# Patient Record
Sex: Female | Born: 1945
Health system: Southern US, Community
[De-identification: ages and names within clinical notes are randomized; demographics above are authoritative.]

## PROBLEM LIST (undated history)

## (undated) ENCOUNTER — Emergency Department (HOSPITAL_COMMUNITY): Payer: Medicare Other | Source: Home / Self Care

## (undated) DIAGNOSIS — M1990 Unspecified osteoarthritis, unspecified site: Secondary | ICD-10-CM

## (undated) DIAGNOSIS — Z9221 Personal history of antineoplastic chemotherapy: Secondary | ICD-10-CM

## (undated) DIAGNOSIS — Z974 Presence of external hearing-aid: Secondary | ICD-10-CM

## (undated) DIAGNOSIS — K5900 Constipation, unspecified: Secondary | ICD-10-CM

## (undated) DIAGNOSIS — Z8489 Family history of other specified conditions: Secondary | ICD-10-CM

## (undated) DIAGNOSIS — K219 Gastro-esophageal reflux disease without esophagitis: Secondary | ICD-10-CM

## (undated) DIAGNOSIS — J45909 Unspecified asthma, uncomplicated: Secondary | ICD-10-CM

## (undated) DIAGNOSIS — M5430 Sciatica, unspecified side: Secondary | ICD-10-CM

## (undated) DIAGNOSIS — Z973 Presence of spectacles and contact lenses: Secondary | ICD-10-CM

## (undated) DIAGNOSIS — I639 Cerebral infarction, unspecified: Secondary | ICD-10-CM

## (undated) DIAGNOSIS — F32A Depression, unspecified: Secondary | ICD-10-CM

## (undated) DIAGNOSIS — F329 Major depressive disorder, single episode, unspecified: Secondary | ICD-10-CM

## (undated) DIAGNOSIS — F419 Anxiety disorder, unspecified: Secondary | ICD-10-CM

## (undated) DIAGNOSIS — J449 Chronic obstructive pulmonary disease, unspecified: Secondary | ICD-10-CM

## (undated) DIAGNOSIS — R51 Headache: Secondary | ICD-10-CM

## (undated) DIAGNOSIS — N189 Chronic kidney disease, unspecified: Secondary | ICD-10-CM

## (undated) DIAGNOSIS — H9190 Unspecified hearing loss, unspecified ear: Secondary | ICD-10-CM

## (undated) DIAGNOSIS — R519 Headache, unspecified: Secondary | ICD-10-CM

## (undated) DIAGNOSIS — I10 Essential (primary) hypertension: Secondary | ICD-10-CM

## (undated) DIAGNOSIS — G589 Mononeuropathy, unspecified: Secondary | ICD-10-CM

## (undated) DIAGNOSIS — R0600 Dyspnea, unspecified: Secondary | ICD-10-CM

## (undated) DIAGNOSIS — C50919 Malignant neoplasm of unspecified site of unspecified female breast: Secondary | ICD-10-CM

## (undated) DIAGNOSIS — C50911 Malignant neoplasm of unspecified site of right female breast: Secondary | ICD-10-CM

## (undated) DIAGNOSIS — A159 Respiratory tuberculosis unspecified: Secondary | ICD-10-CM

## (undated) DIAGNOSIS — I739 Peripheral vascular disease, unspecified: Secondary | ICD-10-CM

## (undated) DIAGNOSIS — S149XXA Injury of unspecified nerves of neck, initial encounter: Secondary | ICD-10-CM

## (undated) DIAGNOSIS — J439 Emphysema, unspecified: Secondary | ICD-10-CM

## (undated) HISTORY — PX: BREAST SURGERY: SHX581

## (undated) HISTORY — DX: Malignant neoplasm of unspecified site of unspecified female breast: C50.919

## (undated) HISTORY — PX: ABDOMINAL HYSTERECTOMY: SHX81

## (undated) HISTORY — PX: OTHER SURGICAL HISTORY: SHX169

## (undated) HISTORY — PX: INNER EAR SURGERY: SHX679

## (undated) HISTORY — DX: Malignant neoplasm of unspecified site of right female breast: C50.911

## (undated) HISTORY — PX: APPENDECTOMY: SHX54

---

## 1999-03-02 ENCOUNTER — Ambulatory Visit (HOSPITAL_COMMUNITY): Admission: RE | Admit: 1999-03-02 | Discharge: 1999-03-02 | Payer: Self-pay | Admitting: Infectious Diseases

## 2003-05-18 ENCOUNTER — Emergency Department (HOSPITAL_COMMUNITY): Admission: EM | Admit: 2003-05-18 | Discharge: 2003-05-18 | Payer: Self-pay | Admitting: Emergency Medicine

## 2003-05-28 ENCOUNTER — Inpatient Hospital Stay (HOSPITAL_COMMUNITY): Admission: EM | Admit: 2003-05-28 | Discharge: 2003-06-07 | Payer: Self-pay | Admitting: *Deleted

## 2011-07-23 ENCOUNTER — Emergency Department (HOSPITAL_COMMUNITY): Payer: Medicare Other

## 2011-07-23 ENCOUNTER — Emergency Department (HOSPITAL_COMMUNITY)
Admission: EM | Admit: 2011-07-23 | Discharge: 2011-07-23 | Disposition: A | Discharge status: Home / Self Care | Payer: Medicare Other | Attending: Emergency Medicine | Admitting: Emergency Medicine

## 2011-07-23 ENCOUNTER — Encounter (HOSPITAL_COMMUNITY): Payer: Self-pay

## 2011-07-23 DIAGNOSIS — M5430 Sciatica, unspecified side: Secondary | ICD-10-CM

## 2011-07-23 DIAGNOSIS — Z8673 Personal history of transient ischemic attack (TIA), and cerebral infarction without residual deficits: Secondary | ICD-10-CM | POA: Insufficient documentation

## 2011-07-23 DIAGNOSIS — I10 Essential (primary) hypertension: Secondary | ICD-10-CM | POA: Insufficient documentation

## 2011-07-23 DIAGNOSIS — F17200 Nicotine dependence, unspecified, uncomplicated: Secondary | ICD-10-CM | POA: Insufficient documentation

## 2011-07-23 DIAGNOSIS — J449 Chronic obstructive pulmonary disease, unspecified: Secondary | ICD-10-CM | POA: Insufficient documentation

## 2011-07-23 DIAGNOSIS — M25559 Pain in unspecified hip: Secondary | ICD-10-CM | POA: Insufficient documentation

## 2011-07-23 DIAGNOSIS — J4489 Other specified chronic obstructive pulmonary disease: Secondary | ICD-10-CM | POA: Insufficient documentation

## 2011-07-23 HISTORY — DX: Essential (primary) hypertension: I10

## 2011-07-23 HISTORY — DX: Chronic obstructive pulmonary disease, unspecified: J44.9

## 2011-07-23 HISTORY — DX: Cerebral infarction, unspecified: I63.9

## 2011-07-23 MED ORDER — OXYCODONE-ACETAMINOPHEN 5-325 MG PO TABS: 1.0000 | ORAL_TABLET | Freq: Four times a day (QID) | ORAL | Status: AC | PRN

## 2011-07-23 NOTE — ED Provider Notes (Signed)
History     CSN: [id number]  Arrival date & time 07/23/11  1115   First MD Initiated Contact with Patient 07/23/11 1212      Chief Complaint  Patient presents with  . Hip Pain    (Consider location/radiation/quality/duration/timing/severity/associated sxs/prior treatment) Patient is a 66 y.o. female presenting with hip pain. The history is provided by the patient.  Hip Pain This is a new problem. Pertinent negatives include no chest pain and no abdominal pain.   patient has had pain in her right hip or back the last 2 weeks. There is no trauma. There is no relief with her Norco at home. It radiates down the back of her right leg. No numbness or weakness. No urinary or fecal incontinence or retention. She states she has a history of a bulging disc. No rash.  Past Medical History  Diagnosis Date  . Stroke   . Hypertension   . COPD (chronic obstructive pulmonary disease)     Past Surgical History  Procedure Date  . Abdominal hysterectomy   . Appendectomy     No family history on file.  History  Substance Use Topics  . Smoking status: Current Everyday Smoker  . Smokeless tobacco: Not on file  . Alcohol Use: No    OB History    Grav Para Term Preterm Abortions TAB SAB Ect Mult Living                  Review of Systems  Constitutional: Negative for fever and chills.  Cardiovascular: Negative for chest pain.  Gastrointestinal: Negative for nausea, abdominal pain, diarrhea, constipation and blood in stool.  Genitourinary: Negative for pelvic pain.  Musculoskeletal: Positive for back pain. Negative for joint swelling, arthralgias and gait problem.  Skin: Negative for pallor and rash.    Allergies  Review of patient's allergies indicates no known allergies.  Home Medications   Current Outpatient Rx  Name Route Sig Dispense Refill  . GOODY HEADACHE PO Oral Take 1 Package by mouth daily as needed. For headache    . CITALOPRAM HYDROBROMIDE 20 MG PO TABS Oral Take  20 mg by mouth daily.    Marland Kitchen HYDROCODONE-ACETAMINOPHEN 5-325 MG PO TABS Oral Take 1 tablet by mouth every 6 (six) hours as needed. For pain    . OXYCODONE-ACETAMINOPHEN 5-325 MG PO TABS Oral Take 1-2 tablets by mouth every 6 (six) hours as needed for pain. 15 tablet 0    BP 169/81  Pulse 85  Temp(Src) 97.6 F (36.4 C) (Oral)  Resp 16  Ht 5\' 2"  (1.575 m)  Wt 170 lb (77.111 kg)  BMI 31.09 kg/m2  SpO2 95%  Physical Exam  Nursing note and vitals reviewed. Constitutional: She is oriented to person, place, and time. She appears well-developed and well-nourished.  HENT:  Head: Normocephalic and atraumatic.  Eyes: EOM are normal. Pupils are equal, round, and reactive to light.  Neck: Normal range of motion. Neck supple.  Cardiovascular: Normal rate, regular rhythm and normal heart sounds.   No murmur heard. Pulmonary/Chest: Effort normal and breath sounds normal. No respiratory distress. She has no wheezes. She has no rales.  Abdominal: Soft. Bowel sounds are normal. She exhibits no distension. There is no tenderness. There is no rebound and no guarding.  Musculoskeletal: Normal range of motion.       Mild tenderness in the right SI joint area. No rash. Crepitance or deformity. Range of motion intact of right hip.  Neurological: She is alert and oriented  to person, place, and time. No cranial nerve deficit.  Skin: Skin is warm and dry.  Psychiatric: She has a normal mood and affect. Her speech is normal.    ED Course  Procedures (including critical care time)  Labs Reviewed - No data to display Dg Pelvis 1-2 Views  07/23/2011  *RADIOLOGY REPORT*  Clinical Data: Right hip pain.  PELVIS - 1-2 VIEW  Comparison: CT 05/28/2003  Findings: No acute bony abnormality.  Specifically, no fracture, subluxation, or dislocation.  Soft tissues are intact.  SI joints and hip joints are symmetric and unremarkable.  IMPRESSION: No acute bony abnormality.  Original Report Authenticated By: Cyndie Chime,  M.D.     1. Sciatica       MDM  Right hip/lower back pain. Tender over sciatic area. Negative pelvic x-ray. She be discharged home with pain meds and followup as needed.        Juliet Rude. Rubin Payor, MD 07/23/11 1331

## 2011-07-23 NOTE — ED Notes (Signed)
Rt. Hip pain that radiates into rt. Leg. Began about 2 weeks ago, denies any injury, denies any numbness or tingling

## 2011-07-23 NOTE — ED Notes (Signed)
Rt hip pain x 2 weeks no injury she states hurts to walk

## 2011-07-23 NOTE — Discharge Instructions (Signed)

## 2014-04-05 DIAGNOSIS — F331 Major depressive disorder, recurrent, moderate: Secondary | ICD-10-CM | POA: Diagnosis not present

## 2014-04-05 DIAGNOSIS — Z720 Tobacco use: Secondary | ICD-10-CM | POA: Diagnosis not present

## 2014-04-05 DIAGNOSIS — I10 Essential (primary) hypertension: Secondary | ICD-10-CM | POA: Diagnosis not present

## 2014-04-05 DIAGNOSIS — Z716 Tobacco abuse counseling: Secondary | ICD-10-CM | POA: Diagnosis not present

## 2015-03-26 DIAGNOSIS — M5430 Sciatica, unspecified side: Secondary | ICD-10-CM | POA: Diagnosis not present

## 2015-03-26 DIAGNOSIS — I10 Essential (primary) hypertension: Secondary | ICD-10-CM | POA: Diagnosis not present

## 2015-03-26 DIAGNOSIS — F331 Major depressive disorder, recurrent, moderate: Secondary | ICD-10-CM | POA: Diagnosis not present

## 2015-03-26 DIAGNOSIS — L0291 Cutaneous abscess, unspecified: Secondary | ICD-10-CM | POA: Diagnosis not present

## 2015-06-15 DIAGNOSIS — R7301 Impaired fasting glucose: Secondary | ICD-10-CM | POA: Diagnosis not present

## 2015-06-15 DIAGNOSIS — E782 Mixed hyperlipidemia: Secondary | ICD-10-CM | POA: Diagnosis not present

## 2015-06-15 DIAGNOSIS — I10 Essential (primary) hypertension: Secondary | ICD-10-CM | POA: Diagnosis not present

## 2015-06-17 DIAGNOSIS — F331 Major depressive disorder, recurrent, moderate: Secondary | ICD-10-CM | POA: Diagnosis not present

## 2015-06-17 DIAGNOSIS — R7301 Impaired fasting glucose: Secondary | ICD-10-CM | POA: Diagnosis not present

## 2015-06-17 DIAGNOSIS — Z720 Tobacco use: Secondary | ICD-10-CM | POA: Diagnosis not present

## 2015-06-17 DIAGNOSIS — I10 Essential (primary) hypertension: Secondary | ICD-10-CM | POA: Diagnosis not present

## 2015-06-17 DIAGNOSIS — H9193 Unspecified hearing loss, bilateral: Secondary | ICD-10-CM | POA: Diagnosis not present

## 2015-06-17 DIAGNOSIS — E782 Mixed hyperlipidemia: Secondary | ICD-10-CM | POA: Diagnosis not present

## 2015-09-22 DIAGNOSIS — R7301 Impaired fasting glucose: Secondary | ICD-10-CM | POA: Diagnosis not present

## 2015-09-22 DIAGNOSIS — E782 Mixed hyperlipidemia: Secondary | ICD-10-CM | POA: Diagnosis not present

## 2015-11-08 DIAGNOSIS — L02212 Cutaneous abscess of back [any part, except buttock]: Secondary | ICD-10-CM | POA: Diagnosis not present

## 2016-01-31 DIAGNOSIS — N63 Unspecified lump in breast: Secondary | ICD-10-CM | POA: Diagnosis not present

## 2016-01-31 DIAGNOSIS — M158 Other polyosteoarthritis: Secondary | ICD-10-CM | POA: Diagnosis not present

## 2016-01-31 DIAGNOSIS — Z23 Encounter for immunization: Secondary | ICD-10-CM | POA: Diagnosis not present

## 2016-02-15 ENCOUNTER — Other Ambulatory Visit (HOSPITAL_COMMUNITY): Payer: Self-pay | Admitting: Internal Medicine

## 2016-02-15 DIAGNOSIS — N6310 Unspecified lump in the right breast, unspecified quadrant: Secondary | ICD-10-CM

## 2016-02-15 DIAGNOSIS — N63 Unspecified lump in unspecified breast: Secondary | ICD-10-CM

## 2016-02-21 ENCOUNTER — Ambulatory Visit (HOSPITAL_COMMUNITY)
Admission: RE | Admit: 2016-02-21 | Discharge: 2016-02-21 | Disposition: A | Discharge status: Home / Self Care | Payer: Medicare Other | Source: Ambulatory Visit | Attending: Internal Medicine | Admitting: Internal Medicine

## 2016-02-21 ENCOUNTER — Other Ambulatory Visit (HOSPITAL_COMMUNITY): Payer: Self-pay | Admitting: Internal Medicine

## 2016-02-21 DIAGNOSIS — N6310 Unspecified lump in the right breast, unspecified quadrant: Secondary | ICD-10-CM

## 2016-02-21 DIAGNOSIS — R590 Localized enlarged lymph nodes: Secondary | ICD-10-CM | POA: Diagnosis not present

## 2016-02-21 DIAGNOSIS — N6311 Unspecified lump in the right breast, upper outer quadrant: Secondary | ICD-10-CM | POA: Insufficient documentation

## 2016-02-27 ENCOUNTER — Other Ambulatory Visit (HOSPITAL_COMMUNITY): Payer: Self-pay | Admitting: Internal Medicine

## 2016-02-27 DIAGNOSIS — N6310 Unspecified lump in the right breast, unspecified quadrant: Secondary | ICD-10-CM

## 2016-02-27 DIAGNOSIS — R599 Enlarged lymph nodes, unspecified: Secondary | ICD-10-CM

## 2016-02-28 ENCOUNTER — Ambulatory Visit (HOSPITAL_COMMUNITY)
Admission: RE | Admit: 2016-02-28 | Discharge: 2016-02-28 | Disposition: A | Discharge status: Home / Self Care | Payer: Medicare Other | Source: Ambulatory Visit | Attending: Internal Medicine | Admitting: Internal Medicine

## 2016-02-28 ENCOUNTER — Other Ambulatory Visit (HOSPITAL_COMMUNITY): Payer: Self-pay | Admitting: Internal Medicine

## 2016-02-28 ENCOUNTER — Encounter (HOSPITAL_COMMUNITY): Payer: Self-pay

## 2016-02-28 DIAGNOSIS — N6310 Unspecified lump in the right breast, unspecified quadrant: Secondary | ICD-10-CM

## 2016-02-28 DIAGNOSIS — R590 Localized enlarged lymph nodes: Secondary | ICD-10-CM | POA: Insufficient documentation

## 2016-02-28 DIAGNOSIS — C50411 Malignant neoplasm of upper-outer quadrant of right female breast: Secondary | ICD-10-CM | POA: Insufficient documentation

## 2016-02-28 DIAGNOSIS — R599 Enlarged lymph nodes, unspecified: Secondary | ICD-10-CM

## 2016-02-28 DIAGNOSIS — C773 Secondary and unspecified malignant neoplasm of axilla and upper limb lymph nodes: Secondary | ICD-10-CM | POA: Insufficient documentation

## 2016-02-28 DIAGNOSIS — N6311 Unspecified lump in the right breast, upper outer quadrant: Secondary | ICD-10-CM | POA: Diagnosis not present

## 2016-02-28 DIAGNOSIS — R897 Abnormal histological findings in specimens from other organs, systems and tissues: Secondary | ICD-10-CM

## 2016-02-28 MED ORDER — LIDOCAINE HCL (PF) 1 % IJ SOLN
INTRAMUSCULAR | Status: AC
  Filled 2016-02-28: qty 20

## 2016-02-28 MED ORDER — SODIUM BICARBONATE 4 % IV SOLN
INTRAVENOUS | Status: AC
  Filled 2016-02-28: qty 5

## 2016-03-07 ENCOUNTER — Encounter (HOSPITAL_COMMUNITY): Payer: Medicare Other

## 2016-03-07 ENCOUNTER — Encounter (HOSPITAL_COMMUNITY): Payer: Medicare Other | Attending: Oncology | Admitting: Oncology

## 2016-03-07 ENCOUNTER — Encounter (HOSPITAL_COMMUNITY): Payer: Self-pay | Admitting: Lab

## 2016-03-07 ENCOUNTER — Encounter (HOSPITAL_COMMUNITY): Payer: Self-pay | Admitting: Oncology

## 2016-03-07 VITALS — BP 144/64 | HR 85 | Temp 98.6°F | Resp 16 | Ht 62.0 in | Wt 188.0 lb

## 2016-03-07 DIAGNOSIS — Z8673 Personal history of transient ischemic attack (TIA), and cerebral infarction without residual deficits: Secondary | ICD-10-CM | POA: Insufficient documentation

## 2016-03-07 DIAGNOSIS — J449 Chronic obstructive pulmonary disease, unspecified: Secondary | ICD-10-CM | POA: Diagnosis not present

## 2016-03-07 DIAGNOSIS — C50411 Malignant neoplasm of upper-outer quadrant of right female breast: Secondary | ICD-10-CM | POA: Diagnosis not present

## 2016-03-07 DIAGNOSIS — Z720 Tobacco use: Secondary | ICD-10-CM

## 2016-03-07 DIAGNOSIS — I10 Essential (primary) hypertension: Secondary | ICD-10-CM | POA: Diagnosis not present

## 2016-03-07 DIAGNOSIS — E785 Hyperlipidemia, unspecified: Secondary | ICD-10-CM | POA: Diagnosis not present

## 2016-03-07 DIAGNOSIS — Z800 Family history of malignant neoplasm of digestive organs: Secondary | ICD-10-CM | POA: Diagnosis not present

## 2016-03-07 DIAGNOSIS — Z9221 Personal history of antineoplastic chemotherapy: Secondary | ICD-10-CM | POA: Diagnosis not present

## 2016-03-07 DIAGNOSIS — Z170 Estrogen receptor positive status [ER+]: Secondary | ICD-10-CM | POA: Diagnosis not present

## 2016-03-07 DIAGNOSIS — Z8052 Family history of malignant neoplasm of bladder: Secondary | ICD-10-CM | POA: Diagnosis not present

## 2016-03-07 DIAGNOSIS — C773 Secondary and unspecified malignant neoplasm of axilla and upper limb lymph nodes: Secondary | ICD-10-CM

## 2016-03-07 DIAGNOSIS — C50911 Malignant neoplasm of unspecified site of right female breast: Secondary | ICD-10-CM

## 2016-03-07 DIAGNOSIS — Z9889 Other specified postprocedural states: Secondary | ICD-10-CM | POA: Insufficient documentation

## 2016-03-07 DIAGNOSIS — F17210 Nicotine dependence, cigarettes, uncomplicated: Secondary | ICD-10-CM | POA: Insufficient documentation

## 2016-03-07 HISTORY — DX: Malignant neoplasm of unspecified site of right female breast: C50.911

## 2016-03-07 LAB — COMPREHENSIVE METABOLIC PANEL
ALT: 35 U/L (ref 14–54)
ANION GAP: 8 (ref 5–15)
AST: 31 U/L (ref 15–41)
Albumin: 4.3 g/dL (ref 3.5–5.0)
Alkaline Phosphatase: 87 U/L (ref 38–126)
BUN: 11 mg/dL (ref 6–20)
CO2: 28 mmol/L (ref 22–32)
CREATININE: 0.94 mg/dL (ref 0.44–1.00)
Calcium: 9.3 mg/dL (ref 8.9–10.3)
Chloride: 98 mmol/L — ABNORMAL LOW (ref 101–111)
GFR calc Af Amer: >60 mL/min (ref >60)
GFR calc non Af Amer: >60 mL/min (ref >60)
Glucose, Bld: 138 mg/dL — ABNORMAL HIGH (ref 65–99)
Potassium: 3.7 mmol/L (ref 3.5–5.1)
Sodium: 134 mmol/L — ABNORMAL LOW (ref 135–145)
TOTAL PROTEIN: 7.3 g/dL (ref 6.5–8.1)
Total Bilirubin: 0.4 mg/dL (ref 0.3–1.2)

## 2016-03-07 LAB — CBC WITH DIFFERENTIAL/PLATELET
Basophils Absolute: 0 10*3/uL (ref 0.0–0.1)
Basophils Relative: 1 %
EOS PCT: 3 %
Eosinophils Absolute: 0.2 10*3/uL (ref 0.0–0.7)
HCT: 42.8 % (ref 36.0–46.0)
Hemoglobin: 15.3 g/dL — ABNORMAL HIGH (ref 12.0–15.0)
LYMPHS ABS: 2.6 10*3/uL (ref 0.7–4.0)
Lymphocytes Relative: 30 %
MCH: 32 pg (ref 26.0–34.0)
MCHC: 35.7 g/dL (ref 30.0–36.0)
MCV: 89.5 fL (ref 78.0–100.0)
Monocytes Absolute: 0.7 10*3/uL (ref 0.1–1.0)
Monocytes Relative: 8 %
Neutro Abs: 5.3 10*3/uL (ref 1.7–7.7)
Neutrophils Relative %: 60 %
Platelets: 220 10*3/uL (ref 150–400)
RBC: 4.78 MIL/uL (ref 3.87–5.11)
RDW: 13.4 % (ref 11.5–15.5)
WBC: 8.8 10*3/uL (ref 4.0–10.5)

## 2016-03-07 NOTE — Assessment & Plan Note (Addendum)
Invasive ductal carcinoma of right breast ER/PR +, HER2 NEGATIVE, with right axillary lymph node positive for disease.  Oncology history developed.  I personally reviewed and went over radiographic studies with the patient.  The results are noted within this dictation.    I personally reviewed and went over pathology results with the patient.  She is provided patient information/education regarding breast cancer.  She is educated on her type of breast cancer, ER/PR+, HER2 NEGATIVE.  She is educated invasive ductal carcinoma.  Order placed for breast MRI to complete staging and help plan for future surgical/medical oncology recommendations.  To complete staging, orders are placed for CT CAP w contrast.  Labs today: CBC diff, CMET (to confirm renal function is adequate for IV contrast for CT scans).  Will refer to Dr. Donne Hazel for surgical consultation.  Of note, the patient does NOT want mastectomy and therefore, she is interested in neoadjuvant treatment in an attempt to save breast and pursue lumpectomy (potentially).  She is very clear that quality of life is of utmost importance.  Patient will return in 2 weeks for follow-up.

## 2016-03-07 NOTE — Progress Notes (Signed)
[facility] Hematology/Oncology Consultation   Name: Ashley Doyle      MRN: [id number]    Date: 03/07/2016 Time:1:22 PM   REFERRING PHYSICIAN:  Wende Neighbors, MD (Primary Care Provider)  REASON FOR CONSULT:  Grade 2 invasive ductal carcinoma   DIAGNOSIS:  Invasive ductal carcinoma of right breast ER/PR +, HER2 NEGATIVE, with right axillary lymph node positive for disease.    Invasive ductal carcinoma of breast, female, right ([place])   02/21/2016 Mammogram    Irregular vascular mass in the right breast at 10 o'clock, 10 cm the nipple, measuring 2.9 x 2.5 x 2.6 cm. In the right axilla there is 3 discrete enlarged abnormal appearing lymph nodes. Largest, which lies lateral to the palpable mass, measures 1.9 x 1.3 x 1.9 cm.      02/28/2016 Procedure    Right needle core biopsy in 10:00 position and right axillary biopsy      02/28/2016 Procedure    Postprocedure mammogram for clip placement. Ribbon shaped clip well-positioned within the biopsied mass in the right breast at the 10 o'clock axis. Wing shaped clip is well-positioned within the biopsied lymph node as seen on postprocedure ultrasound evaluation.  Final Assessment: Post Procedure Mammograms for Marker Placement      03/01/2016 Pathology Results    Invasive ductal carcinoma with extracellular mucin of right breast mass and ductal carcinoma of right axillary lymph node.  Grade 2.  ER 100%, PR 100%, Ki-67 12%, HER2 NEGATIVE.      HISTORY OF PRESENT ILLNESS:   Ashley Doyle is a 70 y.o. female with a medical history significant for tobacco abuse, hyperlipidemia, major depressive disorder, scistica, dysthymic disorder, esstenial HTN,  who is referred to the [facility] for invasive ductal carcinoma of right breast ER/PR +, HER2 NEGATIVE, with right axillary lymph node positive for disease.  She reports a strong history of "cysts" and "abscesses" in the past.  She reports a right breast  mass she noted 6-7 months ago.  She reports that it grew over time.  As a result, he saw her primary care provider who ordered a mammogram.  Mammogram was suspicious for breast cancer and therefore further work-up included [place] and biopsy. She denies prior mammograpy.  She notes right breast tenderness due to recent biopsy.  She reports a good appetite without any unintentional weight loss.  She refuses mastectomy as part of her treatment plan.  She is provided education regarding lumpectomy.  This seems to be helpful to the patient.  She reports "I will do chemotherapy or radiation, but I will NOT have my breast removed." Initially she was not even open to any surgery.  Her husband is very fond of medical treatment in [place] as he underwent chemotherapy and XRT for a laryngeal carcinoma at [facility].    Review of Systems  Constitutional: Negative.  Negative for chills, fever and weight loss.  HENT: Positive for hearing loss.   Eyes: Negative.  Negative for blurred vision and double vision.  Respiratory: Negative.  Negative for cough.   Cardiovascular: Negative.  Negative for chest pain.  Gastrointestinal: Negative.  Negative for constipation, diarrhea, nausea and vomiting.  Genitourinary: Negative.   Musculoskeletal: Negative.   Skin: Negative.   Neurological: Negative.   Endo/Heme/Allergies: Negative.   Psychiatric/Behavioral: Negative.   14 point review of systems was performed and is negative except as detailed under history of present illness and above   PAST MEDICAL  HISTORY:   Past Medical History:  Diagnosis Date  . Breast cancer ([place])   . COPD (chronic obstructive pulmonary disease) ([place])   . Hypertension   . Invasive ductal carcinoma of breast, female, right ([place]) 03/07/2016  . Stroke [facility])     ALLERGIES: No Known Allergies    MEDICATIONS: I have reviewed the patient's current medications.    No current outpatient prescriptions on file prior to visit.   No current  facility-administered medications on file prior to visit.      PAST SURGICAL HISTORY Past Surgical History:  Procedure Laterality Date  . ABDOMINAL HYSTERECTOMY    . APPENDECTOMY    . INNER EAR SURGERY Right     FAMILY HISTORY: She reports a family history of cancer:  Bother deceased at the age of 53 secondary to colon cancer  Brother is survivor of bladder cancer  Sister is survivor with history of colon cancer requiring colectomy.  1 son is deceased at the age of 67 secondary to complications with cerebral palsy 1 son alive, living in [place].  He is a Curator. 1 Grandson 1 Great-granddaughter.  SOCIAL HISTORY:  reports that she has been smoking Cigarettes.  She has a 108.00 pack-year smoking history. She has never used smokeless tobacco. She reports that she does not drink alcohol or use drugs.   She is Psychologist, forensic in religion.  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  She is married x 24 years to current husband who is a cancer survivor himself of head and neck cancer treated by Dr. Isidore Moos (XRT) and Dr. Alvy Bimler (Medical Oncology); currently 1.5 year survivor.  Social History   Social History  . Marital status: Married    Spouse name: N/A  . Number of children: N/A  . Years of education: N/A   Social History Main Topics  . Smoking status: Current Every Day Smoker    Packs/day: 2.00    Years: 54.00    Types: Cigarettes  . Smokeless tobacco: Never Used  . Alcohol use No  . Drug use: No  . Sexual activity: Not on file     Comment: hysterectomy   Other Topics Concern  . Not on file   Social History Narrative  . No narrative on file    PERFORMANCE STATUS: The patient's performance status is 1 - Symptomatic but completely ambulatory  PHYSICAL EXAM: Most Recent Vital Signs: Blood pressure (!) 144/64, pulse 85, temperature 98.6 F (37 C), temperature source Oral, resp. rate 16, height '5\' 2"'  (1.575 m), weight 188 lb (85.3 kg), SpO2 95 %. General  appearance: alert, cooperative, appears older than stated age, no distress, moderately obese and accompanied by her husband Head: Normocephalic, without obvious abnormality, atraumatic Eyes: negative findings: lids and lashes normal, conjunctivae and sclerae normal and corneas clear Throat: abnormal findings: dentition: upper dentures and edentulous Neck: no adenopathy, supple, symmetrical, trachea midline and thyroid not enlarged, symmetric, no tenderness/mass/nodules Lungs: clear to auscultation bilaterally and normal percussion bilaterally Breasts: negative findings: left breast is negative for any masses/lesions or skin changes., positive findings: right breast with palpable lesion in 10:00 position and axillary lymph node (difficult to palpate due to post-biopsy tenderness) large ecchymosis Heart: regular rate and rhythm, S1, S2 normal, no murmur, click, rub or gallop Abdomen: soft, non-tender; bowel sounds normal; no masses,  no organomegaly Extremities: extremities normal, atraumatic, no cyanosis or edema Skin: Skin color, texture, turgor normal. No rashes or lesions Lymph nodes: Axillary adenopathy: as noted above  on right Neurologic: Grossly normal  LABORATORY DATA:  No results found for this or any previous visit (from the past 48 hour(s)).    RADIOGRAPHY: ADDENDUM REPORT: 03/01/2016 14:04  ADDENDUM: Pathology of the right breat biopsy, 10:00 position revealed INVASIVE DUCTAL CARCINOMA WITH EXTRACELLULAR MUCIN. Microscopic Comment: The findings are consistent with grade 2 invasive ductal carcinoma. Breast prognostic profile will be performed.  Pathology of the right axillary lymph node revealed DUCTAL CARCINOMA. Microscopic Comment: There is invasive ductal carcinoma without lymph node tissue and this likely represents a lymph node replaced by tumor. Dr. Olena Leatherwood.  Both biopsies found to be concordant by Dr. Enriqueta Shutter.  Recommendation:  Surgical and oncology  referral.  Results and recommendations were relayed to Dr. Juel Burrow nurse by Jetta Lout, [facility]) on 03/01/16. She asked that Dr. Enriqueta Shutter discuss results with the patient and their office will make the referral. They will contact the patient with referral information.  At the patient's request, pathology and recommendations were relayed to the patient by phone by Dr. Enriqueta Shutter on 03/01/16. She stated she has done well following the biopsy with only mild tenderness. Post biopsy instructions were reviewed with the patient. All of her questions were answered. She was informed that referrals would be made by Dr. Nevada Crane and his office would contact her. She was encouraged to call the imaging department of [facility] with any further questions or concerns.  Addendum by Jetta Lout, RRA on 03/01/16.   Electronically Signed   By: Franki Cabot M.D.   On: 03/01/2016 14:04   PATHOLOGY:   Diagnosis 1. Breast, right, needle core biopsy, 10:00 - INVASIVE DUCTAL CARCINOMA WITH EXTRACELLULAR MUCIN. Estrogen Receptor: 100%, POSITIVE, STRONG STAINING INTENSITY Progesterone Receptor: 100%, POSITIVE, STRONG STAINING INTENSITY Proliferation Marker Ki67: 12% HER2 - NEGATIVE  2. Lymph node, needle/core biopsy, right axilla - DUCTAL CARCINOMA. - SEE MICROSCOPIC DESCRIPTION. Estrogen Receptor: 100%, POSITIVE, STRONG STAINING INTENSITY Progesterone Receptor: 100%, POSITIVE, STRONG STAINING INTENSITY Proliferation Marker Ki67: 15% HER2 - NEGATIVE  ASSESSMENT/PLAN:  Invasive ductal carcinoma of breast, female, right upper outer quadrant ER/PR +, HER2 NEGATIVE  Right axillary lymph node positive for disease Tobacco Abuse  We spent a significant amount of time first discussing types of breast cancer. I then proceeded to educate the patient on appropriate treatment of breast cancer which includes surgery.  I advised her that I feel she would most likely be able to avoid  mastectomy.Will refer to Dr. Donne Hazel for surgical consultation.  Of note, the patient does NOT want mastectomy and therefore, she is interested in neoadjuvant treatment in an attempt to save breast and pursue lumpectomy.  She is very clear that quality of life is of utmost importance.  Will discuss final treatment planning with Dr. Donne Hazel once he has seen the patient in consultation. Note that she may also be a candidate for surgical therapy up front.     Oncology history developed.  I personally reviewed and went over radiographic studies with the patient.  The results are noted within this dictation.    I personally reviewed and went over pathology results with the patient.  She is provided patient information/education regarding breast cancer.  She is educated on her type of breast cancer, ER/PR+, HER2 NEGATIVE.  She is educated invasive ductal carcinoma.  Order placed for breast MRI to complete staging and help plan for future surgical/medical oncology recommendations.  To complete staging, orders are placed for CT CAP w contrast.  Labs today: CBC diff, CMET (  to confirm renal function is adequate for IV contrast for CT scans).  I addressed the importance of smoking cessation with the patient in detail.  We discussed the health benefits of cessation.  We discussed the health detriments of ongoing tobacco use including but not limited to COPD, heart disease and malignancy. We reviewed the multiple options for cessation and I offered to refer her to smoking cessation classes. We discussed other alternatives to quit such as chantix, wellbutrin. We will continue to address this moving forward.  Patient will return in 2 weeks for follow-up.  She has met with our patient navigator, Anderson Malta today.   ORDERS PLACED FOR THIS ENCOUNTER: Orders Placed This Encounter  Procedures  . CT Abdomen Pelvis W Contrast  . CT Chest W Contrast  . MR BREAST BILATERAL W WO CONTRAST  . CBC with  Differential  . Comprehensive metabolic panel    MEDICATIONS PRESCRIBED THIS ENCOUNTER:   All questions were answered. The patient knows to call the clinic with any problems, questions or concerns. We can certainly see the patient much sooner if necessary.  This note is electronically signed IH:[id number],GLOVFIE Cyril Mourning, MD  03/07/2016 1:22 PM

## 2016-03-07 NOTE — Progress Notes (Signed)
Met with pt face to face.  Introduced myself and explain a little bit about my role as the patient navigator.  Pt given a card with all my information on it.  Told pt to call if they had any questions or concerns.  Pt verbalized understanding.

## 2016-03-07 NOTE — Patient Instructions (Signed)
[place] at [facility] Discharge Instructions  RECOMMENDATIONS MADE BY THE CONSULTANT AND ANY TEST RESULTS WILL BE SENT TO YOUR REFERRING PHYSICIAN.  You were seen today by Kirby Crigler PA-C and Dr. Whitney Muse. You will be scheduled for an MRI and a CT scan. He is referring you to Dr. Donne Hazel. Follow up in 2 weeks with Dr. Whitney Muse.   Thank you for choosing [place] at [facility] to provide your oncology and hematology care.  To afford each patient quality time with our provider, please arrive at least 15 minutes before your scheduled appointment time.   Beginning January 23rd 2017 lab work for the [facility] will be done in the  Main lab at [facility] on 1st floor. If you have a lab appointment with the [facility] please come in thru the  Main Entrance and check in at the main information desk  You need to re-schedule your appointment should you arrive 10 or more minutes late.  We strive to give you quality time with our providers, and arriving late affects you and other patients whose appointments are after yours.  Also, if you no show three or more times for appointments you may be dismissed from the clinic at the providers discretion.     Again, thank you for choosing [facility].  Our hope is that these requests will decrease the amount of time that you wait before being seen by our physicians.       _____________________________________________________________  Should you have questions after your visit to [facility], please contact our office at [phone] between the hours of 8:30 a.m. and 4:30 p.m.  Voicemails left after 4:30 p.m. will not be returned until the following business day.  For prescription refill requests, have your pharmacy contact our office.         Resources For Cancer Patients and their Caregivers ? American Cancer Society: Can assist with transportation, wigs, general  needs, runs Look Good Feel Better.        931-444-3886 ? Cancer Care: Provides financial assistance, online support groups, medication/co-pay assistance.  1-800-813-HOPE 5074219375) ? [facility] Assists [place] cancer patients and their families through emotional , educational and financial support.  907-518-7530 ? [place] DSS Where to apply for food stamps, Medicaid and utility assistance. 9360344774 ? [facility]: Transportation to medical appointments. 205-461-9745 ? Social Security Administration: May apply for disability if have a Stage IV cancer. 867-082-4823 (984) 612-2967 ? [employer], Disability and Transit Services: Assists with nutrition, care and transit needs. [place] Support Programs: @10RELATIVEDAYS @ > Cancer Support Group  2nd Tuesday of the month 1pm-2pm, Journey Room  > Creative Journey  3rd Tuesday of the month 1130am-1pm, Journey Room  > Look Good Feel Better  1st Wednesday of the month 10am-12 noon, Journey Room (Call [place] to register [phone])

## 2016-03-07 NOTE — Progress Notes (Unsigned)
Referral to Dr Donne Hazel.  Records faxed on 10/25

## 2016-03-09 ENCOUNTER — Ambulatory Visit (HOSPITAL_COMMUNITY)
Admission: RE | Admit: 2016-03-09 | Discharge: 2016-03-09 | Disposition: A | Discharge status: Home / Self Care | Payer: Medicare Other | Source: Ambulatory Visit | Attending: Oncology | Admitting: Oncology

## 2016-03-09 DIAGNOSIS — C50911 Malignant neoplasm of unspecified site of right female breast: Secondary | ICD-10-CM | POA: Diagnosis not present

## 2016-03-09 DIAGNOSIS — K760 Fatty (change of) liver, not elsewhere classified: Secondary | ICD-10-CM | POA: Diagnosis not present

## 2016-03-09 DIAGNOSIS — I700 Atherosclerosis of aorta: Secondary | ICD-10-CM | POA: Insufficient documentation

## 2016-03-09 MED ORDER — IOPAMIDOL (ISOVUE-300) INJECTION 61%
100.0000 mL | Freq: Once | INTRAVENOUS | Status: AC | PRN
  Administered 2016-03-09: 100 mL via INTRAVENOUS

## 2016-03-11 ENCOUNTER — Encounter (HOSPITAL_COMMUNITY): Payer: Self-pay | Admitting: Oncology

## 2016-03-11 DIAGNOSIS — Z720 Tobacco use: Secondary | ICD-10-CM | POA: Insufficient documentation

## 2016-03-12 ENCOUNTER — Encounter (HOSPITAL_COMMUNITY): Payer: Self-pay

## 2016-03-12 ENCOUNTER — Ambulatory Visit (HOSPITAL_COMMUNITY)
Admission: RE | Admit: 2016-03-12 | Discharge: 2016-03-12 | Disposition: A | Discharge status: Home / Self Care | Payer: Medicare Other | Source: Ambulatory Visit | Attending: Oncology | Admitting: Oncology

## 2016-03-12 ENCOUNTER — Other Ambulatory Visit (HOSPITAL_COMMUNITY): Payer: Self-pay | Admitting: Oncology

## 2016-03-12 ENCOUNTER — Telehealth (HOSPITAL_COMMUNITY): Payer: Self-pay | Admitting: *Deleted

## 2016-03-12 DIAGNOSIS — C50911 Malignant neoplasm of unspecified site of right female breast: Secondary | ICD-10-CM

## 2016-03-12 DIAGNOSIS — Z01818 Encounter for other preprocedural examination: Secondary | ICD-10-CM | POA: Diagnosis not present

## 2016-03-12 NOTE — Telephone Encounter (Signed)
-----   Message from [facility], PA-C sent at 03/12/2016  4:53 PM EDT ----- Negative for metastatic disease.

## 2016-03-14 ENCOUNTER — Telehealth (HOSPITAL_COMMUNITY): Payer: Self-pay | Admitting: Emergency Medicine

## 2016-03-14 NOTE — Telephone Encounter (Signed)
Results of CT scans went over with pt.  Pt states that she is having left sided pain that is radiating down into her left thigh and her pain medication is not helping.  I told her that she needed to go to the ER because they could better assess her there.  She verbalized understanding.

## 2016-03-21 ENCOUNTER — Ambulatory Visit (HOSPITAL_COMMUNITY): Payer: Medicare Other | Admitting: Hematology & Oncology

## 2016-03-25 ENCOUNTER — Ambulatory Visit
Admission: RE | Admit: 2016-03-25 | Discharge: 2016-03-25 | Disposition: A | Discharge status: Home / Self Care | Payer: Medicare Other | Source: Ambulatory Visit | Attending: Oncology | Admitting: Oncology

## 2016-03-25 DIAGNOSIS — C50911 Malignant neoplasm of unspecified site of right female breast: Secondary | ICD-10-CM

## 2016-03-25 DIAGNOSIS — D0511 Intraductal carcinoma in situ of right breast: Secondary | ICD-10-CM | POA: Diagnosis not present

## 2016-03-25 MED ORDER — GADOBENATE DIMEGLUMINE 529 MG/ML IV SOLN
17.0000 mL | Freq: Once | INTRAVENOUS | Status: AC | PRN
  Administered 2016-03-25: 17 mL via INTRAVENOUS

## 2016-03-26 ENCOUNTER — Telehealth (HOSPITAL_COMMUNITY): Payer: Self-pay | Admitting: Emergency Medicine

## 2016-03-26 NOTE — Telephone Encounter (Signed)
Left a message with husband lee for pt to call me back about getting her appt re-scheduled to see Dr Whitney Muse.  He verbalized understanding.  He said that she was laying down resting.

## 2016-03-27 ENCOUNTER — Encounter (HOSPITAL_COMMUNITY): Payer: Medicare Other | Attending: Oncology | Admitting: Hematology & Oncology

## 2016-03-27 ENCOUNTER — Encounter (HOSPITAL_COMMUNITY): Payer: Self-pay | Admitting: Hematology & Oncology

## 2016-03-27 VITALS — BP 150/73 | HR 85 | Temp 98.0°F | Resp 18 | Wt 185.6 lb

## 2016-03-27 DIAGNOSIS — Z170 Estrogen receptor positive status [ER+]: Secondary | ICD-10-CM | POA: Diagnosis not present

## 2016-03-27 DIAGNOSIS — C773 Secondary and unspecified malignant neoplasm of axilla and upper limb lymph nodes: Secondary | ICD-10-CM

## 2016-03-27 DIAGNOSIS — Z8673 Personal history of transient ischemic attack (TIA), and cerebral infarction without residual deficits: Secondary | ICD-10-CM | POA: Insufficient documentation

## 2016-03-27 DIAGNOSIS — I10 Essential (primary) hypertension: Secondary | ICD-10-CM | POA: Insufficient documentation

## 2016-03-27 DIAGNOSIS — R4589 Other symptoms and signs involving emotional state: Secondary | ICD-10-CM

## 2016-03-27 DIAGNOSIS — E785 Hyperlipidemia, unspecified: Secondary | ICD-10-CM | POA: Insufficient documentation

## 2016-03-27 DIAGNOSIS — Z9221 Personal history of antineoplastic chemotherapy: Secondary | ICD-10-CM | POA: Insufficient documentation

## 2016-03-27 DIAGNOSIS — K760 Fatty (change of) liver, not elsewhere classified: Secondary | ICD-10-CM

## 2016-03-27 DIAGNOSIS — Z800 Family history of malignant neoplasm of digestive organs: Secondary | ICD-10-CM | POA: Insufficient documentation

## 2016-03-27 DIAGNOSIS — J449 Chronic obstructive pulmonary disease, unspecified: Secondary | ICD-10-CM | POA: Insufficient documentation

## 2016-03-27 DIAGNOSIS — Z8052 Family history of malignant neoplasm of bladder: Secondary | ICD-10-CM | POA: Insufficient documentation

## 2016-03-27 DIAGNOSIS — C50411 Malignant neoplasm of upper-outer quadrant of right female breast: Secondary | ICD-10-CM | POA: Diagnosis not present

## 2016-03-27 DIAGNOSIS — F418 Other specified anxiety disorders: Secondary | ICD-10-CM

## 2016-03-27 DIAGNOSIS — Z720 Tobacco use: Secondary | ICD-10-CM

## 2016-03-27 DIAGNOSIS — C50911 Malignant neoplasm of unspecified site of right female breast: Secondary | ICD-10-CM

## 2016-03-27 DIAGNOSIS — Z9889 Other specified postprocedural states: Secondary | ICD-10-CM | POA: Insufficient documentation

## 2016-03-27 DIAGNOSIS — F17210 Nicotine dependence, cigarettes, uncomplicated: Secondary | ICD-10-CM | POA: Insufficient documentation

## 2016-03-27 NOTE — Progress Notes (Signed)
[facility] Hematology/Oncology Consultation   Name: Ashley Doyle      MRN: [id number]    Date: 03/27/2016 Time:3:30 PM   REFERRING PHYSICIAN:  Wende Neighbors, MD (Primary Care Provider)  REASON FOR CONSULT:  Grade 2 invasive ductal carcinoma   DIAGNOSIS:  Invasive ductal carcinoma of right breast ER/PR +, HER2 NEGATIVE, with right axillary lymph node positive for disease.    Invasive ductal carcinoma of breast, female, right ([place])   02/21/2016 Mammogram    Irregular vascular mass in the right breast at 10 o'clock, 10 cm the nipple, measuring 2.9 x 2.5 x 2.6 cm. In the right axilla there is 3 discrete enlarged abnormal appearing lymph nodes. Largest, which lies lateral to the palpable mass, measures 1.9 x 1.3 x 1.9 cm.      02/28/2016 Procedure    Right needle core biopsy in 10:00 position and right axillary biopsy      02/28/2016 Procedure    Postprocedure mammogram for clip placement. Ribbon shaped clip well-positioned within the biopsied mass in the right breast at the 10 o'clock axis. Wing shaped clip is well-positioned within the biopsied lymph node as seen on postprocedure ultrasound evaluation.  Final Assessment: Post Procedure Mammograms for Marker Placement      03/01/2016 Pathology Results    Invasive ductal carcinoma with extracellular mucin of right breast mass and ductal carcinoma of right axillary lymph node.  Grade 2.  ER 100%, PR 100%, Ki-67 12%, HER2 NEGATIVE.      03/12/2016 Imaging    CT CAP- Right breast cancer with associated right axillary metastatic lymph nodes. 2. No evidence of distant metastatic disease. 3. Hepatic steatosis.      03/26/2016 Breast MRI    2.7 cm spiculated mass in the upper-outer quadrant of right breast corresponding with the invasive ductal carcinoma. Masses in the right axilla consistent with axillary adenopathy.      HISTORY OF PRESENT ILLNESS:   Ashley Doyle is a 70 y.o. female with a medical  history significant for tobacco abuse, hyperlipidemia, major depressive disorder, scistica, dysthymic disorder, esstenial HTN,  who is here for a follow-up invasive ductal carcinoma of right breast ER/PR +, HER2 NEGATIVE, with right axillary lymph node positive for disease.  She did not see Dr. Donne Hazel, because she thought she needed to do an MRI first. She cancelled the appointment.  Patient says she does not know she can do radiation, because her arthritis is so debilitating that she cannot walk for long periods. Therefore, she does not know if she can make her appointments. She continues to think of reasons why she cannot do therapy.   Her other concern is having a stroke because her blood pressure rises whenever she is in pain. She is afraid that she will have pain during chemotherapy or radiation. Currently she denies any problems. Appetite is unchanged.  She presents to review recent imaging and labs and for ongoing discussion of treatment.    Review of Systems  Constitutional: Negative.  Negative for chills, fever and weight loss.  HENT: Positive for hearing loss.   Eyes: Negative.  Negative for blurred vision and double vision.  Respiratory: Negative.  Negative for cough.   Cardiovascular: Negative.  Negative for chest pain.  Gastrointestinal: Negative.  Negative for constipation, diarrhea, nausea and vomiting.  Genitourinary: Negative.   Musculoskeletal: Negative.   Skin: Negative.   Neurological: Negative.   Endo/Heme/Allergies: Negative.   Psychiatric/Behavioral: Negative.   [place]  point review of systems was performed and is negative except as detailed under history of present illness and above   PAST MEDICAL HISTORY:   Past Medical History:  Diagnosis Date  . Breast cancer ([place])   . COPD (chronic obstructive pulmonary disease) ([place])   . Hypertension   . Invasive ductal carcinoma of breast, female, right ([place]) 03/07/2016  . Stroke [facility])     ALLERGIES: No Known  Allergies    MEDICATIONS: I have reviewed the patient's current medications.    Current Outpatient Prescriptions on File Prior to Visit  Medication Sig Dispense Refill  . citalopram (CELEXA) 40 MG tablet Take 40 mg by mouth daily.  4  . GABAPENTIN PO Take by mouth daily.    . traMADol (ULTRAM) 50 MG tablet Take 50 mg by mouth every 6 (six) hours.  0  . triamterene-hydrochlorothiazide (MAXZIDE-25) 37.5-25 MG tablet Take 1 tablet by mouth daily.  1   No current facility-administered medications on file prior to visit.      PAST SURGICAL HISTORY Past Surgical History:  Procedure Laterality Date  . ABDOMINAL HYSTERECTOMY    . APPENDECTOMY    . INNER EAR SURGERY Right     FAMILY HISTORY: She reports a family history of cancer:  Bother deceased at the age of 47 secondary to colon cancer  Brother is survivor of bladder cancer  Sister is survivor with history of colon cancer requiring colectomy.  1 son is deceased at the age of 27 secondary to complications with cerebral palsy 1 son alive, living in [place].  He is a Curator. 1 Grandson 1 Great-granddaughter.  SOCIAL HISTORY:  reports that she has been smoking Cigarettes.  She has a 108.00 pack-year smoking history. She has never used smokeless tobacco. She reports that she does not drink alcohol or use drugs.   She is Psychologist, forensic in religion.  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  She is married x 24 years to current husband who is a cancer survivor himself of head and neck cancer treated by Dr. Isidore Moos (XRT) and Dr. Alvy Bimler (Medical Oncology); currently 1.5 year survivor.  Social History   Social History  . Marital status: Married    Spouse name: N/A  . Number of children: N/A  . Years of education: N/A   Social History Main Topics  . Smoking status: Current Every Day Smoker    Packs/day: 2.00    Years: 54.00    Types: Cigarettes  . Smokeless tobacco: Never Used  . Alcohol use No  . Drug use: No  .  Sexual activity: Not Asked     Comment: hysterectomy   Other Topics Concern  . None   Social History Narrative  . None    PERFORMANCE STATUS: The patient's performance status is 1 - Symptomatic but completely ambulatory  PHYSICAL EXAM: Most Recent Vital Signs: Blood pressure (!) 150/73, pulse 85, temperature 98 F (36.7 C), temperature source Oral, resp. rate 18, weight 185 lb 9.6 oz (84.2 kg), SpO2 95 %. General appearance: alert, cooperative, appears older than stated age, no distress, moderately obese and accompanied by her husband Head: Normocephalic, without obvious abnormality, atraumatic Eyes: negative findings: lids and lashes normal, conjunctivae and sclerae normal and corneas clear Throat: abnormal findings: dentition: upper dentures and edentulous Neck: no adenopathy, supple, symmetrical, trachea midline and thyroid not enlarged, symmetric, no tenderness/mass/nodules Lungs: clear to auscultation bilaterally and normal percussion bilaterally Breasts: negative findings: left breast is negative for any masses/lesions  or skin changes., positive findings: right breast with palpable lesion in 10:00 position and axillary lymph node (difficult to palpate due to post-biopsy tenderness) large ecchymosis Heart: regular rate and rhythm, S1, S2 normal, no murmur, click, rub or gallop Abdomen: soft, non-tender; bowel sounds normal; no masses,  no organomegaly Extremities: extremities normal, atraumatic, no cyanosis or edema Skin: Skin color, texture, turgor normal. No rashes or lesions Lymph nodes: Axillary adenopathy: as noted above on right Neurologic: Grossly normal  LABORATORY DATA:  No results found for this or any previous visit (from the past 48 hour(s)).   Results for Ashley, Doyle (MRN [id number]) as of 03/28/2016 18:37  Ref. Range 03/07/2016 13:16  Sodium Latest Ref Range: 135 - 145 mmol/L 134 (L)  Potassium Latest Ref Range: 3.5 - 5.1 mmol/L 3.7  Chloride Latest Ref  Range: 101 - 111 mmol/L 98 (L)  CO2 Latest Ref Range: 22 - 32 mmol/L 28  BUN Latest Ref Range: 6 - 20 mg/dL 11  Creatinine Latest Ref Range: 0.44 - 1.00 mg/dL 0.94  Calcium Latest Ref Range: 8.9 - 10.3 mg/dL 9.3  EGFR (Non-African Amer.) Latest Ref Range: >60 mL/min >60  EGFR (African American) Latest Ref Range: >60 mL/min >60  Glucose Latest Ref Range: 65 - 99 mg/dL 138 (H)  Anion gap Latest Ref Range: 5 - 15  8  Alkaline Phosphatase Latest Ref Range: 38 - 126 U/L 87  Albumin Latest Ref Range: 3.5 - 5.0 g/dL 4.3  AST Latest Ref Range: 15 - 41 U/L 31  ALT Latest Ref Range: 14 - 54 U/L 35  Total Protein Latest Ref Range: 6.5 - 8.1 g/dL 7.3  Total Bilirubin Latest Ref Range: 0.3 - 1.2 mg/dL 0.4  WBC Latest Ref Range: 4.0 - 10.5 K/uL 8.8  RBC Latest Ref Range: 3.87 - 5.11 MIL/uL 4.78  Hemoglobin Latest Ref Range: 12.0 - 15.0 g/dL 15.3 (H)  HCT Latest Ref Range: 36.0 - 46.0 % 42.8  MCV Latest Ref Range: 78.0 - 100.0 fL 89.5  MCH Latest Ref Range: 26.0 - 34.0 pg 32.0  MCHC Latest Ref Range: 30.0 - 36.0 g/dL 35.7  RDW Latest Ref Range: 11.5 - 15.5 % 13.4  Platelets Latest Ref Range: 150 - 400 K/uL 220  Neutrophils Latest Units: % 60  Lymphocytes Latest Units: % 30  Monocytes Relative Latest Units: % 8  Eosinophil Latest Units: % 3  Basophil Latest Units: % 1  NEUT# Latest Ref Range: 1.7 - 7.7 K/uL 5.3  Lymphocyte # Latest Ref Range: 0.7 - 4.0 K/uL 2.6  Monocyte # Latest Ref Range: 0.1 - 1.0 K/uL 0.7  Eosinophils Absolute Latest Ref Range: 0.0 - 0.7 K/uL 0.2  Basophils Absolute Latest Ref Range: 0.0 - 0.1 K/uL 0.0   RADIOGRAPHY: I have reviewed the data as listed below and agree with the findings.  Study Result   CLINICAL DATA:  Biopsy proven invasive ductal carcinoma with extracellular mucin in the 10 o'clock region of the right breast. Pathology of a right axillary mass revealed ductal carcinoma. No lymphoid tissue was seen in this mass and it is thought to be a lymph node  replaced by tumor.  LABS:  None obtained at the time of imaging.  EXAM: BILATERAL BREAST MRI WITH AND WITHOUT CONTRAST  TECHNIQUE: Multiplanar, multisequence MR images of both breasts were obtained prior to and following the intravenous administration of 17 ml of MultiHance.  THREE-DIMENSIONAL MR IMAGE RENDERING ON INDEPENDENT WORKSTATION:  Three-dimensional MR images were rendered by post-processing  of the original MR data on an independent workstation. The three-dimensional MR images were interpreted, and findings are reported in the following complete MRI report for this study. Three dimensional images were evaluated at the independent DynaCad workstation  COMPARISON:  Previous exam(s).  FINDINGS: Breast composition: b. Scattered fibroglandular tissue.  Background parenchymal enhancement: Moderate enhancing fibronodular pattern, bilaterally.  Right breast: In the posterior third of the upper-outer quadrant of the right breast is an enhancing irregular mass measuring 2.9 x 3.1 x 3.1 cm. It is associated with a signal void artifact from the biopsy clip.  Left breast: No mass or abnormal enhancement.  Lymph nodes: Directly posterior to the spiculated mass in the right breast is a 2.7 x 1.3 x 1.7 cm mass associated with a signal void artifact. This corresponds with the second mass biopsied. No lymphoid tissue was seen in this mass pathologically. It is thought to be a lymph node completely replaced by tumor. Adjacent to this are 2 smaller masses measure 1.7 and 1.0 cm consistent with metastatic lymph nodes. No abnormal lymph nodes are seen in the left axilla.  Ancillary findings:  None.  IMPRESSION: 2.7 cm spiculated mass in the upper-outer quadrant of right breast corresponding with the invasive ductal carcinoma. Masses in the right axilla consistent with axillary adenopathy.  RECOMMENDATION: Treatment planning of the biopsy proven right breast  cancer and axillary metastasis is recommended.  BI-RADS CATEGORY  6: Known biopsy-proven malignancy.   Electronically Signed   By: Lillia Mountain M.D.   On: 03/26/2016 10:01     Study Result   CLINICAL DATA:  MRI clearance.  History of bilateral appear surgery.  EXAM: CT HEAD WITHOUT CONTRAST  TECHNIQUE: Contiguous axial images were obtained from the base of the skull through the vertex without intravenous contrast.  COMPARISON:  None.  FINDINGS: Brain: No acute intracranial findings. No mass lesions. The ventricles are in the midline without mass effect or shift. No extra-axial fluid collections. The gray-white differentiation is normal. The brainstem and cerebellum are grossly normal.  Vascular: Moderate vascular calcifications but no definite aneurysm or hyperdense vessels.  Skull: No skull lesions or fracture. Mild hyperostosis frontalis interna.  Sinuses/Orbits: The paranasal sinuses and mastoid air cells are clear. No metallic foreign bodies are identified in the middle or inner ear cavities. The globes are intact. No radiopaque foreign body.  Other: No scalp lesions or radiopaque foreign body.  IMPRESSION: Unremarkable head CT. No findings to preclude the patient from an MRI scan.   Electronically Signed   By: Marijo Sanes M.D.   On: 03/12/2016 14:59    Study Result   CLINICAL DATA:  Right breast cancer diagnosed 2 weeks ago. History of diabetes, hypertension, hysterectomy and appendectomy.  EXAM: CT CHEST, ABDOMEN, AND PELVIS WITH CONTRAST  TECHNIQUE: Multidetector CT imaging of the chest, abdomen and pelvis was performed following the standard protocol during bolus administration of intravenous contrast.  CONTRAST:  126m ISOVUE-300 IOPAMIDOL (ISOVUE-300) INJECTION 61%  COMPARISON:  Abdominal CT 05/28/2003  FINDINGS: CT CHEST FINDINGS  Cardiovascular: Mild atherosclerosis of the aorta, great vessels and coronary  arteries. No acute vascular findings are demonstrated. The heart size is normal. There is no pericardial effusion.  Mediastinum/Nodes: There are no enlarged mediastinal, hilar or internal mammary lymph nodes. There is a 1.6 x 1.2 cm right axillary node on image 19. There are adjacent right breast masses, further described below. The thyroid gland, trachea and esophagus demonstrate no significant findings.  Lungs/Pleura: There is no pleural  effusion. Mild emphysema. There is a subpleural nodule in the right lower lobe measuring 4 x 6 mm on image 120. There is a 3 mm lingular nodule on image 89. Both of these are unchanged from the previous study, consistent with benign findings. No suspicious pulmonary nodules.  Musculoskeletal/Chest wall: Heterogeneous right breast mass containing a biopsy clip is noted, measuring up to 2.9 cm on coronal image 69 and 2.7 x 2.7 cm on axial image 29. There is an adjacent axillary breast mass or lymph node with a biopsy clip, measuring 2.1 x 1.3 cm. Epidermal inclusion (sebaceous) cyst in the upper back measures 2.7 cm on image 8.  CT ABDOMEN AND PELVIS FINDINGS  Hepatobiliary: The hepatic density is diffusely decreased, consistent with steatosis. No focal lesions are identified. No evidence of gallstones, gallbladder wall thickening or biliary dilatation.  Pancreas: Unremarkable. No pancreatic ductal dilatation or surrounding inflammatory changes.  Spleen: Normal in size without focal abnormality.  Adrenals/Urinary Tract: Both adrenal glands appear normal. The kidneys appear normal without evidence of urinary tract calculus, suspicious lesion or hydronephrosis. No bladder abnormalities are seen.  Stomach/Bowel: No evidence of bowel wall thickening, distention or surrounding inflammatory change.  Vascular/Lymphatic: There are no enlarged abdominal or pelvic lymph nodes. Diffuse aortic and branch vessel  atherosclerosis.  Reproductive: Hysterectomy.  No evidence of adnexal mass.  Other: No evidence of abdominal wall mass or hernia. No ascites.  Musculoskeletal: No acute or significant osseous findings. No evidence of osseous metastatic disease. There is facet disease in the lower lumbar spine.  IMPRESSION: 1. Right breast cancer with associated right axillary metastatic lymph nodes. 2. No evidence of distant metastatic disease. 3. Hepatic steatosis. 4. Atherosclerosis, greatest within the abdominal aorta   Electronically Signed   By: Richardean Sale M.D.   On: 03/09/2016 17:04     PATHOLOGY:   Diagnosis 1. Breast, right, needle core biopsy, 10:00 - INVASIVE DUCTAL CARCINOMA WITH EXTRACELLULAR MUCIN. Estrogen Receptor: 100%, POSITIVE, STRONG STAINING INTENSITY Progesterone Receptor: 100%, POSITIVE, STRONG STAINING INTENSITY Proliferation Marker Ki67: 12% HER2 - NEGATIVE  2. Lymph node, needle/core biopsy, right axilla - DUCTAL CARCINOMA. - SEE MICROSCOPIC DESCRIPTION. Estrogen Receptor: 100%, POSITIVE, STRONG STAINING INTENSITY Progesterone Receptor: 100%, POSITIVE, STRONG STAINING INTENSITY Proliferation Marker Ki67: 15% HER2 - NEGATIVE  ASSESSMENT/PLAN:  Invasive ductal carcinoma of breast, female, right upper outer quadrant ER/PR +, HER2 NEGATIVE  Right axillary lymph node positive for disease (R axillary mets) Tobacco Abuse Hepatic Steatosis  I spent a lot of time today discussing her fears. We discussed her staging in detail and I again advised her that she is a candidate for neoadjuvant chemotherapy, and that I suspect Dr. Donne Hazel can offer her breast conservation however she will need to meet with him formally.  I personally reviewed and went over radiographic studies with the patient.  The results are noted within this dictation.    I addressed the importance of smoking cessation with the patient in detail.  We discussed the health benefits of  cessation.  We discussed the health detriments of ongoing tobacco use including but not limited to COPD, heart disease and malignancy. We reviewed the multiple options for cessation and I offered to refer her to smoking cessation classes. We discussed other alternatives to quit such as chantix, wellbutrin. We will continue to address this moving forward.t.  I have ordered an echocardiogram.   I will refer Everlene Farrier to Dr. Donne Hazel for port placement. As soon as we get a port in, we will have  her come in for teaching.   ORDERS PLACED FOR THIS ENCOUNTER: Orders Placed This Encounter  Procedures  . ECHOCARDIOGRAM COMPLETE   All questions were answered. The patient knows to call the clinic with any problems, questions or concerns. We can certainly see the patient much sooner if necessary.  This document serves as a record of services personally performed by Ancil Linsey, MD. It was created on her behalf by Elmyra Ricks, a trained medical scribe. The creation of this record is based on the scribe's personal observations and the provider's statements to them. This document has been checked and approved by the attending provider.  I have reviewed the above documentation for accuracy and completeness and I agree with the above.  This note is electronically signed by: Molli Hazard   03/27/2016 3:30 PM

## 2016-03-27 NOTE — Patient Instructions (Addendum)
[place] at [facility] Discharge Instructions  RECOMMENDATIONS MADE BY THE CONSULTANT AND ANY TEST RESULTS WILL BE SENT TO YOUR REFERRING PHYSICIAN.  You saw Dr.Penland today. Echo will be scheduled. Appt. With Dr. Donne Hazel will be scheduled. Anderson Malta will do chemo teaching.  Journey room appt. Follow up after 1st cycle. See Amy at checkout for appointments.  Thank you for choosing [place] at [facility] to provide your oncology and hematology care.  To afford each patient quality time with our provider, please arrive at least 15 minutes before your scheduled appointment time.   Beginning January 23rd 2017 lab work for the [facility] will be done in the  Main lab at [facility] on 1st floor. If you have a lab appointment with the [facility] please come in thru the  Main Entrance and check in at the main information desk  You need to re-schedule your appointment should you arrive 10 or more minutes late.  We strive to give you quality time with our providers, and arriving late affects you and other patients whose appointments are after yours.  Also, if you no show three or more times for appointments you may be dismissed from the clinic at the providers discretion.     Again, thank you for choosing [facility].  Our hope is that these requests will decrease the amount of time that you wait before being seen by our physicians.       _____________________________________________________________  Should you have questions after your visit to [facility], please contact our office at [phone] between the hours of 8:30 a.m. and 4:30 p.m.  Voicemails left after 4:30 p.m. will not be returned until the following business day.  For prescription refill requests, have your pharmacy contact our office.         Resources For Cancer Patients and their Caregivers ? American Cancer Society: Can assist with  transportation, wigs, general needs, runs Look Good Feel Better.        ([phone] 666-1150 ? Cancer Care: Provides financial assistance, online support groups, medication/co-pay assistance.  1-800-813-HOPE (636)051-6021) ? [place] Assists [place] cancer patients and their families through emotional , educational and financial support.  870-626-3070 ? [place] DSS Where to apply for food stamps, Medicaid and utility assistance. 618-264-6127 ? [facility]: Transportation to medical appointments. 207-529-4093 ? Social Security Administration: May apply for disability if have a Stage IV cancer. 458-795-6731 (469)082-6378 ? [employer], Disability and Transit Services: Assists with nutrition, care and transit needs. Center Support Programs: @10RELATIVEDAYS @ > Cancer Support Group  2nd Tuesday of the month 1pm-2pm, Journey Room  > Creative Journey  3rd Tuesday of the month 1130am-1pm, Journey Room  > Look Good Feel Better  1st Wednesday of the month 10am-12 noon, Journey Room (Call [place] to register [phone])

## 2016-03-28 ENCOUNTER — Telehealth (HOSPITAL_COMMUNITY): Payer: Self-pay | Admitting: Emergency Medicine

## 2016-03-28 NOTE — Telephone Encounter (Signed)
Ashley Doyle to let him know that we made an appt with [facility] on Tuesday 04/03/2016 at 10:00 am.  This is with Dr Donne Hazel.  Address given [address].  This is for port and consultation.  Verbalized understanding.

## 2016-03-29 DIAGNOSIS — D509 Iron deficiency anemia, unspecified: Secondary | ICD-10-CM | POA: Diagnosis not present

## 2016-03-29 DIAGNOSIS — E785 Hyperlipidemia, unspecified: Secondary | ICD-10-CM | POA: Diagnosis not present

## 2016-03-29 DIAGNOSIS — E039 Hypothyroidism, unspecified: Secondary | ICD-10-CM | POA: Diagnosis not present

## 2016-03-29 DIAGNOSIS — I482 Chronic atrial fibrillation: Secondary | ICD-10-CM | POA: Diagnosis not present

## 2016-03-29 DIAGNOSIS — I10 Essential (primary) hypertension: Secondary | ICD-10-CM | POA: Diagnosis not present

## 2016-03-29 DIAGNOSIS — R7301 Impaired fasting glucose: Secondary | ICD-10-CM | POA: Diagnosis not present

## 2016-03-29 DIAGNOSIS — E782 Mixed hyperlipidemia: Secondary | ICD-10-CM | POA: Diagnosis not present

## 2016-03-29 DIAGNOSIS — E119 Type 2 diabetes mellitus without complications: Secondary | ICD-10-CM | POA: Diagnosis not present

## 2016-03-30 ENCOUNTER — Other Ambulatory Visit (HOSPITAL_COMMUNITY): Payer: Self-pay | Admitting: Hematology & Oncology

## 2016-03-30 MED ORDER — DEXAMETHASONE 4 MG PO TABS: ORAL_TABLET | ORAL | 1 refills | Status: DC

## 2016-03-30 MED ORDER — LIDOCAINE-PRILOCAINE 2.5-2.5 % EX CREA: TOPICAL_CREAM | CUTANEOUS | 3 refills | Status: DC

## 2016-03-30 MED ORDER — PROCHLORPERAZINE MALEATE 10 MG PO TABS: 10.0000 mg | ORAL_TABLET | Freq: Four times a day (QID) | ORAL | 1 refills | Status: DC | PRN

## 2016-03-30 MED ORDER — ONDANSETRON HCL 8 MG PO TABS: 8.0000 mg | ORAL_TABLET | Freq: Two times a day (BID) | ORAL | 1 refills | Status: DC | PRN

## 2016-03-30 NOTE — Progress Notes (Signed)
START ON PATHWAY REGIMEN - Breast  [id number]: AC-T - [Doxorubicin + Cyclophosphamide q21 Days x 4 Cycles, Followed by Paclitaxel Weekly x 12 Weeks]  Doxorubicin + Cyclophosphamide (AC):   A cycle is every 21 days:     Doxorubicin (Adriamycin(R)) 60 mg/m2 IV Push followed by Dose Mod: None     Cyclophosphamide (Cytoxan(R)) 600 mg/m2 in 250 mL NS IV over 30 minutes Dose Mod: None  **Always confirm dose/schedule in your pharmacy ordering system**    Paclitaxel 80 mg/m2 Weekly:   Administer weekly:     Paclitaxel (Taxol(R)) 80 mg/m2 in 250 mL NS IV over 1 hour Dose Mod: None  **Always confirm dose/schedule in your pharmacy ordering system**    Patient Characteristics: Neoadjuvant Chemotherapy, HER2/neu Negative/Unknown/Equivocal AJCC Stage Grouping: IIB Current Disease Status: No Distant Mets or Local Recurrence AJCC M Stage: 0 ER Status: Positive (+) AJCC N Stage: 1 AJCC T Stage: 2 HER2/neu: Negative (-) PR Status: Negative (-)  Intent of Therapy: Curative Intent, Discussed with Patient

## 2016-03-30 NOTE — Patient Instructions (Addendum)
[place]   CHEMOTHERAPY INSTRUCTIONS  You have been diagnosed with Stage 2 invasive ductal carcinoma of the right breast.  You were ER/PR positive, HER2 negative, with postivie lymph nodes in the right axilla.  The chemotherapy we are using to treat you is 4 cycles of adriamycin and cytoxan followed by 12 weekly cycles of Taxol.   1 cycle of AC is every 3 weeks.  This treatment is with curative intent.   You will see the doctor regularly throughout treatment.  We monitor your lab work prior to every treatment.  The doctor monitors your response to treatment by the way you are feeling, your blood work, and scans periodically.  You will receive premedications prior to each chemotherapy.  These include:   Premeds: Aloxi - high powered nausea/vomiting prevention medication used for chemotherapy patients. Emend - high powered nausea/vomiting prevention medication used for chemotherapy patients. Dexamethasone - steroid - given to reduce the risk of you having an allergic type reaction to the chemotherapy. Dex can cause you to feel energized, nervous/anxious/jittery, make you have trouble sleeping, and/or make you feel hot/flushed in the face/neck and/or look pink/red in the face/neck. These side effects will pass as the Dex wears off. (takes 20 minutes to infuse)  You will also receive neulasta on pro after each chemotherapy prior to leaving the clinic.   Neulasta - this medication is not chemo but being given because you have had chemo. It is usually given 24-27 hours after the completion of chemotherapy. This medication works by boosting your bone marrow's supply of white blood cells. White blood cells are what protect our bodies against infection. The medication is given in the form of a subcutaneous injection. It is given in the fatty tissue of your abdomen. It is a short needle. The major side effect of this medication is bone or muscle pain. The drug of choice to relieve or lessen the  pain is Aleve or Ibuprofen. If a physician has ever told you not to take Aleve or Ibuprofen - then don't take it. You should then take Tylenol/acetaminophen. Take either medication as the bottle directs you to.  The level of pain you experience as a result of this injection can range from none, to mild or moderate, or severe. Please let us know if you develop moderate or severe bone pain.   You can take Claritin 10 mg over the counter for a few days after receiving neulasta to help with the bone aches and pains.    **DO NOT expose the Neulasta On-body injector to diagnostic imaging (CT scans, MRI, Ultrasound, X-ray), radiation treatment, or oxygen rich environments, such as hyperbaric chambers. **    POTENTIAL SIDE EFFECTS OF TREATMENT:  Cyclophosphamide (Generic Name) Other Names: Cytoxan, Neosar  About This Drug Cyclophosphamide is a drug used to treat cancer. It is given in the vein (IV) or by mouth.  Possible Side Effects (More Common) . Nausea and throwing up (vomiting). These symptoms may happen within a few hours after your treatment and may last up to 72 hours. Medicines are available to stop or lessen these side effects. . Bone marrow depression. This is a decrease in the number of white blood cells, red blood cells, and platelets. This may raise your risk of infection, make you tired and weak (fatigue), and raise your risk of bleeding. . Hair loss: You may notice hair getting thin. Some patients lose their hair. Hair loss is often complete scalp hair loss and can involve  loss of eyebrows, eyelashes, and pubic hair. You may notice this a few days or weeks after treatment has started. Most often hair loss is temporary; your hair should grow back when treatment is done. . Decreased appetite (decreased hunger) . Blurred vision . Soreness of the mouth and throat. You may have red areas, white patches, or sores that hurt. . Effects on the bladder. This drug may cause irritation and bleeding  in the bladder. You may have blood in your urine. To help stop this, you will get extra fluids to help you pass more urine. You may get a drug called mesna, which helps to decrease irritation and bleeding. You may also get a medicine to help you pass more urine. You may have a catheter (tube) placed in your bladder so that your bladder will be washed with this drug.  Possible Side Effects (Less Common) . Darkening of the skin or nails . Metallic taste in the mouth . Changes in lung tissue may happen with large amounts of this drug. These changes may not last forever, and your lung tissue may go back to normal. Sometimes these changes may not be seen for many years. You may get a cough or have trouble catching your breath.  Allergic Reactions Serious allergic reactions including anaphylaxis are rare. While you are getting this drug in your vein (IV), tell your nurse right away if you have any of these symptoms of an allergic reaction: . Trouble catching your breath . Feeling like your tongue or throat are swelling . Feeling your heart beat quickly or in a not normal way (palpitations) . Feeling dizzy or lightheaded . Flushing, itching, rash, and/or hives  Treating Side Effects . Drink 6-8 cups of fluids each day unless your doctor has told you to limit your fluid intake due to some other health problem. A cup is 8 ounces of fluid. If you throw up or have loose bowel movements you should drink more fluids so that you do not become dehydrated (lack water in the body due to losing too much fluid). . Ask your doctor or nurse about medicine that is available to help stop or lessen nausea or throwing up. . Mouth care is very important. Your mouth care should consist of routine, gentle cleaning of your teeth or dentures and rinsing your mouth with a mixture of 1/2 teaspoon of salt in 8 ounces of water or  teaspoon of baking soda in 8 ounces of water. This should be done at least after each meal and at  bedtime. . If you have mouth sores, avoid mouthwash that has alcohol. Also avoid alcohol and smoking because they can bother your mouth and throat. . Talk with your nurse about getting a wig before you lose your hair. Also, call the [facility] at 800-ACS-2345 to find out information about the " Look Good.Marland KitchenMarland KitchenFeel Better" program close to where you live. It is a free program where women undergoing chemotherapy learn about wigs, turbans and scarves as well as makeup techniques and skin and nail care.  Important Information . Whenever you tell a doctor or nurse your health history, always tell them that you have received cyclophosphamide in the past. . If you take this drug by mouth swallow the medicine whole. Do not chew, break or crush it. . You can take the medicine with or without food. If you have nausea, take it with food. Do not take the pills at bedtime.  Food and Drug Interactions There are no  known interactions of cyclophosphamide with food. This drug may interact with other medicines. Tell your doctor and pharmacist about all the medicines and dietary supplements (vitamins, minerals, herbs and others) that you are taking at this time. The safety and use of dietary supplements and alternative diets are often not known. Using these might affect your cancer or interfere with your treatment. Until more is known, you should not use dietary supplements or alternative diets without your cancer doctor's help.  When to Call the Doctor Call your doctor or nurse right away if you have any of these symptoms: . Fever of 100.5 F (38 C) or higher . Chills . Bleeding or bruising that is not normal . Blurred vision or other changes in eyesight . Pain when passing urine; blood in urine . Pain in your lower back or side . Wheezing or trouble breathing . Swelling of legs, ankles, or feet . Feeling dizzy or lightheaded . Feeling confused or agitated . Signs of liver problems: dark urine, pale  bowel movements, bad stomach pain, feeling very tired and weak, unusual itching, or yellowing of the eyes or skin . Unusual thirst or passing urine often . Nausea that stops you from eating or drinking . Throwing up more than 3 times a day Call your doctor or nurse as soon as possible if any of these symptoms happen: . Pain in your mouth or throat that makes it hard to eat or drink . Nausea not relieved by prescribed medicines  Sexual Problems and Reproductive Concerns . Infertility warning: Sexual problems and reproduction concerns may happen. In both men and women, this drug may affect your ability to have children. This cannot be determined before your treatment. Talk with your doctor or nurse if you plan to have children. Ask for information on sperm or egg banking. . In men, this drug may interfere with your ability to make sperm, but it should not change your ability to have sexual relations. . In women, menstrual bleeding may become irregular or stop while you are getting this drug. Do not assume that you cannot become pregnant if you do not have a menstrual period. . Women may go through signs of menopause (change of life) like vaginal dryness or itching. Vaginal lubricants can be used to lessen vaginal dryness, itching, and pain during sexual relations. . Genetic counseling is available for you to talk about the effects of this drug therapy on future pregnancies. Also, a genetic counselor can look at the possible risk of problems in the unborn baby due to this medicine if an exposure happens during pregnancy. . Pregnancy warning: This drug may have harmful effects on the unborn child, so effective methods of birth control should be used during your cancer treatment. . Breast feeding warning: Women should not breast feed during treatment because this drug could enter the breast milk and badly harm a breast feeding baby   Doxorubicin (Generic Name) Other Names: Adriamycin, hydroxyl  daunorubicin  About This Drug Doxorubicin is a drug used to treat cancer. This drug is given in the vein (IV).  Possible Side Effects (More Common) . Bone marrow depression. This is a decrease in the number of white blood cells, red blood cells, and platelets. This may raise your risk of infection, make you tired and weak (fatigue), and raise your risk of bleeding. . Hair loss: Hair loss is often complete scalp hair loss and can involve loss of eyebrows, eyelashes, and pubic hair. You may notice this a few days  or weeks after treatment has started. Most often hair loss is temporary; your hair should grow back when treatment is done. . Nausea and throwing up (vomiting). These symptoms may happen within a few hours after your treatment and may last up to 24 hours. Medicines are available to stop or lessen these side effects. . Soreness of the mouth and throat. You may have red areas, white patches, or sores that hurt. . Change in the color of your urine to pink or red. This color change will go away in one to two days. . Effects on the heart: This drug can weaken the heart and lower heart function. Your heart function will be checked as needed. You may have trouble catching your breath, mainly during activities. You may also have trouble breathing while lying down, and have swelling in your ankles  . Sensitivity to light (photosensitivity). Photosensitivity means that you may become more sensitive to the effects of the sun, sun lamps, and tanning beds. Your eyes may water more, mostly in bright light. . Metallic taste in the mouth: This may change the taste of food and drinks . Decreased appetite (decreased hunger) . Darkening of the skin or nails . Weakness that interferes with your daily activities  Possible Side Effects (Less Common) . Skin and tissue irritation may involve redness, pain, warmth, or swelling at the IV site. This happens if the drug leaks out of the vein and into nearby  tissue. . Changes in your liver function. Your doctor will check your liver function as needed. . This drug may cause an increased risk of developing a second cancer  Allergic Reaction Serious allergic reactions, including anaphylaxis are rare. While you are getting this drug in your vein (IV), tell your nurse right away if you have any of these symptoms of an allergic reaction: . Trouble catching your breath . Feeling like your tongue or throat are swelling . Feeling your heart beat quickly or in a not normal way (palpitations) . Feeling dizzy or lightheaded . Flushing, itching, rash, and/or hives  Treating Side Effects . Drink 6-8 cups of fluids every day unless your doctor has told you to limit your fluid intake due to some other health problem. A cup is 8 ounces of fluid. If you vomit or have diarrhea, you should drink more fluids so that you do not become dehydrated (lack water in the body due to losing too much fluid). . Ask your doctor or nurse about medicine that is available to help stop or lessen nausea, throwing up, and/or loose bowel movements . Wear dark sunglasses and use sunscreen with SPF 30 or higher when you are outdoors even for a short time. Cover up when you are out in the sun. Wear wide-brimmed hats, long-sleeved shirts, and pants. Keep your neck, chest, and back covered. . Mouth care is very important. Your mouth care should consist of routine, gentle cleaning of your teeth or dentures and rinsing your mouth with a mixture of 1/2 teaspoon of salt in 8 ounces of water or  teaspoon of baking soda in 8 ounces of water. This should be done at least after each meal and at bedtime. . If you have mouth sores, avoid mouthwash that has alcohol. Avoid alcohol and smoking because they can bother your mouth and throat. . Talk with your nurse about getting a wig before you lose your hair. Also, call the [facility] at 800-ACS-2345 to find out information about the "Look  Good, Feel Better"  program close to where you live. It is a free program where women getting chemotherapy can learn about wigs, turbans and scarves as well as makeup techniques and skin and nail care. . While you are getting this drug, please tell your nurse right away if you have any pain, redness, or swelling at the site of the IV infusion.  Food and Drug Interactions There are no known interactions of doxorubicin with food. This drug may interact with other medicines. Tell your doctor and pharmacist about all the medicines and dietary supplements (vitamins, minerals, herbs and others) that you are taking at this time. The safety and use of dietary supplements and alternative diets are often not known. Using these might affect your cancer or interfere with your treatment. Until more is known, you should not use dietary supplements or alternative diets without your cancer doctor's help.  When to Call the Doctor Call your doctor or nurse right away if you have any of these symptoms: . Fever of 100.5 F (38 C) or above . Chills . Easy bruising or bleeding . Wheezing or trouble breathing . Rash or itching . Feeling dizzy or lightheaded . Feeling that your heart is beating in a fast or not normal way (palpitations) . Loose bowel movements (diarrhea) more than 4 times a day or diarrhea with weakness or feeling lightheaded . Nausea that stops you from eating or drinking . Throwing up more than 3 times a day . Signs of liver problems: dark urine, pale bowel movements, bad stomach pain, feeling very tired and weak, unusual itching, or yellowing of the eyes or skin, . During the IV infusion, if you have pain, redness, or swelling at the site of the IV infusion, please tell your nurse right away  Call your doctor or nurse as soon as possible if any of these symptoms happen: . Decreased urine . Pain in your mouth or throat that makes it hard to eat or drink . Nausea and throwing up that is not  relieved by prescribed medicines . Rash that is not relieved by prescribed medicines . Swelling of legs, ankles, or feet . Weight gain of 5 pounds in one week (fluid retention) . Lasting loss of appetite or rapid weight loss of five pounds in a week . Fatigue that interferes with your daily activities . Extreme weakness that interferes with normal activities  Sexual Problems and Reproduction Concerns . Infertility warning: Sexual problems and reproduction concerns may happen. In both men and women, this drug may affect your ability to have children. This cannot be determined before your treatment. Talk with your doctor or nurse if you plan to have children. Ask for information on sperm or egg banking. . In men, this drug may interfere with your ability to make sperm, but it should not change your ability to have sexual relations. . In women, menstrual bleeding may become irregular or stop while you are getting this drug. Do not assume that you cannot become pregnant if you do not have a menstrual period. . Women may go through signs of menopause (change of life) like vaginal dryness or itching. Vaginal lubricants can be used to lessen vaginal dryness, itching, and pain during sexual relations. . Genetic counseling is available for you to talk about the effects of this drug therapy on future pregnancies. Also, a genetic counselor can look at the possible risk of problems in the unborn baby due to this medicine if an exposure happens during pregnancy. . Pregnancy warning: This drug  may have harmful effects on the unborn baby, so effective methods of birth control should be used by you and your partner during your cancer treatment and for at least 6 months after treatment . Breast feeding warning: Women should not breast feed during treatment because this drug could enter the breast milk and badly harm a breast feeding baby.   Taxol - the first time you receive this drug we will titrate it very slowly  to ensure that you do not have or are not having an infusional reaction to the chemo. Side Effects: hair loss, lowers your white blood cells (fight infection), muscle aches, nausea/vomiting, irritation to the mouth (mouth sores, pain in your mouth) *neuropathy - numbness/tingling/burning in hands/fingers/feet/toes. We need to know as soon as this begins to happen so that we can monitor it and treat if necessary. The numbness generally begins in the fingertips of tips of toes and then begins to travel up the finger/toe/hand/foot. We never want you getting to where you can't pick up a pen, coin, zip a zipper, button a button, or have trouble walking. You must tell us immediately if you are experiencing peripheral neuropathy! (the first time you receive this, it will take approximately 3 hours, after that it will only take 1 hour)   SELF IMAGE NEEDS AND REFERRALS MADE: Information on look good, feel better given.   EDUCATIONAL MATERIALS GIVEN AND REVIEWED: Chemotherapy and you book given, nutrition book given, and information on adriamycin, cytoxan, and neulasta.    SELF CARE ACTIVITIES WHILE ON CHEMOTHERAPY:  Hydration Increase your fluid intake 48 hours prior to treatment and drink at least 8 to 12 cups (64 ounces) of water/decaff beverages per day after treatment. You can still have your cup of coffee or soda but these beverages do not count as part of your 8 to 12 cups that you need to drink daily. No alcohol intake.  Medications Continue taking your normal prescription medication as prescribed.  If you start any new herbal or new supplements please let us know first to make sure it is safe.  Mouth Care Have teeth cleaned professionally before starting treatment. Keep dentures and partial plates clean. Use soft toothbrush and do not use mouthwashes that contain alcohol. Biotene is a good mouthwash that is available at most pharmacies or may be ordered by calling [phone]. Use warm salt  water gargles (1 teaspoon salt per 1 quart warm water) before and after meals and at bedtime. Or you may rinse with 2 tablespoons of three-percent hydrogen peroxide mixed in eight ounces of water. If you are still having problems with your mouth or sores in your mouth please call the clinic. If you need dental work, please let Dr. Whitney Muse know before you go for your appointment so that we can coordinate the best possible time for you in regards to your chemo regimen. You need to also let your dentist know that you are actively taking chemo. We may need to do labs prior to your dental appointment.   Skin Care Always use sunscreen that has not expired and with SPF (Sun Protection Factor) of 50 or higher. Wear hats to protect your head from the sun. Remember to use sunscreen on your hands, ears, face, & feet.  Use good moisturizing lotions such as udder cream, eucerin, or even Vaseline. Some chemotherapies can cause dry skin, color changes in your skin and nails.    . Avoid long, hot showers or baths. . Use gentle, fragrance-free soaps and  laundry detergent. . Use moisturizers, preferably creams or ointments rather than lotions because the thicker consistency is better at preventing skin dehydration. Apply the cream or ointment within 15 minutes of showering. Reapply moisturizer at night, and moisturize your hands every time after you wash them.  Hair Loss (if your doctor says your hair will fall out)  . If your doctor says that your hair is likely to fall out, decide before you begin chemo whether you want to wear a wig. You may want to shop before treatment to match your hair color. . Hats, turbans, and scarves can also camouflage hair loss, although some people prefer to leave their heads uncovered. If you go bare-headed outdoors, be sure to use sunscreen on your scalp. . Cut your hair short. It eases the inconvenience of shedding lots of hair, but it also can reduce the emotional impact of watching  your hair fall out. . Don't perm or color your hair during chemotherapy. Those chemical treatments are already damaging to hair and can enhance hair loss. Once your chemo treatments are done and your hair has grown back, it's OK to resume dyeing or perming hair. With chemotherapy, hair loss is almost always temporary. But when it grows back, it may be a different color or texture. In older adults who still had hair color before chemotherapy, the new growth may be completely gray.  Often, new hair is very fine and soft.  Infection Prevention Please wash your hands for at least 30 seconds using warm soapy water. Handwashing is the #1 way to prevent the spread of germs. Stay away from sick people or people who are getting over a cold. If you develop respiratory systems such as green/yellow mucus production or productive cough or persistent cough let us know and we will see if you need an antibiotic. It is a good idea to keep a pair of gloves on when going into grocery stores/[employer] to decrease your risk of coming into contact with germs on the carts, etc. Carry alcohol hand gel with you at all times and use it frequently if out in public. If your temperature reaches 100.5 or higher please call the clinic and let us know.  If it is after hours or on the weekend please go to the ER if your temperature is over 100.5.  Please have your own personal thermometer at home to use.    Sex and bodily fluids If you are going to have sex, a condom must be used to protect the person that isn't taking chemotherapy. Chemo can decrease your libido (sex drive). For a few days after chemotherapy, chemotherapy can be excreted through your bodily fluids.  When using the toilet please close the lid and flush the toilet twice.  Do this for a few day after you have had chemotherapy.     Effects of chemotherapy on your sex life Some changes are simple and won't last long. They won't affect your sex life permanently. Sometimes  you may feel: . too tired . not strong enough to be very active . sick or sore  . not in the mood . anxious or low Your anxiety might not seem related to sex. For example, you may be worried about the cancer and how your treatment is going. Or you may be worried about money, or about how you family are coping with your illness. These things can cause stress, which can affect your interest in sex. It's important to talk to your partner about how  you feel. Remember - the changes to your sex life don't usually last long. There's usually no medical reason to stop having sex during chemo. The drugs won't have any long term physical effects on your performance or enjoyment of sex. Cancer can't be passed on to your partner during sex  Contraception It's important to use reliable contraception during treatment. Avoid getting pregnant while you or your partner are having chemotherapy. This is because the drugs may harm the baby. Sometimes chemotherapy drugs can leave a man or woman infertile.  This means you would not be able to have children in the future. You might want to talk to someone about permanent infertility. It can be very difficult to learn that you may no longer be able to have children. Some people find counselling helpful. There might be ways to preserve your fertility, although this is easier for men than for women. You may want to speak to a fertility expert. You can talk about sperm banking or harvesting your eggs. You can also ask about other fertility options, such as donor eggs. If you have or have had breast cancer, your doctor might advise you not to take the contraceptive pill. This is because the hormones in it might affect the cancer.  It is not known for sure whether or not chemotherapy drugs can be passed on through semen or secretions from the vagina. Because of this some doctors advise people to use a barrier method if you have sex during treatment. This applies to vaginal, anal or  oral sex. Generally, doctors advise a barrier method only for the time you are actually having the treatment and for about a week after your treatment. Advice like this can be worrying, but this does not mean that you have to avoid being intimate with your partner. You can still have close contact with your partner and continue to enjoy sex.  Animals If you have cats or birds we just ask that you not change the litter or change the cage.  Please have someone else do this for you while you are on chemotherapy.   Food Safety During and After Cancer Treatment Food safety is important for people both during and after cancer treatment. Cancer and cancer treatments, such as chemotherapy, radiation therapy, and stem cell/bone marrow transplantation, often weaken the immune system. This makes it harder for your body to protect itself from foodborne illness, also called food poisoning. Foodborne illness is caused by eating food that contains harmful bacteria, parasites, or viruses.  Foods to avoid Some foods have a higher risk of becoming tainted with bacteria. These include: Marland Kitchen Unwashed fresh fruit and vegetables, especially leafy vegetables that can hide dirt and other contaminants . Raw sprouts, such as alfalfa sprouts . Raw or undercooked beef, especially ground beef, or other raw or undercooked meat and poultry . Fatty, fried, or spicy foods immediately before or after treatment.  These can sit heavy on your stomach and make you feel nauseous. . Raw or undercooked shellfish, such as oysters. . Sushi and sashimi, which often contain raw fish.  . Unpasteurized beverages, such as unpasteurized fruit juices, raw milk, raw yogurt, or cider . Undercooked eggs, such as soft boiled, over easy, and poached; raw, unpasteurized eggs; or foods made with raw egg, such as homemade raw cookie dough and homemade mayonnaise Simple steps for food safety Shop smart. . Do not buy food stored or displayed in an unclean  area. . Do not buy bruised or damaged fruits or  vegetables. . Do not buy cans that have cracks, dents, or bulges. . Pick up foods that can spoil at the end of your shopping trip and store them in a cooler on the way home. Prepare and clean up foods carefully. . Rinse all fresh fruits and vegetables under running water, and dry them with a clean towel or paper towel. . Clean the top of cans before opening them. . After preparing food, wash your hands for 20 seconds with hot water and soap. Pay special attention to areas between fingers and under nails. . Clean your utensils and dishes with hot water and soap. Marland Kitchen Disinfect your kitchen and cutting boards using 1 teaspoon of liquid, unscented bleach mixed into 1 quart of water.   Dispose of old food. . Eat canned and packaged food before its expiration date (the "use by" or "best before" date). . Consume refrigerated leftovers within 3 to 4 days. After that time, throw out the food. Even if the food does not smell or look spoiled, it still may be unsafe. Some bacteria, such as Listeria, can grow even on foods stored in the refrigerator if they are kept for too long. Take precautions when eating out. . At restaurants, avoid buffets and salad bars where food sits out for a long time and comes in contact with many people. Food can become contaminated when someone with a virus, often a norovirus, or another "bug" handles it. . Put any leftover food in a "to-go" container yourself, rather than having the server do it. And, refrigerate leftovers as soon as you get home. . Choose restaurants that are clean and that are willing to prepare your food as you order it cooked.    MEDICATIONS:  Dexamethasone 45m tablet. Take 2 tablets by mouth once a day on the day after chemotherapy and then take 2 tablets two times a day for 2 days. Take with food.                                                                                                                Zofran/Ondansetron 823mtablet. Take 1 tablet every 8 hours as needed for nausea/vomiting. (#1 nausea med to take, this can constipate)  Compazine/Prochlorperazine 1022mablet. Take 1 tablet every 6 hours as needed for nausea/vomiting. (#2 nausea med to take, this can make you sleepy)   EMLA cream. Apply a quarter size amount to port site 1 hour prior to chemo. Do not rub in. Cover with plastic wrap.   Over-the-Counter Meds:  Miralax 1 capful in 8 oz of fluid daily. May increase to two times a day if needed. This is a stool softener. If this doesn't work proceed you can add:  Senokot S-start with 1 tablet two times a day and increase to 4 tablets two times a day if needed. (total of 8 tablets in a 24 hour period). This is a stimulant laxative.   Call us [place] this does not help your bowels move.   Imodium 2mg56mpsule. Take 2 capsules  after the 1st loose stool and then 1 capsule every 2 hours until you go a total of 12 hours without having a loose stool. Call the [facility] if loose stools continue. If diarrhea occurs @ bedtime, take 2 capsules @ bedtime. Then take 2 capsules every 4 hours until morning. Call [facility].       Diarrhea Sheet  If you are having loose stools/diarrhea, please purchase Imodium and begin taking as outlined:  At the first sign of poorly formed or loose stools you should begin taking Imodium(loperamide) 2 mg capsules.  Take two caplets (449m) followed by one caplet (2449m every 2 hours until you have had no diarrhea for 12 hours.  During the night take two caplets (49m35mat bedtime and continue every 4 hours during the night until the morning.  Stop taking Imodium only after there is no sign of diarrhea for 12 hours.    Always call the [place] you are having loose stools/diarrhea that you can't get under control.  Loose stools/disrrhea leads to dehydration (loss of water) in your body.  We have other options of trying to get the loose stools/diarrhea to  stopped but you must let us [place]!      Constipation Sheet *Miralax in 8 oz of fluid daily.  May increase to two times a day if needed.  This is a stool softener.  If this not enough to keep your bowel regular:  You can add:  *Senokot S, start with one tablet twice a day and can increase to 4 tablets twice a day if needed.  This is a stimulant laxative.   Sometimes when you take pain medication you need BOTH a medicine to keep your stool soft and a medicine to help your bowel push it out!  Please call if the above does not work for you.   Do not go more than 2 days without a bowel movement.  It is very important that you do not become constipated.  It will make you feel sick to your stomach (nausea) and can cause abdominal pain and vomiting.       Nausea Sheet  Zofran/Ondansetron 8mg77mblet. Take 1 tablet every 8 hours as needed for nausea/vomiting. (#1 nausea med to take, this can constipate)  Compazine/Prochlorperazine 10mg43mlet. Take 1 tablet every 6 hours as needed for nausea/vomiting. (#2 nausea med to take, this can make you sleepy)  You can take these medications together or separately.  We would first like for you to try the Ondansetron by itself and then take the Prochloperizine if needed. But you are allowed to take both medications at the same time if your nausea is that severe.  If you are having persistent nausea (nausea that does not stop) please take these medications on a staggered schedule so that the nausea medication stays in your body.  Please call the [place] the amount of nausea that you are experiencing.  If you begin to vomit, you need to call the [place] it is the weekend and you have vomited more than one time and cant get it to stop-go to the Emergency Room.  Persistent nausea/vomiting can lead to dehydration (loss of fluid in your body) and will make you feel terrible.   Ice chips, sips of clear liquids, foods that are @  room temperature, crackers, and toast tend to be better tolerated.       SYMPTOMS TO REPORT AS SOON AS POSSIBLE  AFTER TREATMENT:  FEVER GREATER THAN 100.5 F  CHILLS WITH OR WITHOUT FEVER  NAUSEA AND VOMITING THAT IS NOT CONTROLLED WITH YOUR NAUSEA MEDICATION  UNUSUAL SHORTNESS OF BREATH  UNUSUAL BRUISING OR BLEEDING  TENDERNESS IN MOUTH AND THROAT WITH OR WITHOUT PRESENCE OF ULCERS  URINARY PROBLEMS  BOWEL PROBLEMS  UNUSUAL RASH    Wear comfortable clothing and clothing appropriate for easy access to any Portacath or PICC line. Let us know if there is anything that we can do to make your therapy better!      What to do if you need assistance after hours or on the weekends: CALL [phone].  HOLD on the line, do not hang up.  You will hear multiple messages but at the end you will be connected with a nurse triage line.  They will contact Dr Whitney Muse if necessary.  Most of the time they will be able to assist you.   Do not call the hospital operator.  Dr Whitney Muse will not answer phone calls received by them.       I have been informed and understand all of the instructions given to me and have received a copy. I have been instructed to call the clinic [phone]  or my family physician as soon as possible for continued medical care, if indicated. I do not have any more questions at this time but understand that I may call the [facility] at [phone] during office hours should I have questions or need assistance in obtaining follow-up care.

## 2016-04-02 ENCOUNTER — Encounter (HOSPITAL_COMMUNITY): Payer: Self-pay | Admitting: Emergency Medicine

## 2016-04-02 ENCOUNTER — Other Ambulatory Visit (HOSPITAL_COMMUNITY): Payer: Self-pay | Admitting: Oncology

## 2016-04-02 ENCOUNTER — Telehealth (HOSPITAL_COMMUNITY): Payer: Self-pay | Admitting: Emergency Medicine

## 2016-04-02 NOTE — Telephone Encounter (Signed)
Told pts husband that we had to change chemotherapy teaching to 10:30 on Wednesday 04/04/2016, he verbalized understanding.

## 2016-04-02 NOTE — Progress Notes (Signed)
Chemotherapy teaching pulled together.  Labs entered.  medications escribed.  Chemotherapy/doctors appts made.

## 2016-04-03 DIAGNOSIS — Z720 Tobacco use: Secondary | ICD-10-CM | POA: Diagnosis not present

## 2016-04-03 DIAGNOSIS — F331 Major depressive disorder, recurrent, moderate: Secondary | ICD-10-CM | POA: Diagnosis not present

## 2016-04-03 DIAGNOSIS — C50411 Malignant neoplasm of upper-outer quadrant of right female breast: Secondary | ICD-10-CM | POA: Diagnosis not present

## 2016-04-03 DIAGNOSIS — Z0000 Encounter for general adult medical examination without abnormal findings: Secondary | ICD-10-CM | POA: Diagnosis not present

## 2016-04-03 DIAGNOSIS — E1122 Type 2 diabetes mellitus with diabetic chronic kidney disease: Secondary | ICD-10-CM | POA: Diagnosis not present

## 2016-04-03 DIAGNOSIS — N182 Chronic kidney disease, stage 2 (mild): Secondary | ICD-10-CM | POA: Diagnosis not present

## 2016-04-03 DIAGNOSIS — I10 Essential (primary) hypertension: Secondary | ICD-10-CM | POA: Diagnosis not present

## 2016-04-03 DIAGNOSIS — G894 Chronic pain syndrome: Secondary | ICD-10-CM | POA: Diagnosis not present

## 2016-04-03 DIAGNOSIS — E782 Mixed hyperlipidemia: Secondary | ICD-10-CM | POA: Diagnosis not present

## 2016-04-03 DIAGNOSIS — H9190 Unspecified hearing loss, unspecified ear: Secondary | ICD-10-CM | POA: Diagnosis not present

## 2016-04-04 ENCOUNTER — Encounter (HOSPITAL_COMMUNITY): Payer: Medicare Other

## 2016-04-04 DIAGNOSIS — C50911 Malignant neoplasm of unspecified site of right female breast: Secondary | ICD-10-CM

## 2016-04-04 NOTE — Progress Notes (Signed)
Consent signed for doxorubicin and cyclophosmide  followed by taxol

## 2016-04-09 ENCOUNTER — Ambulatory Visit (HOSPITAL_COMMUNITY)
Admission: RE | Admit: 2016-04-09 | Discharge: 2016-04-09 | Disposition: A | Discharge status: Home / Self Care | Payer: Medicare Other | Source: Ambulatory Visit | Attending: Hematology & Oncology | Admitting: Hematology & Oncology

## 2016-04-09 DIAGNOSIS — C50911 Malignant neoplasm of unspecified site of right female breast: Secondary | ICD-10-CM

## 2016-04-09 DIAGNOSIS — I071 Rheumatic tricuspid insufficiency: Secondary | ICD-10-CM | POA: Insufficient documentation

## 2016-04-09 NOTE — Progress Notes (Signed)
*  PRELIMINARY RESULTS* Echocardiogram 2D Echocardiogram has been performed.  Ashley Doyle 04/09/2016, 10:11 AM

## 2016-04-10 ENCOUNTER — Other Ambulatory Visit (HOSPITAL_COMMUNITY): Payer: Self-pay | Admitting: Emergency Medicine

## 2016-04-10 ENCOUNTER — Encounter (HOSPITAL_COMMUNITY): Payer: Self-pay | Admitting: Emergency Medicine

## 2016-04-11 ENCOUNTER — Encounter (HOSPITAL_COMMUNITY): Payer: Self-pay

## 2016-04-11 ENCOUNTER — Encounter (HOSPITAL_COMMUNITY)
Admission: RE | Admit: 2016-04-11 | Discharge: 2016-04-11 | Disposition: A | Discharge status: Home / Self Care | Payer: Medicare Other | Source: Ambulatory Visit | Attending: General Surgery | Admitting: General Surgery

## 2016-04-11 DIAGNOSIS — F17210 Nicotine dependence, cigarettes, uncomplicated: Secondary | ICD-10-CM | POA: Diagnosis not present

## 2016-04-11 DIAGNOSIS — J449 Chronic obstructive pulmonary disease, unspecified: Secondary | ICD-10-CM | POA: Diagnosis not present

## 2016-04-11 DIAGNOSIS — I10 Essential (primary) hypertension: Secondary | ICD-10-CM | POA: Diagnosis not present

## 2016-04-11 DIAGNOSIS — Z8673 Personal history of transient ischemic attack (TIA), and cerebral infarction without residual deficits: Secondary | ICD-10-CM | POA: Diagnosis not present

## 2016-04-11 DIAGNOSIS — Z8611 Personal history of tuberculosis: Secondary | ICD-10-CM | POA: Diagnosis not present

## 2016-04-11 DIAGNOSIS — Z170 Estrogen receptor positive status [ER+]: Secondary | ICD-10-CM | POA: Diagnosis not present

## 2016-04-11 DIAGNOSIS — F329 Major depressive disorder, single episode, unspecified: Secondary | ICD-10-CM | POA: Diagnosis not present

## 2016-04-11 DIAGNOSIS — C50411 Malignant neoplasm of upper-outer quadrant of right female breast: Secondary | ICD-10-CM | POA: Diagnosis not present

## 2016-04-11 DIAGNOSIS — Z79899 Other long term (current) drug therapy: Secondary | ICD-10-CM | POA: Diagnosis not present

## 2016-04-11 HISTORY — DX: Presence of spectacles and contact lenses: Z97.3

## 2016-04-11 HISTORY — DX: Anxiety disorder, unspecified: F41.9

## 2016-04-11 HISTORY — DX: Unspecified hearing loss, unspecified ear: H91.90

## 2016-04-11 HISTORY — DX: Depression, unspecified: F32.A

## 2016-04-11 HISTORY — DX: Mononeuropathy, unspecified: G58.9

## 2016-04-11 HISTORY — DX: Gastro-esophageal reflux disease without esophagitis: K21.9

## 2016-04-11 HISTORY — DX: Unspecified osteoarthritis, unspecified site: M19.90

## 2016-04-11 HISTORY — DX: Unspecified asthma, uncomplicated: J45.909

## 2016-04-11 HISTORY — DX: Emphysema, unspecified: J43.9

## 2016-04-11 HISTORY — DX: Major depressive disorder, single episode, unspecified: F32.9

## 2016-04-11 HISTORY — DX: Presence of external hearing-aid: Z97.4

## 2016-04-11 HISTORY — DX: Respiratory tuberculosis unspecified: A15.9

## 2016-04-11 HISTORY — DX: Injury of unspecified nerves of neck, initial encounter: S14.9XXA

## 2016-04-11 HISTORY — DX: Family history of other specified conditions: Z84.89

## 2016-04-11 NOTE — Progress Notes (Signed)
Anesthesia Chart Review: Patient is a 70 year old female scheduled for insertion of Port-a-cath with U/S on 04/12/16 by Dr. Donne Hazel.  History includes right breast cancer (with + axillary node), smoking, CVA, HTN, COPD, hard of hearing (hearing aids), asthma, anxiety, depression, GERD, hysterectomy, appendectomy, history of "TB."   PCP is Dr. Wende Neighbors. HEM-ONC is Dr. Whitney Muse.  Meds include Celexa, gabapentin, Norco, tramadol, Maxzide 25. From what I can tell chemotherapy agents (odoxorubicin, cyclophosamide, Taxol), Decadron, and Neulasta will start once her port is placed.   BP (!) 153/71   Pulse 86   Resp 18   Ht 5\' 2"  (1.575 m)   Wt 181 lb 4.8 oz (82.2 kg)   SpO2 96%   BMI 33.16 kg/m   04/11/16 EKG: NSR, low voltage QRS, septal infarct (age undetermined). There are no comparison tracing in [facility] or [facility].  04/09/16 Echo: Study Conclusions - Left ventricle: The cavity size was normal. Wall thickness was   increased in a pattern of mild LVH. Systolic function was normal.   The estimated ejection fraction was in the range of 55% to 60%.   Wall motion was normal; there were no regional wall motion   abnormalities. Doppler parameters are consistent with abnormal   left ventricular relaxation (grade 1 diastolic dysfunction). - Left atrium: The atrium was at the upper limits of normal in   size. - Right atrium: Central venous pressure (est): 3 mm Hg. - Atrial septum: No defect or patent foramen ovale was identified. - Tricuspid valve: There was trivial regurgitation. - Pulmonary arteries: PA peak pressure: 34 mm Hg (S). - Pericardium, extracardiac: There was no pericardial effusion.  03/09/16 CT Chest/Abd/pelvis: IMPRESSION: 1. Right breast cancer with associated right axillary metastatic lymph nodes. 2. No evidence of distant metastatic disease. 3. Hepatic steatosis. 4. Atherosclerosis (mild atherosclerosis of the aorta, great vessels and coronary arteries), greatest within  the abdominal aorta.  She had labs drawn on 03/29/16 at [facility] that showed glucose 100, Cr 1.09, K 4.4, Na 135, AST 34, ALT 27, A1c 6.2, WBC 8.6, H/H 15.9/45.4.   If no acute changes then I anticipate that she can proceed as planned.  George Hugh [facility] Stay Center/Anesthesiology Phone [phone] 04/11/2016 12:46 PM

## 2016-04-11 NOTE — Progress Notes (Signed)
Pt denies SOB, chest pain, and being under the care of a cardiologist. Pt denies having a stress test and cardiac cath. Pt denies having an EKG and chest x ary within the last year. Pt chart forwarded top anesthesia for review; see note.

## 2016-04-11 NOTE — Pre-Procedure Instructions (Signed)
Ashley Doyle  04/11/2016      [facility] [place], [place] - [id number] [place] [address] Phone: [phone] Fax: [phone]  [id number] - [place], [place] [id number] [place] [address] Phone: [phone] Fax: [phone]    Your procedure is scheduled on Thursday, April 12, 2016  Report to [facility] Admitting at 5:30 A.M.  Call this number if you have problems the morning of surgery:  [phone]   Remember:  Do not eat food or drink liquids after midnight.  Take these medicines the morning of surgery with A SIP OF WATER: if needed: HYDROcodone for pain  Stop taking Aspirin, vitamins, fish oil and herbal medications. Do not take any NSAIDs ie: Ibuprofen, Advil, Naproxen, BC and Goody Powder or any medication containing Aspirin; stop now.  Do not wear jewelry, make-up or nail polish.  Do not wear lotions, powders, or perfumes, or deoderant.  Do not shave 48 hours prior to surgery.  Men may shave face and neck.  Do not bring valuables to the hospital.  [facility] is not responsible for any belongings or valuables.  Contacts, dentures or bridgework may not be worn into surgery.  Leave your suitcase in the car.  After surgery it may be brought to your room.  Patients discharged the day of surgery will not be allowed to drive home.   Special instructions:  [facility] - Preparing for Surgery  Before surgery, you can play an important role.  Because skin is not sterile, your skin needs to be as free of germs as possible.  You can reduce the number of germs on you skin by washing with CHG (chlorahexidine gluconate) soap before surgery.  CHG is an antiseptic cleaner which kills germs and bonds with the skin to continue killing germs even after washing.  Please DO NOT use if you have an allergy to CHG or antibacterial soaps.  If your skin becomes reddened/irritated  stop using the CHG and inform your nurse when you arrive at Short Stay.  Do not shave (including legs and underarms) for at least 48 hours prior to the first CHG shower.  You may shave your face.  Please follow these instructions carefully:   1.  Shower with CHG Soap the night before surgery and the morning of Surgery.  2.  If you choose to wash your hair, wash your hair first as usual with your normal shampoo.  3.  After you shampoo, rinse your hair and body thoroughly to remove the Shampoo.  4.  Use CHG as you would any other liquid soap.  You can apply chg directly  to the skin and wash gently with scrungie or a clean washcloth.  5.  Apply the CHG Soap to your body ONLY FROM THE NECK DOWN.  Do not use on open wounds or open sores.  Avoid contact with your eyes, ears, mouth and genitals (private parts).  Wash genitals (private parts) with your normal soap.  6.  Wash thoroughly, paying special attention to the area where your surgery will be performed.  7.  Thoroughly rinse your body with warm water from the neck down.  8.  DO NOT shower/wash with your normal soap after using and rinsing off the CHG Soap.  9.  Pat yourself dry with a clean towel.            10.  Wear  clean pajamas.            11.  Place clean sheets on your bed the night of your first shower and do not sleep with pets.  Day of Surgery  Do not apply any lotions/deoderants the morning of surgery.  Please wear clean clothes to the hospital/surgery center.  Please read over the following fact sheets that you were given. Pain Booklet, Coughing and Deep Breathing and Surgical Site Infection Prevention

## 2016-04-12 ENCOUNTER — Ambulatory Visit (HOSPITAL_COMMUNITY)
Admission: RE | Admit: 2016-04-12 | Discharge: 2016-04-12 | Disposition: A | Discharge status: Home / Self Care | Payer: Medicare Other | Source: Ambulatory Visit | Attending: General Surgery | Admitting: General Surgery

## 2016-04-12 ENCOUNTER — Ambulatory Visit (HOSPITAL_COMMUNITY): Payer: Medicare Other | Admitting: Vascular Surgery

## 2016-04-12 ENCOUNTER — Encounter (HOSPITAL_COMMUNITY): Payer: Self-pay | Admitting: *Deleted

## 2016-04-12 ENCOUNTER — Encounter (HOSPITAL_COMMUNITY)
Admission: RE | Disposition: A | Discharge status: Home / Self Care | Payer: Self-pay | Source: Ambulatory Visit | Attending: General Surgery

## 2016-04-12 ENCOUNTER — Ambulatory Visit (HOSPITAL_COMMUNITY): Payer: Medicare Other

## 2016-04-12 ENCOUNTER — Ambulatory Visit (HOSPITAL_COMMUNITY): Payer: Medicare Other | Admitting: Anesthesiology

## 2016-04-12 DIAGNOSIS — Z8673 Personal history of transient ischemic attack (TIA), and cerebral infarction without residual deficits: Secondary | ICD-10-CM | POA: Diagnosis not present

## 2016-04-12 DIAGNOSIS — Z8611 Personal history of tuberculosis: Secondary | ICD-10-CM | POA: Diagnosis not present

## 2016-04-12 DIAGNOSIS — F329 Major depressive disorder, single episode, unspecified: Secondary | ICD-10-CM | POA: Insufficient documentation

## 2016-04-12 DIAGNOSIS — Z419 Encounter for procedure for purposes other than remedying health state, unspecified: Secondary | ICD-10-CM

## 2016-04-12 DIAGNOSIS — Z170 Estrogen receptor positive status [ER+]: Secondary | ICD-10-CM | POA: Insufficient documentation

## 2016-04-12 DIAGNOSIS — I10 Essential (primary) hypertension: Secondary | ICD-10-CM | POA: Diagnosis not present

## 2016-04-12 DIAGNOSIS — Z79899 Other long term (current) drug therapy: Secondary | ICD-10-CM | POA: Diagnosis not present

## 2016-04-12 DIAGNOSIS — J449 Chronic obstructive pulmonary disease, unspecified: Secondary | ICD-10-CM | POA: Insufficient documentation

## 2016-04-12 DIAGNOSIS — F17210 Nicotine dependence, cigarettes, uncomplicated: Secondary | ICD-10-CM | POA: Insufficient documentation

## 2016-04-12 DIAGNOSIS — Z452 Encounter for adjustment and management of vascular access device: Secondary | ICD-10-CM | POA: Diagnosis not present

## 2016-04-12 DIAGNOSIS — Z95828 Presence of other vascular implants and grafts: Secondary | ICD-10-CM

## 2016-04-12 DIAGNOSIS — C50411 Malignant neoplasm of upper-outer quadrant of right female breast: Secondary | ICD-10-CM | POA: Diagnosis not present

## 2016-04-12 DIAGNOSIS — C50919 Malignant neoplasm of unspecified site of unspecified female breast: Secondary | ICD-10-CM | POA: Diagnosis not present

## 2016-04-12 DIAGNOSIS — I509 Heart failure, unspecified: Secondary | ICD-10-CM | POA: Diagnosis not present

## 2016-04-12 HISTORY — PX: PORTACATH PLACEMENT: SHX2246

## 2016-04-12 SURGERY — INSERTION, TUNNELED CENTRAL VENOUS DEVICE, WITH PORT
Anesthesia: General | Site: Chest

## 2016-04-12 MED ORDER — FENTANYL CITRATE (PF) 100 MCG/2ML IJ SOLN
INTRAMUSCULAR | Status: AC
  Filled 2016-04-12: qty 2

## 2016-04-12 MED ORDER — ONDANSETRON HCL 4 MG/2ML IJ SOLN: 4.0000 mg | Freq: Once | INTRAMUSCULAR | Status: DC | PRN

## 2016-04-12 MED ORDER — ACETAMINOPHEN 325 MG PO TABS: 650.0000 mg | ORAL_TABLET | ORAL | Status: DC | PRN

## 2016-04-12 MED ORDER — HEPARIN SOD (PORK) LOCK FLUSH 100 UNIT/ML IV SOLN
INTRAVENOUS | Status: DC | PRN
  Administered 2016-04-12: 450 [IU]

## 2016-04-12 MED ORDER — FENTANYL CITRATE (PF) 100 MCG/2ML IJ SOLN
25.0000 ug | INTRAMUSCULAR | Status: DC | PRN
  Administered 2016-04-12 (×2): 50 ug via INTRAVENOUS

## 2016-04-12 MED ORDER — CEFAZOLIN SODIUM-DEXTROSE 2-4 GM/100ML-% IV SOLN
2.0000 g | Freq: Once | INTRAVENOUS | Status: AC
  Administered 2016-04-12: 2 g via INTRAVENOUS
  Filled 2016-04-12: qty 100

## 2016-04-12 MED ORDER — HEPARIN SOD (PORK) LOCK FLUSH 100 UNIT/ML IV SOLN
INTRAVENOUS | Status: AC
  Filled 2016-04-12: qty 5

## 2016-04-12 MED ORDER — MIDAZOLAM HCL 2 MG/2ML IJ SOLN
INTRAMUSCULAR | Status: AC
  Filled 2016-04-12: qty 2

## 2016-04-12 MED ORDER — ONDANSETRON HCL 4 MG/2ML IJ SOLN
INTRAMUSCULAR | Status: AC
  Filled 2016-04-12: qty 2

## 2016-04-12 MED ORDER — SODIUM CHLORIDE 0.9 % IV SOLN: INTRAVENOUS | Status: DC

## 2016-04-12 MED ORDER — ONDANSETRON HCL 4 MG/2ML IJ SOLN
INTRAMUSCULAR | Status: DC | PRN
  Administered 2016-04-12: 4 mg via INTRAVENOUS

## 2016-04-12 MED ORDER — CEFAZOLIN SODIUM 1 G IJ SOLR
INTRAMUSCULAR | Status: AC
  Filled 2016-04-12: qty 20

## 2016-04-12 MED ORDER — OXYCODONE HCL 5 MG/5ML PO SOLN: 5.0000 mg | Freq: Once | ORAL | Status: DC | PRN

## 2016-04-12 MED ORDER — BUPIVACAINE HCL (PF) 0.25 % IJ SOLN
INTRAMUSCULAR | Status: AC
  Filled 2016-04-12: qty 30

## 2016-04-12 MED ORDER — ACETAMINOPHEN 650 MG RE SUPP: 650.0000 mg | RECTAL | Status: DC | PRN

## 2016-04-12 MED ORDER — PROPOFOL 10 MG/ML IV BOLUS
INTRAVENOUS | Status: AC
  Filled 2016-04-12: qty 20

## 2016-04-12 MED ORDER — OXYCODONE HCL 5 MG PO TABS: 5.0000 mg | ORAL_TABLET | ORAL | Status: DC | PRN

## 2016-04-12 MED ORDER — LACTATED RINGERS IV SOLN
INTRAVENOUS | Status: DC | PRN
  Administered 2016-04-12: 07:00:00 via INTRAVENOUS

## 2016-04-12 MED ORDER — SODIUM CHLORIDE 0.9% FLUSH: 3.0000 mL | INTRAVENOUS | Status: DC | PRN

## 2016-04-12 MED ORDER — SODIUM CHLORIDE 0.9% FLUSH: 3.0000 mL | Freq: Two times a day (BID) | INTRAVENOUS | Status: DC

## 2016-04-12 MED ORDER — MORPHINE SULFATE (PF) 2 MG/ML IV SOLN: 1.0000 mg | INTRAVENOUS | Status: DC | PRN

## 2016-04-12 MED ORDER — 0.9 % SODIUM CHLORIDE (POUR BTL) OPTIME
TOPICAL | Status: DC | PRN
  Administered 2016-04-12: 1000 mL

## 2016-04-12 MED ORDER — PROPOFOL 10 MG/ML IV BOLUS
INTRAVENOUS | Status: DC | PRN
  Administered 2016-04-12: 160 mg via INTRAVENOUS

## 2016-04-12 MED ORDER — SODIUM CHLORIDE 0.9 % IV SOLN: 250.0000 mL | INTRAVENOUS | Status: DC | PRN

## 2016-04-12 MED ORDER — BUPIVACAINE HCL (PF) 0.25 % IJ SOLN
INTRAMUSCULAR | Status: DC | PRN
  Administered 2016-04-12: 7 mL

## 2016-04-12 MED ORDER — OXYCODONE-ACETAMINOPHEN 10-325 MG PO TABS: 1.0000 | ORAL_TABLET | Freq: Four times a day (QID) | ORAL | 0 refills | Status: DC | PRN

## 2016-04-12 MED ORDER — LIDOCAINE 2% (20 MG/ML) 5 ML SYRINGE
INTRAMUSCULAR | Status: DC | PRN
  Administered 2016-04-12: 20 mg via INTRAVENOUS

## 2016-04-12 MED ORDER — MIDAZOLAM HCL 5 MG/5ML IJ SOLN
INTRAMUSCULAR | Status: DC | PRN
Start: 2016-04-12 — End: 2016-04-12
  Administered 2016-04-12: 2 mg via INTRAVENOUS

## 2016-04-12 MED ORDER — OXYCODONE HCL 5 MG PO TABS: 5.0000 mg | ORAL_TABLET | Freq: Once | ORAL | Status: DC | PRN

## 2016-04-12 MED ORDER — HEPARIN SODIUM (PORCINE) 5000 UNIT/ML IJ SOLN
INTRAMUSCULAR | Status: DC | PRN
  Administered 2016-04-12: 07:00:00

## 2016-04-12 MED ORDER — FENTANYL CITRATE (PF) 100 MCG/2ML IJ SOLN
INTRAMUSCULAR | Status: DC | PRN
  Administered 2016-04-12: 50 ug via INTRAVENOUS
  Administered 2016-04-12: 25 ug via INTRAVENOUS

## 2016-04-12 SURGICAL SUPPLY — 51 items
ADH SKN CLS APL DERMABOND .7 (GAUZE/BANDAGES/DRESSINGS) ×1
BAG DECANTER FOR FLEXI CONT (MISCELLANEOUS) ×3 IMPLANT
BLADE SURG 11 STRL SS (BLADE) ×3 IMPLANT
BLADE SURG 15 STRL LF DISP TIS (BLADE) ×1 IMPLANT
BLADE SURG 15 STRL SS (BLADE) ×3
CHLORAPREP W/TINT 26ML (MISCELLANEOUS) ×3 IMPLANT
COVER SURGICAL LIGHT HANDLE (MISCELLANEOUS) ×3 IMPLANT
COVER TRANSDUCER ULTRASND GEL (DRAPE) ×3 IMPLANT
CRADLE DONUT ADULT HEAD (MISCELLANEOUS) ×3 IMPLANT
DECANTER SPIKE VIAL GLASS SM (MISCELLANEOUS) ×3 IMPLANT
DERMABOND ADVANCED (GAUZE/BANDAGES/DRESSINGS) ×2
DERMABOND ADVANCED .7 DNX12 (GAUZE/BANDAGES/DRESSINGS) ×1 IMPLANT
DRAPE C-ARM 42X72 X-RAY (DRAPES) ×3 IMPLANT
DRAPE CHEST BREAST 15X10 FENES (DRAPES) ×3 IMPLANT
DRAPE UTILITY XL STRL (DRAPES) ×3 IMPLANT
ELECT CAUTERY BLADE 6.4 (BLADE) ×3 IMPLANT
ELECT REM PT RETURN 9FT ADLT (ELECTROSURGICAL) ×3
ELECTRODE REM PT RTRN 9FT ADLT (ELECTROSURGICAL) ×1 IMPLANT
GAUZE SPONGE 4X4 16PLY XRAY LF (GAUZE/BANDAGES/DRESSINGS) ×3 IMPLANT
GEL ULTRASOUND 20GR AQUASONIC (MISCELLANEOUS) ×3 IMPLANT
GLOVE BIO SURGEON STRL SZ7 (GLOVE) ×5 IMPLANT
GLOVE BIOGEL PI IND STRL 7.0 (GLOVE) IMPLANT
GLOVE BIOGEL PI IND STRL 7.5 (GLOVE) ×1 IMPLANT
GLOVE BIOGEL PI INDICATOR 7.0 (GLOVE) ×2
GLOVE BIOGEL PI INDICATOR 7.5 (GLOVE) ×2
GOWN STRL REUS W/ TWL LRG LVL3 (GOWN DISPOSABLE) ×2 IMPLANT
GOWN STRL REUS W/TWL LRG LVL3 (GOWN DISPOSABLE) ×6
INTRODUCER COOK 11FR (CATHETERS) IMPLANT
KIT BASIN OR (CUSTOM PROCEDURE TRAY) ×3 IMPLANT
KIT PORT POWER 8FR ISP CVUE (Catheter) ×2 IMPLANT
KIT ROOM TURNOVER OR (KITS) ×3 IMPLANT
NDL HYPO 25GX1X1/2 BEV (NEEDLE) ×1 IMPLANT
NEEDLE HYPO 25GX1X1/2 BEV (NEEDLE) ×3 IMPLANT
NS IRRIG 1000ML POUR BTL (IV SOLUTION) ×3 IMPLANT
PACK SURGICAL SETUP 50X90 (CUSTOM PROCEDURE TRAY) ×3 IMPLANT
PAD ARMBOARD 7.5X6 YLW CONV (MISCELLANEOUS) ×6 IMPLANT
PENCIL BUTTON HOLSTER BLD 10FT (ELECTRODE) ×3 IMPLANT
SET INTRODUCER 12FR PACEMAKER (SHEATH) IMPLANT
SET SHEATH INTRODUCER 10FR (MISCELLANEOUS) IMPLANT
SHEATH COOK PEEL AWAY SET 9F (SHEATH) IMPLANT
SUT MNCRL AB 4-0 PS2 18 (SUTURE) ×3 IMPLANT
SUT PROLENE 2 0 SH DA (SUTURE) ×3 IMPLANT
SUT SILK 2 0 (SUTURE)
SUT SILK 2-0 18XBRD TIE 12 (SUTURE) IMPLANT
SUT VIC AB 3-0 SH 27 (SUTURE) ×3
SUT VIC AB 3-0 SH 27XBRD (SUTURE) ×1 IMPLANT
SYR 20ML ECCENTRIC (SYRINGE) ×6 IMPLANT
SYR 5ML LUER SLIP (SYRINGE) ×3 IMPLANT
SYR CONTROL 10ML LL (SYRINGE) IMPLANT
TOWEL OR 17X24 6PK STRL BLUE (TOWEL DISPOSABLE) ×3 IMPLANT
TOWEL OR 17X26 10 PK STRL BLUE (TOWEL DISPOSABLE) ×3 IMPLANT

## 2016-04-12 NOTE — Transfer of Care (Signed)
Immediate Anesthesia Transfer of Care Note  Patient: Ashley Doyle  Procedure(s) Performed: Procedure(s): INSERTION PORT-A-CATH WITH [place] (N/A)  Patient Location: PACU  Anesthesia Type:General  Level of Consciousness: awake, alert  and oriented  Airway & Oxygen Therapy: Patient Spontanous Breathing and Patient connected to nasal cannula oxygen  Post-op Assessment: Report given to RN, Post -op Vital signs reviewed and stable and Patient moving all extremities X 4  Post vital signs: Reviewed and stable  Last Vitals:  Vitals:   04/12/16 0633 04/12/16 0837  BP: (!) 149/67 (!) 154/83  Pulse: 79 88  Resp: 18 16  Temp: 36.9 C 36.4 C    Last Pain:  Vitals:   04/12/16 0634  TempSrc:   PainSc: 4       Patients Stated Pain Goal: 4 ([id number])  Complications: No apparent anesthesia complications

## 2016-04-12 NOTE — Op Note (Signed)
Preoperative diagnosis: breast cancer need for venous access Postoperative diagnosis: same as above Procedure: right ij US guided powerport insertion Surgeon: Dr Serita Grammes EBL: minimal Anes: general  Specimens none Complications none Drains none Sponge count correct Dispo to pacu stable  Indications: This is a 63 yof with newly diagnosed right breast cancer. She is due to begin primary systemic chemotherapy. We discussed port placement.   Procedure: After informed consent was obtained the patient was taken to the operating room. She was given antibiotics. Sequential compression devices were on her legs. She was then placed under general anesthesia with an LMA. Then she was prepped and draped in the standard sterile surgical fashion. Surgical timeout was then performed.  I used the ultrasound to identify the right internal jugular vein. I then accessed the vein using the ultrasound. This aspirated blood. I then placed the wire.This was confirmed by fluoroscopy and ultrasound to be in the correct position. I created a pocket on the right chest.. I tunneled the line between the 2 sites. I then dilated the tract and placed the dilator assembly with the sheath. This was done under fluoroscopy. I then removed the sheath and dilator. The wire was also removed. The line was then pulled back to be in the venacava. I hooked this up to the port. I sutured this into place with 2-0 Prolene in 2 places. This aspirated blood and flushed easily.This was confirmed with a final fluoroscopy.  I then closed this with 2-0 Vicryl and 4-0 Monocryl. Dermabond was placed on both the incisions.A dressing was placed. She tolerated this well and was transferred to the recovery room in stable condition.

## 2016-04-12 NOTE — Anesthesia Preprocedure Evaluation (Addendum)
Anesthesia Evaluation  Patient identified by MRN, date of birth, ID band Patient awake    Reviewed: Allergy & Precautions, NPO status , Patient's Chart, lab work & pertinent test results  Airway Mallampati: II  TM Distance: >3 FB Neck ROM: Full    Dental  (+) Upper Dentures, Lower Dentures   Pulmonary Current Smoker,    breath sounds clear to auscultation       Cardiovascular hypertension,  Rhythm:Regular Rate:Normal     Neuro/Psych    GI/Hepatic   Endo/Other    Renal/GU      Musculoskeletal   Abdominal   Peds  Hematology   Anesthesia Other Findings   Reproductive/Obstetrics                           Anesthesia Physical Anesthesia Plan  ASA: III  Anesthesia Plan: MAC   Post-op Pain Management:    Induction: Intravenous  Airway Management Planned:   Additional Equipment:   Intra-op Plan:   Post-operative Plan:   Informed Consent: I have reviewed the patients History and Physical, chart, labs and discussed the procedure including the risks, benefits and alternatives for the proposed anesthesia with the patient or authorized representative who has indicated his/her understanding and acceptance.   Dental advisory given  Plan Discussed with: CRNA and Anesthesiologist  Anesthesia Plan Comments:         Anesthesia Quick Evaluation

## 2016-04-12 NOTE — Interval H&P Note (Signed)
History and Physical Interval Note:  04/12/2016 7:25 AM  Ashley Doyle  has presented today for surgery, with the diagnosis of BREAST CANCER  The various methods of treatment have been discussed with the patient and family. After consideration of risks, benefits and other options for treatment, the patient has consented to  Procedure(s): INSERTION PORT-A-CATH WITH [place] (N/A) as a surgical intervention .  The patient's history has been reviewed, patient examined, no change in status, stable for surgery.  I have reviewed the patient's chart and labs.  Questions were answered to the patient's satisfaction.     [name]

## 2016-04-12 NOTE — H&P (Signed)
Ashley Doyle is an 70 y.o. female.   Chief Complaint: breast cancer HPI: 74 yof referred by Dr Ashley Doyle presents with new right breast cancer. She has no fh and only personal history of abscess related to breast feeding. she noted a right breast mass that has given her some discomfort. she underwent mm/us that shows density c breasts. there is nl left breast. the right breast has a spiculated mass that on [place] measures 2.9x2.5x2.6 cm in size. there are 3 enlarged nodes the largest is 1.9 cm. 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 she now has mri also that shows 2.7 cm breast mass and axillary adenopathy without any other abnormality. she has no dc. she is here with her husband and her son today   Past Medical History:  Diagnosis Date  . Anxiety   . Arthritis   . Asthma   . Breast cancer ([place])   . COPD (chronic obstructive pulmonary disease) ([place])   . Depression   . Emphysema of lung ([place])   . Family history of adverse reaction to anesthesia    sister has PONV  . GERD (gastroesophageal reflux disease)   . HOH (hard of hearing)   . Hypertension   . Invasive ductal carcinoma of breast, female, right ([place]) 03/07/2016  . Pinched nerve in neck    and back  . Stroke ([place])   . Tuberculosis    "several years ago" 04/11/16  . Wears glasses   . Wears hearing aid    left ear    Past Surgical History:  Procedure Laterality Date  . ABDOMINAL HYSTERECTOMY    . APPENDECTOMY    . INNER EAR SURGERY Right     Family History  Problem Relation Age of Onset  . Stroke Mother   . Heart disease Mother   . Lung cancer Father    Social History:  reports that she has been smoking Cigarettes.  She has a 108.00 pack-year smoking history. She has never used smokeless tobacco. She reports that she does not drink alcohol or use drugs.  Allergies: No Known Allergies  Medications Prior to Admission  Medication Sig Dispense  Refill  . citalopram (CELEXA) 40 MG tablet Take 40 mg by mouth daily.  4  . HYDROcodone-acetaminophen (NORCO/VICODIN) 5-325 MG tablet Take 1 tablet by mouth 3 times daily as needed for pain  0  . triamterene-hydrochlorothiazide (MAXZIDE-25) 37.5-25 MG tablet Take 1 tablet by mouth daily.  1  . dexamethasone (DECADRON) 4 MG tablet Take 2 tablets by mouth once a day on the day after chemotherapy and then take 2 tablets two times a day for 2 days. Take with food. (Patient not taking: Reported on 04/12/2016) 30 tablet 1  . lidocaine-prilocaine (EMLA) cream Apply to affected area once (Patient not taking: Reported on 04/12/2016) 30 g 3  . ondansetron (ZOFRAN) 8 MG tablet Take 1 tablet (8 mg total) by mouth 2 (two) times daily as needed. Start on the third day after chemotherapy. (Patient not taking: Reported on 04/12/2016) 30 tablet 1  . prochlorperazine (COMPAZINE) 10 MG tablet Take 1 tablet (10 mg total) by mouth every 6 (six) hours as needed (Nausea or vomiting). (Patient not taking: Reported on 04/12/2016) 30 tablet 1    No results found for this or any previous visit (from the past 48 hour(s)). No results found.  ROS Negative  Blood pressure (!) 149/67, pulse 79, temperature  98.5 F (36.9 C), temperature source Oral, resp. rate 18, weight 82.1 kg (181 lb), SpO2 96 %. Physical Exam   Vitals (Sonya Bynum CMA; 04/03/2016 10:26 AM) 04/03/2016 10:26 AM Weight: 183 lb Height: 62in Body Surface Area: 1.84 m Body Mass Index: 33.47 kg/m  Temp.: 75F(Temporal)  Pulse: 81 (Regular)  BP: 142/82 (Sitting, Left Arm, Standard)   Physical Exam Rolm Bookbinder MD; 04/03/2016 10:58 AM) General Mental Status-Alert. Orientation-Oriented X3.  Chest and Lung Exam Chest and lung exam reveals -on auscultation, normal breath sounds, no adventitious sounds and normal vocal resonance.  Breast Nipples-No Discharge. Note: 2 cm uoq right breast mass with multiple enlarged axillary  nodes mobile    Cardiovascular Cardiovascular examination reveals -normal heart sounds, regular rate and rhythm with no murmurs.  Lymphatic Head & Neck  General Head & Neck Lymphatics: Bilateral - Description - Normal. Note: no [place] adenopathy   Assessment/Plan  Assessment & Plan Rolm Bookbinder MD; 04/03/2016 11:31 AM) BREAST CANCER OF UPPER-OUTER QUADRANT OF RIGHT FEMALE BREAST (C50.411) Story: Port placement for primary chemotherapy followed by surgery and radiation therapy We discussed the staging and pathophysiology of breast cancer. We discussed all of the different options for treatment for breast cancer including surgery, chemotherapy, radiation therapy, Herceptin, and antiestrogen therapy. We discussed the options for treatment of the breast cancer which included lumpectomy versus a mastectomy. We discussed the performance of the lumpectomy with radioactive seed placement. We discussed a 5-10% chance of a positive margin requiring reexcision in the operating room. We also discussed that she will likely need radiation therapy (this is usually 5-7 weeks) if she undergoes lumpectomy. The breast cannot undergo more radiation therapy in the same breast after lumpectomy in the future. We discussed the mastectomy (removal of whole breast) and the postoperative care for that as well. Mastectomy can be followed by reconstruction. This is a more extensive surgery and requires more recovery. The decision for lumpectomy vs mastectomy has no impact on decision for chemotherapy. Most mastectomy patients will not need radiation therapy. We discussed that there is no difference in her survival whether she undergoes lumpectomy with radiation therapy or antiestrogen therapy versus a mastectomy. There is also no real difference between her recurrence in the breast. she adamantly does not want mastectomy and I think this will be fine and may become easier with primary chemo. will discuss nodal surgery  at end of chemo once we see response. will get mri at end of chemo to assess response and I can see then. I discussed port and risks. will place next week.    Rolm Bookbinder, MD 04/12/2016, 6:55 AM

## 2016-04-12 NOTE — Anesthesia Procedure Notes (Signed)
Procedure Name: LMA Insertion Date/Time: 04/12/2016 7:45 AM Performed by: Rush Farmer E Pre-anesthesia Checklist: Patient identified, Emergency Drugs available and Suction available Patient Re-evaluated:Patient Re-evaluated prior to inductionOxygen Delivery Method: Circle system utilized Preoxygenation: Pre-oxygenation with 100% oxygen Intubation Type: IV induction LMA: LMA inserted LMA Size: 4.0 Number of attempts: 1 Placement Confirmation: positive ETCO2 and breath sounds checked- equal and bilateral Tube secured with: Tape Dental Injury: Teeth and Oropharynx as per pre-operative assessment

## 2016-04-12 NOTE — Discharge Instructions (Signed)
    PORT-A-CATH: POST OP INSTRUCTIONS  Always review your discharge instruction sheet given to you by the facility where your surgery was performed.   1. A prescription for pain medication may be given to you upon discharge. Take your pain medication as prescribed, if needed. If narcotic pain medicine is not needed, then you make take acetaminophen (Tylenol) or ibuprofen (Advil) as needed.  2. Take your usually prescribed medications unless otherwise directed. 3. If you need a refill on your pain medication, please contact our office. All narcotic pain medicine now requires a paper prescription.  Phoned in and fax refills are no longer allowed by law.  Prescriptions will not be filled after 5 pm or on weekends.  4. You should follow a light diet for the remainder of the day after your procedure. 5. Most patients will experience some mild swelling and/or bruising in the area of the incision. It may take several days to resolve. 6. It is common to experience some constipation if taking pain medication after surgery. Increasing fluid intake and taking a stool softener (such as Colace) will usually help or prevent this problem from occurring. A mild laxative (Milk of Magnesia or Miralax) should be taken according to package directions if there are no bowel movements after 48 hours.  7. Unless discharge instructions indicate otherwise, you may remove your bandages 48 hours after surgery, and you may shower at that time. You may have steri-strips (small white skin tapes) in place directly over the incision.  These strips should be left on the skin for 7-10 days.  If your surgeon used Dermabond (skin glue) on the incision, you may shower in 24 hours.  The glue will flake off over the next 2-3 weeks.  8. If your port is left accessed at the end of surgery (needle left in port), the dressing cannot get wet and should only by changed by a healthcare professional. When the port is no longer accessed (when the  needle has been removed), follow step 7.   9. ACTIVITIES:  Limit activity involving your arms for the next 72 hours. Do no strenuous exercise or activity for 1 week. You may drive when you are no longer taking prescription pain medication, you can comfortably wear a seatbelt, and you can maneuver your car. 10.You may need to see your doctor in the office for a follow-up appointment.  Please       check with your doctor.  11.When you receive a new Port-a-Cath, you will get a product guide and        ID card.  Please keep them in case you need them.  WHEN TO CALL YOUR DOCTOR ([phone]): 1. Fever over 101.0 2. Chills 3. Continued bleeding from incision 4. Increased redness and tenderness at the site 5. Shortness of breath, difficulty breathing   The clinic staff is available to answer your questions during regular business hours. Please don't hesitate to call and ask to speak to one of the nurses or medical assistants for clinical concerns. If you have a medical emergency, go to the nearest emergency room or call 911.  A surgeon from [facility] is always on call at the hospital.     For further information, please visit [email or web address]      

## 2016-04-12 NOTE — Anesthesia Postprocedure Evaluation (Signed)
Anesthesia Post Note  Patient: Ashley Doyle  Procedure(s) Performed: Procedure(s) (LRB): INSERTION PORT-A-CATH WITH [place] (N/A)  Patient location during evaluation: PACU Anesthesia Type: General Level of consciousness: awake, awake and alert and oriented Pain management: pain level controlled Vital Signs Assessment: post-procedure vital signs reviewed and stable Respiratory status: spontaneous breathing, nonlabored ventilation and respiratory function stable Cardiovascular status: blood pressure returned to baseline Anesthetic complications: no    Last Vitals:  Vitals:   04/12/16 0937 04/12/16 0945  BP: 136/69 (!) 148/67  Pulse: 75 81  Resp: 11   Temp: 36.5 C     Last Pain:  Vitals:   04/12/16 0903  TempSrc:   PainSc: 4                  Eletha Culbertson COKER

## 2016-04-13 ENCOUNTER — Encounter (HOSPITAL_COMMUNITY): Payer: Self-pay | Admitting: General Surgery

## 2016-04-16 ENCOUNTER — Encounter (HOSPITAL_COMMUNITY): Payer: Medicare Other | Attending: Oncology

## 2016-04-16 VITALS — BP 144/70 | HR 80 | Temp 97.5°F | Resp 18 | Wt 178.8 lb

## 2016-04-16 DIAGNOSIS — Z5111 Encounter for antineoplastic chemotherapy: Secondary | ICD-10-CM | POA: Diagnosis not present

## 2016-04-16 DIAGNOSIS — Z9221 Personal history of antineoplastic chemotherapy: Secondary | ICD-10-CM | POA: Insufficient documentation

## 2016-04-16 DIAGNOSIS — Z9889 Other specified postprocedural states: Secondary | ICD-10-CM | POA: Diagnosis not present

## 2016-04-16 DIAGNOSIS — I10 Essential (primary) hypertension: Secondary | ICD-10-CM | POA: Insufficient documentation

## 2016-04-16 DIAGNOSIS — F17210 Nicotine dependence, cigarettes, uncomplicated: Secondary | ICD-10-CM | POA: Diagnosis not present

## 2016-04-16 DIAGNOSIS — Z8052 Family history of malignant neoplasm of bladder: Secondary | ICD-10-CM | POA: Insufficient documentation

## 2016-04-16 DIAGNOSIS — Z8673 Personal history of transient ischemic attack (TIA), and cerebral infarction without residual deficits: Secondary | ICD-10-CM | POA: Insufficient documentation

## 2016-04-16 DIAGNOSIS — C50411 Malignant neoplasm of upper-outer quadrant of right female breast: Secondary | ICD-10-CM | POA: Insufficient documentation

## 2016-04-16 DIAGNOSIS — J449 Chronic obstructive pulmonary disease, unspecified: Secondary | ICD-10-CM | POA: Insufficient documentation

## 2016-04-16 DIAGNOSIS — E785 Hyperlipidemia, unspecified: Secondary | ICD-10-CM | POA: Diagnosis not present

## 2016-04-16 DIAGNOSIS — C773 Secondary and unspecified malignant neoplasm of axilla and upper limb lymph nodes: Secondary | ICD-10-CM | POA: Diagnosis not present

## 2016-04-16 DIAGNOSIS — C50911 Malignant neoplasm of unspecified site of right female breast: Secondary | ICD-10-CM

## 2016-04-16 DIAGNOSIS — Z800 Family history of malignant neoplasm of digestive organs: Secondary | ICD-10-CM | POA: Diagnosis not present

## 2016-04-16 LAB — CBC WITH DIFFERENTIAL/PLATELET
BASOS ABS: 0 10*3/uL (ref 0.0–0.1)
BASOS PCT: 1 %
EOS PCT: 3 %
Eosinophils Absolute: 0.2 10*3/uL (ref 0.0–0.7)
HEMATOCRIT: 42.9 % (ref 36.0–46.0)
Hemoglobin: 15 g/dL (ref 12.0–15.0)
LYMPHS PCT: 28 %
Lymphs Abs: 1.9 10*3/uL (ref 0.7–4.0)
MCH: 31.7 pg (ref 26.0–34.0)
MCHC: 35 g/dL (ref 30.0–36.0)
MCV: 90.7 fL (ref 78.0–100.0)
MONO ABS: 0.7 10*3/uL (ref 0.1–1.0)
MONOS PCT: 11 %
Neutro Abs: 3.8 10*3/uL (ref 1.7–7.7)
Neutrophils Relative %: 57 %
PLATELETS: 229 10*3/uL (ref 150–400)
RBC: 4.73 MIL/uL (ref 3.87–5.11)
RDW: 13.4 % (ref 11.5–15.5)
WBC: 6.6 10*3/uL (ref 4.0–10.5)

## 2016-04-16 LAB — COMPREHENSIVE METABOLIC PANEL
ALT: 26 U/L (ref 14–54)
ANION GAP: 8 (ref 5–15)
AST: 35 U/L (ref 15–41)
Albumin: 4.3 g/dL (ref 3.5–5.0)
Alkaline Phosphatase: 73 U/L (ref 38–126)
BILIRUBIN TOTAL: 0.8 mg/dL (ref 0.3–1.2)
BUN: 10 mg/dL (ref 6–20)
CHLORIDE: 99 mmol/L — AB (ref 101–111)
CO2: 27 mmol/L (ref 22–32)
Calcium: 9.3 mg/dL (ref 8.9–10.3)
Creatinine, Ser: 0.86 mg/dL (ref 0.44–1.00)
GFR calc Af Amer: >60 mL/min (ref >60)
Glucose, Bld: 134 mg/dL — ABNORMAL HIGH (ref 65–99)
POTASSIUM: 3.3 mmol/L — AB (ref 3.5–5.1)
Sodium: 134 mmol/L — ABNORMAL LOW (ref 135–145)
TOTAL PROTEIN: 7.3 g/dL (ref 6.5–8.1)

## 2016-04-16 MED ORDER — DOXORUBICIN HCL CHEMO IV INJECTION 2 MG/ML
60.0000 mg/m2 | Freq: Once | INTRAVENOUS | Status: AC
  Administered 2016-04-16: 116 mg via INTRAVENOUS
  Filled 2016-04-16: qty 58

## 2016-04-16 MED ORDER — PEGFILGRASTIM 6 MG/0.6ML ~~LOC~~ PSKT
6.0000 mg | PREFILLED_SYRINGE | Freq: Once | SUBCUTANEOUS | Status: AC
  Administered 2016-04-16: 6 mg via SUBCUTANEOUS
  Filled 2016-04-16: qty 0.6

## 2016-04-16 MED ORDER — SODIUM CHLORIDE 0.9 % IV SOLN
Freq: Once | INTRAVENOUS | Status: AC
  Administered 2016-04-16: 12:00:00 via INTRAVENOUS

## 2016-04-16 MED ORDER — PEGFILGRASTIM 6 MG/0.6ML ~~LOC~~ PSKT
PREFILLED_SYRINGE | SUBCUTANEOUS | Status: AC
  Filled 2016-04-16: qty 0.6

## 2016-04-16 MED ORDER — SODIUM CHLORIDE 0.9 % IV SOLN
Freq: Once | INTRAVENOUS | Status: AC
  Administered 2016-04-16: 12:00:00 via INTRAVENOUS
  Filled 2016-04-16: qty 5

## 2016-04-16 MED ORDER — HEPARIN SOD (PORK) LOCK FLUSH 100 UNIT/ML IV SOLN
500.0000 [IU] | Freq: Once | INTRAVENOUS | Status: AC | PRN
  Administered 2016-04-16: 500 [IU]
  Filled 2016-04-16: qty 5

## 2016-04-16 MED ORDER — PALONOSETRON HCL INJECTION 0.25 MG/5ML
0.2500 mg | Freq: Once | INTRAVENOUS | Status: AC
  Administered 2016-04-16: 0.25 mg via INTRAVENOUS
  Filled 2016-04-16: qty 5

## 2016-04-16 MED ORDER — SODIUM CHLORIDE 0.9% FLUSH
10.0000 mL | INTRAVENOUS | Status: DC | PRN
  Administered 2016-04-16: 10 mL
  Filled 2016-04-16: qty 10

## 2016-04-16 MED ORDER — CYCLOPHOSPHAMIDE CHEMO INJECTION 1 GM
600.0000 mg/m2 | Freq: Once | INTRAMUSCULAR | Status: AC
  Administered 2016-04-16: 1160 mg via INTRAVENOUS
  Filled 2016-04-16: qty 50

## 2016-04-16 NOTE — Progress Notes (Signed)
Candie Mile tolerated chemo tx with Neulasta on-pro well without complaints or incident.Labs, Echo and CXR for port placement reviewed prior to administering chemotherapy. Ejection fraction 55-60%. Port site bruised and tender from recent port placement 4 days ago. Reviewed chemo medications and Neulasta side effects with pt and husband who verbalized understanding. Neulasta on-pro applied to left arm with green indicator light flashing. Pt and husband instructed to remove Neulasta tomorrow at 6:15 pm, understanding verbalized. VSS upon discharge. Pt discharged self ambulatory in satisfactory condition with husband

## 2016-04-16 NOTE — Patient Instructions (Signed)
[facility] Discharge Instructions for Patients Receiving Chemotherapy   Beginning January 23rd 2017 lab work for the Cancer Center will be done in the  Main lab at [facility] on 1st floor. If you have a lab appointment with the Cancer Center please come in thru the  Main Entrance and check in at the main information desk   Today you received the following chemotherapy agents Adriamycin and Cytoxan. Follow-up as scheduled. Call clinic for any questions or concerns  To help prevent nausea and vomiting after your treatment, we encourage you to take your nausea medication   If you develop nausea and vomiting, or diarrhea that is not controlled by your medication, call the clinic.  The clinic phone number is [phone]. Office hours are Monday-Friday 8:30am-5:00pm.  BELOW ARE SYMPTOMS THAT SHOULD BE REPORTED IMMEDIATELY:  *FEVER GREATER THAN 101.0 F  *CHILLS WITH OR WITHOUT FEVER  NAUSEA AND VOMITING THAT IS NOT CONTROLLED WITH YOUR NAUSEA MEDICATION  *UNUSUAL SHORTNESS OF BREATH  *UNUSUAL BRUISING OR BLEEDING  TENDERNESS IN MOUTH AND THROAT WITH OR WITHOUT PRESENCE OF ULCERS  *URINARY PROBLEMS  *BOWEL PROBLEMS  UNUSUAL RASH Items with * indicate a potential emergency and should be followed up as soon as possible. If you have an emergency after office hours please contact your primary care physician or go to the nearest emergency department.  Please call the clinic during office hours if you have any questions or concerns.   You may also contact the Patient Navigator at [phone] should you have any questions or need assistance in obtaining follow up care.      Resources For Cancer Patients and their Caregivers ? American Cancer Society: Can assist with transportation, wigs, general needs, runs Look Good Feel Better.        1-888-227-6333 ? Cancer Care: Provides financial assistance, online support groups, medication/co-pay assistance.   1-800-813-HOPE (4673) ? [facility] Assists [place] cancer patients and their families through emotional , educational and financial support.  336-427-4357 ? [place] DSS Where to apply for food stamps, Medicaid and utility assistance. 336-342-1394 ? [facility]: Transportation to medical appointments. 336-347-2287 ? Social Security Administration: May apply for disability if have a Stage IV cancer. 336-342-7796 1-800-772-1213 ? [place] [facility]ging, Disability and Transit Services: Assists with nutrition, care and transit needs. 336-349-2343         

## 2016-04-17 ENCOUNTER — Telehealth (HOSPITAL_COMMUNITY): Payer: Self-pay

## 2016-04-17 NOTE — Telephone Encounter (Signed)
Follow-up call after chemo tx yesterday. Pt did not sleep well last night and woke up with a headache this morning which she took Tylenol for. Pt denied any nausea or other issues at this time. Pt instructed to call for any further questions or concerns and verbalized understanding

## 2016-04-18 ENCOUNTER — Other Ambulatory Visit (HOSPITAL_COMMUNITY): Payer: Self-pay | Admitting: Emergency Medicine

## 2016-04-18 DIAGNOSIS — C50911 Malignant neoplasm of unspecified site of right female breast: Secondary | ICD-10-CM

## 2016-04-18 MED ORDER — ONDANSETRON HCL 8 MG PO TABS: 8.0000 mg | ORAL_TABLET | Freq: Two times a day (BID) | ORAL | 1 refills | Status: DC | PRN

## 2016-04-25 ENCOUNTER — Encounter (HOSPITAL_BASED_OUTPATIENT_CLINIC_OR_DEPARTMENT_OTHER): Payer: Medicare Other | Admitting: Hematology & Oncology

## 2016-04-25 ENCOUNTER — Encounter (HOSPITAL_COMMUNITY): Payer: Self-pay | Admitting: Hematology & Oncology

## 2016-04-25 ENCOUNTER — Encounter (HOSPITAL_COMMUNITY): Payer: Medicare Other

## 2016-04-25 VITALS — BP 136/60 | HR 86 | Temp 98.1°F | Resp 16 | Wt 177.9 lb

## 2016-04-25 DIAGNOSIS — I10 Essential (primary) hypertension: Secondary | ICD-10-CM | POA: Diagnosis not present

## 2016-04-25 DIAGNOSIS — Z8673 Personal history of transient ischemic attack (TIA), and cerebral infarction without residual deficits: Secondary | ICD-10-CM | POA: Diagnosis not present

## 2016-04-25 DIAGNOSIS — C50911 Malignant neoplasm of unspecified site of right female breast: Secondary | ICD-10-CM | POA: Diagnosis not present

## 2016-04-25 DIAGNOSIS — C773 Secondary and unspecified malignant neoplasm of axilla and upper limb lymph nodes: Secondary | ICD-10-CM

## 2016-04-25 DIAGNOSIS — J449 Chronic obstructive pulmonary disease, unspecified: Secondary | ICD-10-CM | POA: Diagnosis not present

## 2016-04-25 DIAGNOSIS — E785 Hyperlipidemia, unspecified: Secondary | ICD-10-CM | POA: Diagnosis not present

## 2016-04-25 DIAGNOSIS — F418 Other specified anxiety disorders: Secondary | ICD-10-CM

## 2016-04-25 DIAGNOSIS — Z170 Estrogen receptor positive status [ER+]: Secondary | ICD-10-CM

## 2016-04-25 DIAGNOSIS — C50411 Malignant neoplasm of upper-outer quadrant of right female breast: Secondary | ICD-10-CM | POA: Diagnosis not present

## 2016-04-25 DIAGNOSIS — K760 Fatty (change of) liver, not elsewhere classified: Secondary | ICD-10-CM | POA: Diagnosis not present

## 2016-04-25 DIAGNOSIS — Z9221 Personal history of antineoplastic chemotherapy: Secondary | ICD-10-CM | POA: Diagnosis not present

## 2016-04-25 DIAGNOSIS — Z720 Tobacco use: Secondary | ICD-10-CM | POA: Diagnosis not present

## 2016-04-25 DIAGNOSIS — G893 Neoplasm related pain (acute) (chronic): Secondary | ICD-10-CM

## 2016-04-25 LAB — COMPREHENSIVE METABOLIC PANEL
ALK PHOS: 75 U/L (ref 38–126)
ALT: 18 U/L (ref 14–54)
AST: 13 U/L — AB (ref 15–41)
Albumin: 3.8 g/dL (ref 3.5–5.0)
Anion gap: 7 (ref 5–15)
BUN: 7 mg/dL (ref 6–20)
CALCIUM: 9.1 mg/dL (ref 8.9–10.3)
CHLORIDE: 97 mmol/L — AB (ref 101–111)
CO2: 28 mmol/L (ref 22–32)
CREATININE: 0.9 mg/dL (ref 0.44–1.00)
GFR calc Af Amer: >60 mL/min (ref >60)
Glucose, Bld: 115 mg/dL — ABNORMAL HIGH (ref 65–99)
Potassium: 3.7 mmol/L (ref 3.5–5.1)
Sodium: 132 mmol/L — ABNORMAL LOW (ref 135–145)
Total Bilirubin: 0.6 mg/dL (ref 0.3–1.2)
Total Protein: 6.4 g/dL — ABNORMAL LOW (ref 6.5–8.1)

## 2016-04-25 LAB — CBC WITH DIFFERENTIAL/PLATELET
Basophils Absolute: 0 10*3/uL (ref 0.0–0.1)
Basophils Relative: 1 %
EOS PCT: 4 %
Eosinophils Absolute: 0.1 10*3/uL (ref 0.0–0.7)
HEMATOCRIT: 40 % (ref 36.0–46.0)
HEMOGLOBIN: 14 g/dL (ref 12.0–15.0)
LYMPHS PCT: 48 %
Lymphs Abs: 1 10*3/uL (ref 0.7–4.0)
MCH: 31.6 pg (ref 26.0–34.0)
MCHC: 35 g/dL (ref 30.0–36.0)
MCV: 90.3 fL (ref 78.0–100.0)
MONOS PCT: 10 %
Monocytes Absolute: 0.2 10*3/uL (ref 0.1–1.0)
NEUTROS PCT: 37 %
Neutro Abs: 0.8 10*3/uL — ABNORMAL LOW (ref 1.7–7.7)
PLATELETS: 77 10*3/uL — AB (ref 150–400)
RBC: 4.43 MIL/uL (ref 3.87–5.11)
RDW: 13 % (ref 11.5–15.5)
WBC: 2.1 10*3/uL — AB (ref 4.0–10.5)

## 2016-04-25 MED ORDER — OCTREOTIDE ACETATE 30 MG IM KIT
PACK | INTRAMUSCULAR | Status: AC
  Filled 2016-04-25: qty 1

## 2016-04-25 MED ORDER — ONDANSETRON HCL 8 MG PO TABS
8.0000 mg | ORAL_TABLET | Freq: Three times a day (TID) | ORAL | 1 refills | Status: DC | PRN
Start: 2016-04-25 — End: 2017-10-28

## 2016-04-25 MED ORDER — OXYCODONE-ACETAMINOPHEN 10-325 MG PO TABS: 1.0000 | ORAL_TABLET | ORAL | 0 refills | Status: DC | PRN

## 2016-04-25 NOTE — Progress Notes (Signed)
[facility] Hematology/Oncology Progress Note  Name: Ashley Doyle      MRN: [id number]    Date: 04/25/2016 Time:10:55 AM   REFERRING PHYSICIAN:  Wende Neighbors, MD (Primary Care Provider)  REASON FOR CONSULT:  Grade 2 invasive ductal carcinoma   DIAGNOSIS:  Invasive ductal carcinoma of right breast ER/PR +, HER2 NEGATIVE, with right axillary lymph node positive for disease.    Invasive ductal carcinoma of breast, female, right ([place])   02/21/2016 Mammogram    Irregular vascular mass in the right breast at 10 o'clock, 10 cm the nipple, measuring 2.9 x 2.5 x 2.6 cm. In the right axilla there is 3 discrete enlarged abnormal appearing lymph nodes. Largest, which lies lateral to the palpable mass, measures 1.9 x 1.3 x 1.9 cm.      02/28/2016 Procedure    Right needle core biopsy in 10:00 position and right axillary biopsy      02/28/2016 Procedure    Postprocedure mammogram for clip placement. Ribbon shaped clip well-positioned within the biopsied mass in the right breast at the 10 o'clock axis. Wing shaped clip is well-positioned within the biopsied lymph node as seen on postprocedure ultrasound evaluation.  Final Assessment: Post Procedure Mammograms for Marker Placement      03/01/2016 Pathology Results    Invasive ductal carcinoma with extracellular mucin of right breast mass and ductal carcinoma of right axillary lymph node.  Grade 2.  ER 100%, PR 100%, Ki-67 12%, HER2 NEGATIVE.      03/12/2016 Imaging    CT CAP- Right breast cancer with associated right axillary metastatic lymph nodes. 2. No evidence of distant metastatic disease. 3. Hepatic steatosis.      03/26/2016 Breast MRI    2.7 cm spiculated mass in the upper-outer quadrant of right breast corresponding with the invasive ductal carcinoma. Masses in the right axilla consistent with axillary adenopathy.      HISTORY OF PRESENT ILLNESS:   Ashley Doyle is a 70 y.o. female with a medical  history significant for tobacco abuse, hyperlipidemia, major depressive disorder, scistica, dysthymic disorder, esstenial HTN,  who is here for a follow-up invasive ductal carcinoma of right breast ER/PR +, HER2 NEGATIVE, with right axillary lymph node positive for disease. She met with Dr. Donne Hazel, notes that she felt very comfortable with her visit. She has had a port placed and has now completed cycle #1 of AC.   She is accompanied by her husband.   I personally reviewed and went over laboratory studies with the patient and her husband.  The results are noted within this dictation.   She does note some soreness at the port site. She has numbing cream. She was given oxycodone after port placement. She has ran out of oxycodone and states the hydrocodone is not enough.  Admits she has been doing pretty rough lately, with aching around her face, ears, neck, and jaws as well waves nausea and weakness. She also notes aching in her breast, shoulder blades, and under her right arm where the cancer is at. She notes the neck ache is something she has dealt with for a while but attributes the face, ear, and jaw ache to treatment. She had one episode of vomiting. She was able to fill only one of the two nausea pills prescribed because one was expensive. She is not taking any medication for stomach acid. She reports feeling nauseous after taking the steroid.  Reports her tongue is sore. She  thinks she is getting a knot on her gumline. She is using baking soda which helps.  She is eating.  She is sleeping well. Mood is ok.    Review of Systems  Constitutional: Negative for chills, fever and weight loss.  HENT: Positive for hearing loss.   Eyes: Negative.  Negative for blurred vision and double vision.  Respiratory: Negative.  Negative for cough.   Cardiovascular: Negative.  Negative for chest pain.  Gastrointestinal: Positive for nausea (in waves) and vomiting (one episode). Negative for constipation and  diarrhea.  Genitourinary: Negative.   Musculoskeletal: Positive for myalgias.  Skin: Negative.   Neurological: Positive for weakness (in waves).  Endo/Heme/Allergies: Negative.   Psychiatric/Behavioral: Negative.   14 point review of systems was performed and is negative except as detailed under history of present illness and above   PAST MEDICAL HISTORY:   Past Medical History:  Diagnosis Date  . Anxiety   . Arthritis   . Asthma   . Breast cancer ([place])   . COPD (chronic obstructive pulmonary disease) ([place])   . Depression   . Emphysema of lung ([place])   . Family history of adverse reaction to anesthesia    sister has PONV  . GERD (gastroesophageal reflux disease)   . HOH (hard of hearing)   . Hypertension   . Invasive ductal carcinoma of breast, female, right ([place]) 03/07/2016  . Pinched nerve in neck    and back  . Stroke ([place])   . Tuberculosis    "several years ago" 04/11/16  . Wears glasses   . Wears hearing aid    left ear    ALLERGIES: No Known Allergies    MEDICATIONS: I have reviewed the patient's current medications.    Current Outpatient Prescriptions on File Prior to Visit  Medication Sig Dispense Refill  . citalopram (CELEXA) 40 MG tablet Take 40 mg by mouth daily.  4  . dexamethasone (DECADRON) 4 MG tablet Take 2 tablets by mouth once a day on the day after chemotherapy and then take 2 tablets two times a day for 2 days. Take with food. (Patient not taking: Reported on 04/12/2016) 30 tablet 1  . HYDROcodone-acetaminophen (NORCO/VICODIN) 5-325 MG tablet Take 1 tablet by mouth 3 times daily as needed for pain  0  . lidocaine-prilocaine (EMLA) cream Apply to affected area once (Patient not taking: Reported on 04/12/2016) 30 g 3  . ondansetron (ZOFRAN) 8 MG tablet Take 1 tablet (8 mg total) by mouth 2 (two) times daily as needed. Start on the third day after chemotherapy. 30 tablet 1  . oxyCODONE-acetaminophen (PERCOCET) 10-325 MG tablet Take 1 tablet by mouth  every 6 (six) hours as needed for pain. 10 tablet 0  . prochlorperazine (COMPAZINE) 10 MG tablet Take 1 tablet (10 mg total) by mouth every 6 (six) hours as needed (Nausea or vomiting). (Patient not taking: Reported on 04/12/2016) 30 tablet 1  . triamterene-hydrochlorothiazide (MAXZIDE-25) 37.5-25 MG tablet Take 1 tablet by mouth daily.  1   No current facility-administered medications on file prior to visit.      PAST SURGICAL HISTORY Past Surgical History:  Procedure Laterality Date  . ABDOMINAL HYSTERECTOMY    . APPENDECTOMY    . INNER EAR SURGERY Right   . PORTACATH PLACEMENT N/A 04/12/2016   Procedure: INSERTION PORT-A-CATH WITH [place];  Surgeon: Rolm Bookbinder, MD;  Location: [facility];  Service: General;  Laterality: N/A;    FAMILY HISTORY: She reports a family history of cancer:  Bother deceased at the age of 69 secondary to colon cancer  Brother is survivor of bladder cancer  Sister is survivor with history of colon cancer requiring colectomy.  1 son is deceased at the age of 50 secondary to complications with cerebral palsy 1 son alive, living in [place].  He is a Curator. 1 Grandson 1 Great-granddaughter.  SOCIAL HISTORY:  reports that she has been smoking Cigarettes.  She has a 108.00 pack-year smoking history. She has never used smokeless tobacco. She reports that she does not drink alcohol or use drugs.   She is Psychologist, forensic in religion.  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  She is married x 24 years to current husband who is a cancer survivor himself of head and neck cancer treated by Dr. Isidore Moos (XRT) and Dr. Alvy Bimler (Medical Oncology); currently 1.5 year survivor.  Social History   Social History  . Marital status: Married    Spouse name: N/A  . Number of children: N/A  . Years of education: N/A   Social History Main Topics  . Smoking status: Current Every Day Smoker    Packs/day: 2.00    Years: 54.00    Types: Cigarettes  . Smokeless tobacco:  Never Used  . Alcohol use No  . Drug use: No  . Sexual activity: Not Asked     Comment: hysterectomy   Other Topics Concern  . None   Social History Narrative  . None    PERFORMANCE STATUS: The patient's performance status is 1 - Symptomatic but completely ambulatory  PHYSICAL EXAM: Most Recent Vital Signs: Blood pressure 136/60, pulse 86, temperature 98.1 F (36.7 C), temperature source Oral, resp. rate 16, weight 177 lb 14.4 oz (80.7 kg), SpO2 97 %. Physical Exam  Constitutional: She is oriented to person, place, and time and well-developed, well-nourished, and in no distress.  HENT:  Head: Normocephalic and atraumatic.  Mouth/Throat: Oropharynx is clear and moist.  Dry mucous membranes with erythema.   Eyes: Conjunctivae and EOM are normal. Pupils are equal, round, and reactive to light.  Disconjugate gaze   Neck: Normal range of motion. Neck supple.  Cardiovascular: Normal rate, regular rhythm and normal heart sounds.   Pulmonary/Chest: Effort normal and breath sounds normal.  Port on right side with bruising  Abdominal: Soft. Bowel sounds are normal.  Musculoskeletal: Normal range of motion.  Neurological: She is alert and oriented to person, place, and time. Gait normal.  Skin: Skin is warm and dry.  Nursing note and vitals reviewed.   LABORATORY DATA:  I have reviewed the data as listed. Results for orders placed or performed in visit on 04/25/16 (from the past 48 hour(s))  CBC with Differential     Status: Abnormal   Collection Time: 04/25/16  9:29 AM  Result Value Ref Range   WBC 2.1 (L) 4.0 - 10.5 K/uL   RBC 4.43 3.87 - 5.11 MIL/uL   Hemoglobin 14.0 12.0 - 15.0 g/dL   HCT 40.0 36.0 - 46.0 %   MCV 90.3 78.0 - 100.0 fL   MCH 31.6 26.0 - 34.0 pg   MCHC 35.0 30.0 - 36.0 g/dL   RDW 13.0 11.5 - 15.5 %   Platelets 77 (L) 150 - 400 K/uL    Comment: SPECIMEN CHECKED FOR CLOTS LARGE PLATELETS PRESENT PLATELET COUNT CONFIRMED BY SMEAR    Neutrophils Relative %  37 %   Lymphocytes Relative 48 %   Monocytes Relative 10 %   Eosinophils  Relative 4 %   Basophils Relative 1 %   Neutro Abs 0.8 (L) 1.7 - 7.7 K/uL   Lymphs Abs 1.0 0.7 - 4.0 K/uL   Monocytes Absolute 0.2 0.1 - 1.0 K/uL   Eosinophils Absolute 0.1 0.0 - 0.7 K/uL   Basophils Absolute 0.0 0.0 - 0.1 K/uL   WBC Morphology WHITE COUNT CONFIRMED ON SMEAR     Comment: TOXIC GRANULATION DOHLE BODIES   Comprehensive metabolic panel     Status: Abnormal   Collection Time: 04/25/16  9:29 AM  Result Value Ref Range   Sodium 132 (L) 135 - 145 mmol/L   Potassium 3.7 3.5 - 5.1 mmol/L   Chloride 97 (L) 101 - 111 mmol/L   CO2 28 22 - 32 mmol/L   Glucose, Bld 115 (H) 65 - 99 mg/dL   BUN 7 6 - 20 mg/dL   Creatinine, Ser 0.90 0.44 - 1.00 mg/dL   Calcium 9.1 8.9 - 10.3 mg/dL   Total Protein 6.4 (L) 6.5 - 8.1 g/dL   Albumin 3.8 3.5 - 5.0 g/dL   AST 13 (L) 15 - 41 U/L   ALT 18 14 - 54 U/L   Alkaline Phosphatase 75 38 - 126 U/L   Total Bilirubin 0.6 0.3 - 1.2 mg/dL   GFR calc non Af Amer >60 >60 mL/min   GFR calc Af Amer >60 >60 mL/min    Comment: (NOTE) The eGFR has been calculated using the CKD EPI equation. This calculation has not been validated in all clinical situations. eGFR's persistently <60 mL/min signify possible Chronic Kidney Disease.    Anion gap 7 5 - 15     RADIOGRAPHY: I have reviewed the data as listed below and agree with the findings. Study Result   CLINICAL DATA:  Port-A-Cath.  EXAM: PORTABLE CHEST 1 VIEW  COMPARISON:  CT 03/09/2016 .  FINDINGS: Port-A-Cath noted with tip over cavoatrial junction. Cardiomegaly with mild bilateral interstitial prominence consistent with congestive heart failure. Tiny bilateral pleural effusions cannot be excluded. No pneumothorax .  IMPRESSION: 1. Port-A-Cath noted with tip projected over the cavoatrial junction.  2. Congestive heart failure with pulmonary interstitial edema.   Electronically Signed   By: Marcello Moores   Register   On: 04/12/2016 09:24   Study Result   CLINICAL DATA:  Biopsy proven invasive ductal carcinoma with extracellular mucin in the 10 o'clock region of the right breast. Pathology of a right axillary mass revealed ductal carcinoma. No lymphoid tissue was seen in this mass and it is thought to be a lymph node replaced by tumor.  LABS:  None obtained at the time of imaging.  EXAM: BILATERAL BREAST MRI WITH AND WITHOUT CONTRAST  TECHNIQUE: Multiplanar, multisequence MR images of both breasts were obtained prior to and following the intravenous administration of 17 ml of MultiHance.  THREE-DIMENSIONAL MR IMAGE RENDERING ON INDEPENDENT WORKSTATION:  Three-dimensional MR images were rendered by post-processing of the original MR data on an independent workstation. The three-dimensional MR images were interpreted, and findings are reported in the following complete MRI report for this study. Three dimensional images were evaluated at the independent DynaCad workstation  COMPARISON:  Previous exam(s).  FINDINGS: Breast composition: b. Scattered fibroglandular tissue.  Background parenchymal enhancement: Moderate enhancing fibronodular pattern, bilaterally.  Right breast: In the posterior third of the upper-outer quadrant of the right breast is an enhancing irregular mass measuring 2.9 x 3.1 x 3.1 cm. It is associated with a signal void artifact from the biopsy clip.  Left  breast: No mass or abnormal enhancement.  Lymph nodes: Directly posterior to the spiculated mass in the right breast is a 2.7 x 1.3 x 1.7 cm mass associated with a signal void artifact. This corresponds with the second mass biopsied. No lymphoid tissue was seen in this mass pathologically. It is thought to be a lymph node completely replaced by tumor. Adjacent to this are 2 smaller masses measure 1.7 and 1.0 cm consistent with metastatic lymph nodes. No abnormal lymph nodes are seen in the  left axilla.  Ancillary findings:  None.  IMPRESSION: 2.7 cm spiculated mass in the upper-outer quadrant of right breast corresponding with the invasive ductal carcinoma. Masses in the right axilla consistent with axillary adenopathy.  RECOMMENDATION: Treatment planning of the biopsy proven right breast cancer and axillary metastasis is recommended.  BI-RADS CATEGORY  6: Known biopsy-proven malignancy.   Electronically Signed   By: Lillia Mountain M.D.   On: 03/26/2016 10:01    PATHOLOGY:   Diagnosis 1. Breast, right, needle core biopsy, 10:00 - INVASIVE DUCTAL CARCINOMA WITH EXTRACELLULAR MUCIN. Estrogen Receptor: 100%, POSITIVE, STRONG STAINING INTENSITY Progesterone Receptor: 100%, POSITIVE, STRONG STAINING INTENSITY Proliferation Marker Ki67: 12% HER2 - NEGATIVE  2. Lymph node, needle/core biopsy, right axilla - DUCTAL CARCINOMA. - SEE MICROSCOPIC DESCRIPTION. Estrogen Receptor: 100%, POSITIVE, STRONG STAINING INTENSITY Progesterone Receptor: 100%, POSITIVE, STRONG STAINING INTENSITY Proliferation Marker Ki67: 15% HER2 - NEGATIVE  ASSESSMENT/PLAN:  Invasive ductal carcinoma of breast, female, right upper outer quadrant ER/PR +, HER2 NEGATIVE  Right axillary lymph node positive for disease (R axillary mets) Tobacco Abuse Hepatic Steatosis Port placement  I personally reviewed and went over laboratory studies with the patient.  The results are noted within this dictation.  Neutropenic precautions were reviewed. She did receive neulasta.   I have written a prescription for oxycodone to manage pain. She was given Nexium samples for stomach acid. Advised she can discontinue decadron. (I advised her that she does not need to be on it currently although it can help nausea)  I have written instructions for which medications to take.   She was only able to fill only one of the two nausea pills prescribed because one was expensive. We will work on getting her zofran  filled via the cancer center at [facility].  I reviewed the side effects to watch out for while on treatment again with the patient and her husband. I reminded them to call.   She will return for follow up with Kirby Crigler, PA-C on 05/15/2016. She is due for therapy next week.   Meds ordered this encounter  Medications  . oxyCODONE-acetaminophen (PERCOCET) 10-325 MG tablet    Sig: Take 1 tablet by mouth every 4 (four) hours as needed for pain.    Dispense:  30 tablet    Refill:  0  . ondansetron (ZOFRAN) 8 MG tablet    Sig: Take 1 tablet (8 mg total) by mouth every 8 (eight) hours as needed. Start on the third day after chemotherapy.    Dispense:  30 tablet    Refill:  1    All questions were answered. The patient knows to call the clinic with any problems, questions or concerns. We can certainly see the patient much sooner if necessary.  This document serves as a record of services personally performed by Ancil Linsey, MD. It was created on her behalf by Arlyce Harman, a trained medical scribe. The creation of this record is based on the scribe's personal observations and  the provider's statements to them. This document has been checked and approved by the attending provider.  I have reviewed the above documentation for accuracy and completeness and I agree with the above.  This note is electronically signed by: Molli Hazard, MD   04/25/2016 10:55 AM

## 2016-04-25 NOTE — Patient Instructions (Addendum)
[place] at [facility]  Discharge Instructions:  You were seen today by Dr. Whitney Muse. Remember to stop by [facility] and get your zofran. Stop taking the Decadron. Return as scheduled.  You are neutropenic:  Your white blood cell (WBC) count is low.  Please make sure you:  Wash your hands.  Avoid big crowds or people that you know are sick.  If your tempeture is above 100.5 then take tylenol and recheck your tempeture in 1 hour, if it is still above 100.5 call the clinic or if it is after hours please go to the ER.  Wash all fruits and vegetables good before eating them.  Wear a mask if you go out into public.       _______________________________________________________________  Thank you for choosing [place] at [facility] to provide your oncology and hematology care.  To afford each patient quality time with our providers, please arrive at least 15 minutes before your scheduled appointment.  You need to re-schedule your appointment if you arrive 10 or more minutes late.  We strive to give you quality time with our providers, and arriving late affects you and other patients whose appointments are after yours.  Also, if you no show three or more times for appointments you may be dismissed from the clinic.  Again, thank you for choosing [place] at [facility] hope is that these requests will allow you access to exceptional care and in a timely manner. _______________________________________________________________  If you have questions after your visit, please contact our office at [phone] between the hours of 8:30 a.m. and 5:00 p.m. Voicemails left after 4:30 p.m. will not be returned until the following business day. _______________________________________________________________  For prescription refill requests, have your pharmacy contact our  office. _______________________________________________________________  Recommendations made by the consultant and any test results will be sent to your referring physician. _______________________________________________________________

## 2016-04-30 ENCOUNTER — Encounter (HOSPITAL_COMMUNITY): Payer: Self-pay

## 2016-04-30 ENCOUNTER — Encounter (HOSPITAL_BASED_OUTPATIENT_CLINIC_OR_DEPARTMENT_OTHER): Payer: Medicare Other

## 2016-04-30 VITALS — BP 115/51 | HR 105 | Temp 97.8°F | Resp 16 | Wt 177.4 lb

## 2016-04-30 DIAGNOSIS — C773 Secondary and unspecified malignant neoplasm of axilla and upper limb lymph nodes: Secondary | ICD-10-CM

## 2016-04-30 DIAGNOSIS — Z5111 Encounter for antineoplastic chemotherapy: Secondary | ICD-10-CM

## 2016-04-30 DIAGNOSIS — E785 Hyperlipidemia, unspecified: Secondary | ICD-10-CM | POA: Diagnosis not present

## 2016-04-30 DIAGNOSIS — C50911 Malignant neoplasm of unspecified site of right female breast: Secondary | ICD-10-CM | POA: Diagnosis not present

## 2016-04-30 DIAGNOSIS — I10 Essential (primary) hypertension: Secondary | ICD-10-CM | POA: Diagnosis not present

## 2016-04-30 DIAGNOSIS — Z8673 Personal history of transient ischemic attack (TIA), and cerebral infarction without residual deficits: Secondary | ICD-10-CM | POA: Diagnosis not present

## 2016-04-30 DIAGNOSIS — C50411 Malignant neoplasm of upper-outer quadrant of right female breast: Secondary | ICD-10-CM | POA: Diagnosis not present

## 2016-04-30 DIAGNOSIS — Z9221 Personal history of antineoplastic chemotherapy: Secondary | ICD-10-CM | POA: Diagnosis not present

## 2016-04-30 DIAGNOSIS — J449 Chronic obstructive pulmonary disease, unspecified: Secondary | ICD-10-CM | POA: Diagnosis not present

## 2016-04-30 LAB — CBC WITH DIFFERENTIAL/PLATELET
Basophils Absolute: 0 10*3/uL (ref 0.0–0.1)
Basophils Relative: 0 %
EOS ABS: 0 10*3/uL (ref 0.0–0.7)
Eosinophils Relative: 0 %
HEMATOCRIT: 35.8 % — AB (ref 36.0–46.0)
HEMOGLOBIN: 12.6 g/dL (ref 12.0–15.0)
LYMPHS ABS: 2 10*3/uL (ref 0.7–4.0)
LYMPHS PCT: 16 %
MCH: 31.8 pg (ref 26.0–34.0)
MCHC: 35.2 g/dL (ref 30.0–36.0)
MCV: 90.4 fL (ref 78.0–100.0)
MONOS PCT: 7 %
Monocytes Absolute: 0.9 10*3/uL (ref 0.1–1.0)
NEUTROS ABS: 9.4 10*3/uL — AB (ref 1.7–7.7)
NEUTROS PCT: 77 %
Platelets: 163 10*3/uL (ref 150–400)
RBC: 3.96 MIL/uL (ref 3.87–5.11)
RDW: 13.6 % (ref 11.5–15.5)
WBC: 12.4 10*3/uL — ABNORMAL HIGH (ref 4.0–10.5)

## 2016-04-30 LAB — COMPREHENSIVE METABOLIC PANEL
ALK PHOS: 92 U/L (ref 38–126)
ALT: 19 U/L (ref 14–54)
ANION GAP: 8 (ref 5–15)
AST: 17 U/L (ref 15–41)
Albumin: 3.6 g/dL (ref 3.5–5.0)
BILIRUBIN TOTAL: 0.5 mg/dL (ref 0.3–1.2)
BUN: 9 mg/dL (ref 6–20)
CALCIUM: 8.1 mg/dL — AB (ref 8.9–10.3)
CO2: 26 mmol/L (ref 22–32)
CREATININE: 0.79 mg/dL (ref 0.44–1.00)
Chloride: 99 mmol/L — ABNORMAL LOW (ref 101–111)
Glucose, Bld: 117 mg/dL — ABNORMAL HIGH (ref 65–99)
Potassium: 2.9 mmol/L — ABNORMAL LOW (ref 3.5–5.1)
Sodium: 133 mmol/L — ABNORMAL LOW (ref 135–145)
TOTAL PROTEIN: 6 g/dL — AB (ref 6.5–8.1)

## 2016-04-30 LAB — MAGNESIUM: Magnesium: 1.8 mg/dL (ref 1.7–2.4)

## 2016-04-30 MED ORDER — PALONOSETRON HCL INJECTION 0.25 MG/5ML
0.2500 mg | Freq: Once | INTRAVENOUS | Status: AC
  Administered 2016-04-30: 0.25 mg via INTRAVENOUS
  Filled 2016-04-30: qty 5

## 2016-04-30 MED ORDER — POTASSIUM CHLORIDE CRYS ER 20 MEQ PO TBCR
60.0000 meq | EXTENDED_RELEASE_TABLET | Freq: Two times a day (BID) | ORAL | Status: DC
  Administered 2016-04-30: 60 meq via ORAL
  Filled 2016-04-30: qty 3

## 2016-04-30 MED ORDER — HEPARIN SOD (PORK) LOCK FLUSH 100 UNIT/ML IV SOLN
500.0000 [IU] | Freq: Once | INTRAVENOUS | Status: AC | PRN
  Administered 2016-04-30: 500 [IU]
  Filled 2016-04-30: qty 5

## 2016-04-30 MED ORDER — PEGFILGRASTIM 6 MG/0.6ML ~~LOC~~ PSKT
6.0000 mg | PREFILLED_SYRINGE | Freq: Once | SUBCUTANEOUS | Status: AC
  Administered 2016-04-30: 6 mg via SUBCUTANEOUS
  Filled 2016-04-30: qty 0.6

## 2016-04-30 MED ORDER — POTASSIUM CHLORIDE CRYS ER 20 MEQ PO TBCR: 40.0000 meq | EXTENDED_RELEASE_TABLET | Freq: Two times a day (BID) | ORAL | 1 refills | Status: DC

## 2016-04-30 MED ORDER — SODIUM CHLORIDE 0.9 % IV SOLN
Freq: Once | INTRAVENOUS | Status: AC
  Administered 2016-04-30: 12:00:00 via INTRAVENOUS
  Filled 2016-04-30: qty 5

## 2016-04-30 MED ORDER — SODIUM CHLORIDE 0.9% FLUSH
10.0000 mL | INTRAVENOUS | Status: DC | PRN
  Administered 2016-04-30: 10 mL
  Filled 2016-04-30: qty 10

## 2016-04-30 MED ORDER — SODIUM CHLORIDE 0.9 % IV SOLN
Freq: Once | INTRAVENOUS | Status: AC
  Administered 2016-04-30: 11:00:00 via INTRAVENOUS

## 2016-04-30 MED ORDER — DOXORUBICIN HCL CHEMO IV INJECTION 2 MG/ML
60.0000 mg/m2 | Freq: Once | INTRAVENOUS | Status: AC
  Administered 2016-04-30: 116 mg via INTRAVENOUS
  Filled 2016-04-30: qty 58

## 2016-04-30 MED ORDER — SODIUM CHLORIDE 0.9 % IV SOLN
600.0000 mg/m2 | Freq: Once | INTRAVENOUS | Status: AC
  Administered 2016-04-30: 1160 mg via INTRAVENOUS
  Filled 2016-04-30: qty 8

## 2016-04-30 NOTE — Patient Instructions (Signed)
[facility] Discharge Instructions for Patients Receiving Chemotherapy   Beginning January 23rd 2017 lab work for the [facility] will be done in the  Main lab at [facility] on 1st floor. If you have a lab appointment with the [facility] please come in thru the  Main Entrance and check in at the main information desk   Today you received the following chemotherapy agents AC  To help prevent nausea and vomiting after your treatment, we encourage you to take your nausea medication     If you develop nausea and vomiting, or diarrhea that is not controlled by your medication, call the clinic.  The clinic phone number is [phone]. Office hours are Monday-Friday 8:30am-5:00pm.  BELOW ARE SYMPTOMS THAT SHOULD BE REPORTED IMMEDIATELY:  *FEVER GREATER THAN 101.0 F  *CHILLS WITH OR WITHOUT FEVER  NAUSEA AND VOMITING THAT IS NOT CONTROLLED WITH YOUR NAUSEA MEDICATION  *UNUSUAL SHORTNESS OF BREATH  *UNUSUAL BRUISING OR BLEEDING  TENDERNESS IN MOUTH AND THROAT WITH OR WITHOUT PRESENCE OF ULCERS  *URINARY PROBLEMS  *BOWEL PROBLEMS  UNUSUAL RASH Items with * indicate a potential emergency and should be followed up as soon as possible. If you have an emergency after office hours please contact your primary care physician or go to the nearest emergency department.  Please call the clinic during office hours if you have any questions or concerns.   You may also contact the Patient Navigator at [phone] should you have any questions or need assistance in obtaining follow up care.      Resources For Cancer Patients and their Caregivers ? American Cancer Society: Can assist with transportation, wigs, general needs, runs Look Good Feel Better.        724-082-4432 ? Cancer Care: Provides financial assistance, online support groups, medication/co-pay assistance.  1-800-813-HOPE 603-050-6351) ? [place] Assists [place] cancer  patients and their families through emotional , educational and financial support.  417-362-2258 ? [place] DSS Where to apply for food stamps, Medicaid and utility assistance. (479) 841-6223 ? [facility]: Transportation to medical appointments. 520 428 2782 ? Social Security Administration: May apply for disability if have a Stage IV cancer. (220)283-8576 334-382-7107 ? [employer], Disability and Transit Services: Assists with nutrition, care and transit needs. 669-092-2520

## 2016-04-30 NOTE — Progress Notes (Signed)
Labs reviewed with Kirby Crigler PA-C. Will proceed with treatment and give potassium as ordered. Sent in a script for potassium at home also.    Magnesium drawn extra per Kirby Crigler PA-C  Chemotherapy given to day per orders. Patient tolerated well no problems. Onpro device administered and instructed on time of administration. Vitals stable and discharged home from clinic ambulatory.follow up as scheduled.

## 2016-05-08 ENCOUNTER — Ambulatory Visit (HOSPITAL_COMMUNITY): Payer: Medicare Other | Admitting: Oncology

## 2016-05-08 ENCOUNTER — Ambulatory Visit (HOSPITAL_COMMUNITY): Payer: Medicare Other

## 2016-05-09 ENCOUNTER — Other Ambulatory Visit (HOSPITAL_COMMUNITY): Payer: Self-pay | Admitting: Emergency Medicine

## 2016-05-09 MED ORDER — OXYCODONE-ACETAMINOPHEN 10-325 MG PO TABS: 1.0000 | ORAL_TABLET | ORAL | 0 refills | Status: DC | PRN

## 2016-05-15 ENCOUNTER — Encounter (HOSPITAL_BASED_OUTPATIENT_CLINIC_OR_DEPARTMENT_OTHER): Payer: Medicare HMO | Admitting: Oncology

## 2016-05-15 ENCOUNTER — Encounter (HOSPITAL_COMMUNITY): Payer: Medicare HMO | Attending: Oncology

## 2016-05-15 VITALS — BP 115/54 | HR 71 | Temp 98.0°F | Resp 18

## 2016-05-15 VITALS — BP 139/64 | HR 78 | Temp 97.8°F | Resp 20 | Wt 173.7 lb

## 2016-05-15 DIAGNOSIS — Z170 Estrogen receptor positive status [ER+]: Secondary | ICD-10-CM

## 2016-05-15 DIAGNOSIS — Z8052 Family history of malignant neoplasm of bladder: Secondary | ICD-10-CM | POA: Diagnosis not present

## 2016-05-15 DIAGNOSIS — Z9889 Other specified postprocedural states: Secondary | ICD-10-CM | POA: Insufficient documentation

## 2016-05-15 DIAGNOSIS — Z9221 Personal history of antineoplastic chemotherapy: Secondary | ICD-10-CM | POA: Diagnosis not present

## 2016-05-15 DIAGNOSIS — C50411 Malignant neoplasm of upper-outer quadrant of right female breast: Secondary | ICD-10-CM | POA: Insufficient documentation

## 2016-05-15 DIAGNOSIS — R102 Pelvic and perineal pain: Secondary | ICD-10-CM

## 2016-05-15 DIAGNOSIS — E785 Hyperlipidemia, unspecified: Secondary | ICD-10-CM | POA: Diagnosis not present

## 2016-05-15 DIAGNOSIS — C773 Secondary and unspecified malignant neoplasm of axilla and upper limb lymph nodes: Secondary | ICD-10-CM | POA: Diagnosis not present

## 2016-05-15 DIAGNOSIS — Z8673 Personal history of transient ischemic attack (TIA), and cerebral infarction without residual deficits: Secondary | ICD-10-CM | POA: Insufficient documentation

## 2016-05-15 DIAGNOSIS — C50911 Malignant neoplasm of unspecified site of right female breast: Secondary | ICD-10-CM

## 2016-05-15 DIAGNOSIS — Z5111 Encounter for antineoplastic chemotherapy: Secondary | ICD-10-CM | POA: Diagnosis not present

## 2016-05-15 DIAGNOSIS — F17210 Nicotine dependence, cigarettes, uncomplicated: Secondary | ICD-10-CM | POA: Diagnosis not present

## 2016-05-15 DIAGNOSIS — Z800 Family history of malignant neoplasm of digestive organs: Secondary | ICD-10-CM | POA: Diagnosis not present

## 2016-05-15 DIAGNOSIS — I10 Essential (primary) hypertension: Secondary | ICD-10-CM | POA: Diagnosis not present

## 2016-05-15 DIAGNOSIS — J449 Chronic obstructive pulmonary disease, unspecified: Secondary | ICD-10-CM | POA: Diagnosis not present

## 2016-05-15 LAB — CBC WITH DIFFERENTIAL/PLATELET
BASOS PCT: 0 %
Basophils Absolute: 0 10*3/uL (ref 0.0–0.1)
Eosinophils Absolute: 0 10*3/uL (ref 0.0–0.7)
Eosinophils Relative: 0 %
HCT: 32.7 % — ABNORMAL LOW (ref 36.0–46.0)
Hemoglobin: 11.4 g/dL — ABNORMAL LOW (ref 12.0–15.0)
LYMPHS PCT: 9 %
Lymphs Abs: 1.8 10*3/uL (ref 0.7–4.0)
MCH: 31.8 pg (ref 26.0–34.0)
MCHC: 34.9 g/dL (ref 30.0–36.0)
MCV: 91.3 fL (ref 78.0–100.0)
Monocytes Absolute: 1.4 10*3/uL — ABNORMAL HIGH (ref 0.1–1.0)
Monocytes Relative: 7 %
NEUTROS ABS: 16.6 10*3/uL — AB (ref 1.7–7.7)
NEUTROS PCT: 84 %
Platelets: 168 10*3/uL (ref 150–400)
RBC: 3.58 MIL/uL — ABNORMAL LOW (ref 3.87–5.11)
RDW: 14.6 % (ref 11.5–15.5)
WBC MORPHOLOGY: INCREASED
WBC: 19.8 10*3/uL — ABNORMAL HIGH (ref 4.0–10.5)

## 2016-05-15 LAB — URINALYSIS, ROUTINE W REFLEX MICROSCOPIC
BILIRUBIN URINE: NEGATIVE
Glucose, UA: NEGATIVE mg/dL
Hgb urine dipstick: NEGATIVE
KETONES UR: NEGATIVE mg/dL
Leukocytes, UA: NEGATIVE
NITRITE: NEGATIVE
PH: 5.5 (ref 5.0–8.0)
Protein, ur: NEGATIVE mg/dL
Specific Gravity, Urine: <1.005 — ABNORMAL LOW (ref 1.005–1.030)

## 2016-05-15 LAB — COMPREHENSIVE METABOLIC PANEL
ALT: 12 U/L — AB (ref 14–54)
AST: 16 U/L (ref 15–41)
Albumin: 3.8 g/dL (ref 3.5–5.0)
Alkaline Phosphatase: 105 U/L (ref 38–126)
Anion gap: 4 — ABNORMAL LOW (ref 5–15)
BILIRUBIN TOTAL: 0.4 mg/dL (ref 0.3–1.2)
BUN: 10 mg/dL (ref 6–20)
CALCIUM: 8.7 mg/dL — AB (ref 8.9–10.3)
CO2: 27 mmol/L (ref 22–32)
CREATININE: 0.82 mg/dL (ref 0.44–1.00)
Chloride: 102 mmol/L (ref 101–111)
Glucose, Bld: 114 mg/dL — ABNORMAL HIGH (ref 65–99)
Potassium: 4.3 mmol/L (ref 3.5–5.1)
Sodium: 133 mmol/L — ABNORMAL LOW (ref 135–145)
TOTAL PROTEIN: 6.5 g/dL (ref 6.5–8.1)

## 2016-05-15 MED ORDER — PALONOSETRON HCL INJECTION 0.25 MG/5ML
0.2500 mg | Freq: Once | INTRAVENOUS | Status: AC
  Administered 2016-05-15: 0.25 mg via INTRAVENOUS

## 2016-05-15 MED ORDER — SODIUM CHLORIDE 0.9 % IV SOLN
Freq: Once | INTRAVENOUS | Status: AC
  Administered 2016-05-15: 13:00:00 via INTRAVENOUS
  Filled 2016-05-15: qty 5

## 2016-05-15 MED ORDER — CYCLOPHOSPHAMIDE CHEMO INJECTION 1 GM
600.0000 mg/m2 | Freq: Once | INTRAMUSCULAR | Status: AC
Start: 2016-05-15 — End: 2016-05-15
  Administered 2016-05-15: 1160 mg via INTRAVENOUS
  Filled 2016-05-15: qty 50

## 2016-05-15 MED ORDER — PALONOSETRON HCL INJECTION 0.25 MG/5ML
INTRAVENOUS | Status: AC
  Filled 2016-05-15: qty 5

## 2016-05-15 MED ORDER — SODIUM CHLORIDE 0.9 % IV SOLN
Freq: Once | INTRAVENOUS | Status: AC
  Administered 2016-05-15: 11:00:00 via INTRAVENOUS

## 2016-05-15 MED ORDER — PEGFILGRASTIM 6 MG/0.6ML ~~LOC~~ PSKT
6.0000 mg | PREFILLED_SYRINGE | Freq: Once | SUBCUTANEOUS | Status: AC
  Administered 2016-05-15: 6 mg via SUBCUTANEOUS

## 2016-05-15 MED ORDER — PEGFILGRASTIM 6 MG/0.6ML ~~LOC~~ PSKT
PREFILLED_SYRINGE | SUBCUTANEOUS | Status: AC
  Filled 2016-05-15: qty 0.6

## 2016-05-15 MED ORDER — SODIUM CHLORIDE 0.9% FLUSH: 10.0000 mL | INTRAVENOUS | Status: DC | PRN

## 2016-05-15 MED ORDER — HEPARIN SOD (PORK) LOCK FLUSH 100 UNIT/ML IV SOLN
500.0000 [IU] | Freq: Once | INTRAVENOUS | Status: AC | PRN
  Administered 2016-05-15: 500 [IU]

## 2016-05-15 MED ORDER — DOXORUBICIN HCL CHEMO IV INJECTION 2 MG/ML
60.0000 mg/m2 | Freq: Once | INTRAVENOUS | Status: AC
  Administered 2016-05-15: 116 mg via INTRAVENOUS
  Filled 2016-05-15: qty 58

## 2016-05-15 MED ORDER — HEPARIN SOD (PORK) LOCK FLUSH 100 UNIT/ML IV SOLN
INTRAVENOUS | Status: AC
  Filled 2016-05-15: qty 5

## 2016-05-15 NOTE — Patient Instructions (Addendum)
[place] at [facility] Discharge Instructions  RECOMMENDATIONS MADE BY THE CONSULTANT AND ANY TEST RESULTS WILL BE SENT TO YOUR REFERRING PHYSICIAN.  Urine test today  Treatment today  Return in 2 weeks for follow up and treatment  Thank you for choosing [place] at [facility] to provide your oncology and hematology care.  To afford each patient quality time with our provider, please arrive at least 15 minutes before your scheduled appointment time.    If you have a lab appointment with the [facility] please come in thru the  Main Entrance and check in at the main information desk  You need to re-schedule your appointment should you arrive 10 or more minutes late.  We strive to give you quality time with our providers, and arriving late affects you and other patients whose appointments are after yours.  Also, if you no show three or more times for appointments you may be dismissed from the clinic at the providers discretion.     Again, thank you for choosing [facility].  Our hope is that these requests will decrease the amount of time that you wait before being seen by our physicians.       _____________________________________________________________  Should you have questions after your visit to [facility], please contact our office at [phone] between the hours of 8:30 a.m. and 4:30 p.m.  Voicemails left after 4:30 p.m. will not be returned until the following business day.  For prescription refill requests, have your pharmacy contact our office.       Resources For Cancer Patients and their Caregivers ? American Cancer Society: Can assist with transportation, wigs, general needs, runs Look Good Feel Better.        (939)468-0546 ? Cancer Care: Provides financial assistance, online support groups, medication/co-pay assistance.  1-800-813-HOPE (339)423-0786) ? [facility] Assists [place] cancer patients and their families through emotional , educational and financial support.  919-166-0267 ? [place] DSS Where to apply for food stamps, Medicaid and utility assistance. 7854893424 ? [employer]: Transportation to medical appointments. 365-299-8017 ? Social Security Administration: May apply for disability if have a Stage IV cancer. (971)424-0296 561-365-8457 ? [employer], Disability and Transit Services: Assists with nutrition, care and transit needs. [place] Support Programs: @10RELATIVEDAYS @ > Cancer Support Group  2nd Tuesday of the month 1pm-2pm, Journey Room  > Creative Journey  3rd Tuesday of the month 1130am-1pm, Journey Room  > Look Good Feel Better  1st Wednesday of the month 10am-12 noon, Journey Room (Call [place] to register [phone])

## 2016-05-15 NOTE — Patient Instructions (Signed)
[facility] Discharge Instructions for Patients Receiving Chemotherapy   Beginning January 23rd 2017 lab work for the Cancer Center will be done in the  Main lab at [place] on 1st floor. If you have a lab appointment with the Cancer Center please come in thru the  Main Entrance and check in at the main information desk   Today you received the following chemotherapy agents:  Adriamycin and Cytoxan  If you develop nausea and vomiting, or diarrhea that is not controlled by your medication, call the clinic.  The clinic phone number is [phone]. Office hours are Monday-Friday 8:30am-5:00pm.  BELOW ARE SYMPTOMS THAT SHOULD BE REPORTED IMMEDIATELY:  *FEVER GREATER THAN 101.0 F  *CHILLS WITH OR WITHOUT FEVER  NAUSEA AND VOMITING THAT IS NOT CONTROLLED WITH YOUR NAUSEA MEDICATION  *UNUSUAL SHORTNESS OF BREATH  *UNUSUAL BRUISING OR BLEEDING  TENDERNESS IN MOUTH AND THROAT WITH OR WITHOUT PRESENCE OF ULCERS  *URINARY PROBLEMS  *BOWEL PROBLEMS  UNUSUAL RASH Items with * indicate a potential emergency and should be followed up as soon as possible. If you have an emergency after office hours please contact your primary care physician or go to the nearest emergency department.  Please call the clinic during office hours if you have any questions or concerns.   You may also contact the Patient Navigator at [phone] should you have any questions or need assistance in obtaining follow up care.      Resources For Cancer Patients and their Caregivers ? American Cancer Society: Can assist with transportation, wigs, general needs, runs Look Good Feel Better.        1-888-227-6333 ? Cancer Care: Provides financial assistance, online support groups, medication/co-pay assistance.  1-800-813-HOPE (4673) ? [facility] Assists [place] cancer patients and their families through emotional , educational and financial support.   336-427-4357 ? [place] DSS Where to apply for food stamps, Medicaid and utility assistance. 336-342-1394 ? RCATS: Transportation to medical appointments. 336-347-2287 ? Social Security Administration: May apply for disability if have a Stage IV cancer. 336-342-7796 1-800-772-1213 ? [place] Aging, Disability and Transit Services: Assists with nutrition, care and transit needs. 336-349-2343         

## 2016-05-15 NOTE — Assessment & Plan Note (Addendum)
Invasive ductal carcinoma of right breast ER/PR +, HER2 NEGATIVE, with right axillary lymph node positive for disease.  Oncology history updated.  Staging in [facility] problem list completed with clinical data.  Pre-treatment labs today: CBC diff, CMET.  I personally reviewed and went over laboratory results with the patient.  The results are noted within this dictation. Labs satisfy treatment parameters today.  Order placed for UA with reflex and urine culture due to "urinary pressure."  I broached the second part of her treatment plan with Paclitaxel.  She is advised that we will review the side effects of that aspect of her treatment plan at return visit in order to avoid any confusion or information overload.  Patient will return in 2 weeks for follow-up.

## 2016-05-15 NOTE — Progress Notes (Signed)
Patient tolerated infusion well.  VSS.  Applied ONPRO neulasta on body injector. See MAR for administration details. Injector in place and engaged with green light indicator on flashing. Tolerated application with out problems.  Patient ambulatory and stable upon discharge from the clinic.

## 2016-05-15 NOTE — Progress Notes (Signed)
Ashley Neighbors, MD [place] [address]  Invasive ductal carcinoma of breast, female, right [facility])  Pelvic pressure in female - Plan: Urinalysis, Routine w reflex microscopic, Urine culture  CURRENT THERAPY: AC every 2 weeks beginning on 04/16/2016 with Neulasta support x 4 cycles followed by Paclitaxel weekly x 12.  INTERVAL HISTORY: Ashley Doyle 71 y.o. female returns for followup of Invasive ductal carcinoma of right breast ER/PR +, HER2 NEGATIVE, with right axillary lymph node positive for disease.    Invasive ductal carcinoma of breast, female, right ([place])   02/21/2016 Mammogram    Irregular vascular mass in the right breast at 10 o'clock, 10 cm the nipple, measuring 2.9 x 2.5 x 2.6 cm. In the right axilla there is 3 discrete enlarged abnormal appearing lymph nodes. Largest, which lies lateral to the palpable mass, measures 1.9 x 1.3 x 1.9 cm.      02/28/2016 Procedure    Right needle core biopsy in 10:00 position and right axillary biopsy      02/28/2016 Procedure    Postprocedure mammogram for clip placement. Ribbon shaped clip well-positioned within the biopsied mass in the right breast at the 10 o'clock axis. Wing shaped clip is well-positioned within the biopsied lymph node as seen on postprocedure ultrasound evaluation.  Final Assessment: Post Procedure Mammograms for Marker Placement      03/01/2016 Pathology Results    Invasive ductal carcinoma with extracellular mucin of right breast mass and ductal carcinoma of right axillary lymph node.  Grade 2.  ER 100%, PR 100%, Ki-67 12%, HER2 NEGATIVE.      03/12/2016 Imaging    CT CAP- Right breast cancer with associated right axillary metastatic lymph nodes. 2. No evidence of distant metastatic disease. 3. Hepatic steatosis.      03/26/2016 Breast MRI    2.7 cm spiculated mass in the upper-outer quadrant of right breast corresponding with the invasive ductal carcinoma. Masses in  the right axilla consistent with axillary adenopathy.      04/16/2016 -  Chemotherapy    The patient had DOXOrubicin (ADRIAMYCIN) chemo injection 116 mg, 60 mg/m2 = 116 mg, Intravenous,  Once, 2 of 4 cycles  palonosetron (ALOXI) injection 0.25 mg, 0.25 mg, Intravenous,  Once, 2 of 4 cycles  pegfilgrastim (NEULASTA ONPRO KIT) injection 6 mg, 6 mg, Subcutaneous, Once, 2 of 4 cycles  cyclophosphamide (CYTOXAN) 1,160 mg in sodium chloride 0.9 % 250 mL chemo infusion, 600 mg/m2 = 1,160 mg, Intravenous,  Once, 2 of 4 cycles  PACLitaxel (TAXOL) 156 mg in dextrose 5 % 250 mL chemo infusion ( fosaprepitant (EMEND) 150 mg, dexamethasone (DECADRON) 12 mg in sodium chloride 0.9 % 145 mL IVPB, , Intravenous,  Once, 2 of 4 cycles  for chemotherapy treatment.         She is tolerating treatment well, as expected.  She notes some urinary pressure.  She denies any pain, urinary pain.  She denies any hematuria, or malodorous urine.  Her appetite is stable.  Weight is down 4 lbs.  Review of Systems  Constitutional: Positive for weight loss. Negative for chills and fever.  HENT: Negative.   Eyes: Negative.   Respiratory: Negative.  Negative for cough.   Cardiovascular: Negative.  Negative for chest pain.  Gastrointestinal: Negative.  Negative for constipation, diarrhea, nausea and vomiting.  Genitourinary:       "Urinary pressure"  Musculoskeletal: Negative.   Skin: Negative.   Neurological: Negative.  Negative  for weakness.  Endo/Heme/Allergies: Negative.   Psychiatric/Behavioral: Negative.     Past Medical History:  Diagnosis Date  . Anxiety   . Arthritis   . Asthma   . Breast cancer ([place])   . COPD (chronic obstructive pulmonary disease) ([place])   . Depression   . Emphysema of lung ([place])   . Family history of adverse reaction to anesthesia    sister has PONV  . GERD (gastroesophageal reflux disease)   . HOH (hard of hearing)   . Hypertension   . Invasive ductal carcinoma of  breast, female, right ([place]) 03/07/2016  . Pinched nerve in neck    and back  . Stroke ([place])   . Tuberculosis    "several years ago" 04/11/16  . Wears glasses   . Wears hearing aid    left ear    Past Surgical History:  Procedure Laterality Date  . ABDOMINAL HYSTERECTOMY    . APPENDECTOMY    . INNER EAR SURGERY Right   . PORTACATH PLACEMENT N/A 04/12/2016   Procedure: INSERTION PORT-A-CATH WITH [place];  Surgeon: Rolm Bookbinder, MD;  Location: [facility] OR;  Service: General;  Laterality: N/A;    Family History  Problem Relation Age of Onset  . Stroke Mother   . Heart disease Mother   . Lung cancer Father     Social History   Social History  . Marital status: Married    Spouse name: N/A  . Number of children: N/A  . Years of education: N/A   Social History Main Topics  . Smoking status: Current Every Day Smoker    Packs/day: 2.00    Years: 54.00    Types: Cigarettes  . Smokeless tobacco: Never Used  . Alcohol use No  . Drug use: No  . Sexual activity: Not on file     Comment: hysterectomy   Other Topics Concern  . Not on file   Social History Narrative  . No narrative on file     PHYSICAL EXAMINATION  ECOG PERFORMANCE STATUS: 1 - Symptomatic but completely ambulatory  Vitals:   05/15/16 0937  BP: 139/64  Pulse: 78  Resp: 20  Temp: 97.8 F (36.6 C)    GENERAL:alert, no distress, comfortable, cooperative, obese, smiling and accompanied by husband SKIN: skin color, texture, turgor are normal, no rashes or significant lesions HEAD: Normocephalic, No masses, lesions, tenderness or abnormalities EYES: normal, EOMI, Conjunctiva are pink and non-injected EARS: External ears normal OROPHARYNX:lips, buccal mucosa, and tongue normal and mucous membranes are moist  NECK: supple, trachea midline LYMPH:  not examined BREAST:not examined LUNGS: clear to auscultation  HEART: regular rate & rhythm ABDOMEN:abdomen soft and normal bowel sounds BACK: Back symmetric, no  curvature. EXTREMITIES:less then 2 second capillary refill, no joint deformities, effusion, or inflammation, no skin discoloration, no cyanosis  NEURO: alert & oriented x 3 with fluent speech, no focal motor/sensory deficits, gait normal   LABORATORY DATA: CBC    Component Value Date/Time   WBC 19.8 (H) 05/15/2016 0951   RBC 3.58 (L) 05/15/2016 0951   HGB 11.4 (L) 05/15/2016 0951   HCT 32.7 (L) 05/15/2016 0951   PLT 168 05/15/2016 0951   MCV 91.3 05/15/2016 0951   MCH 31.8 05/15/2016 0951   MCHC 34.9 05/15/2016 0951   RDW 14.6 05/15/2016 0951   LYMPHSABS 1.8 05/15/2016 0951   MONOABS 1.4 (H) 05/15/2016 0951   EOSABS 0.0 05/15/2016 0951   BASOSABS 0.0 05/15/2016 0951      Chemistry  Component Value Date/Time   NA 133 (L) 05/15/2016 0951   K 4.3 05/15/2016 0951   CL 102 05/15/2016 0951   CO2 27 05/15/2016 0951   BUN 10 05/15/2016 0951   CREATININE 0.82 05/15/2016 0951      Component Value Date/Time   CALCIUM 8.7 (L) 05/15/2016 0951   ALKPHOS 105 05/15/2016 0951   AST 16 05/15/2016 0951   ALT 12 (L) 05/15/2016 0951   BILITOT 0.4 05/15/2016 0951        PENDING LABS:   RADIOGRAPHIC STUDIES:  No results found.   PATHOLOGY:    ASSESSMENT AND PLAN:  Invasive ductal carcinoma of breast, female, right ([place]) Invasive ductal carcinoma of right breast ER/PR +, HER2 NEGATIVE, with right axillary lymph node positive for disease.  Oncology history updated.  Staging in [facility] problem list completed with clinical data.  Pre-treatment labs today: CBC diff, CMET.  I personally reviewed and went over laboratory results with the patient.  The results are noted within this dictation. Labs satisfy treatment parameters today.  Order placed for UA with reflex and urine culture due to "urinary pressure."  I broached the second part of her treatment plan with Paclitaxel.  She is advised that we will review the side effects of that aspect of her treatment plan at return visit  in order to avoid any confusion or information overload.  Patient will return in 2 weeks for follow-up.    ORDERS PLACED FOR THIS ENCOUNTER: Orders Placed This Encounter  Procedures  . Urine culture  . Urinalysis, Routine w reflex microscopic    MEDICATIONS PRESCRIBED THIS ENCOUNTER: No orders of the defined types were placed in this encounter.   THERAPY PLAN:  AC x 4 cycles followed by Paclitaxel.  All questions were answered. The patient knows to call the clinic with any problems, questions or concerns. We can certainly see the patient much sooner if necessary.  Patient and plan discussed with Dr. Ancil Linsey and she is in agreement with the aforementioned.   This note is electronically signed by: Doy Mince 05/15/2016 2:04 PM

## 2016-05-17 LAB — URINE CULTURE

## 2016-05-21 ENCOUNTER — Other Ambulatory Visit (HOSPITAL_COMMUNITY): Payer: Self-pay | Admitting: Emergency Medicine

## 2016-05-21 ENCOUNTER — Telehealth (HOSPITAL_COMMUNITY): Payer: Self-pay | Admitting: Emergency Medicine

## 2016-05-21 MED ORDER — PROMETHAZINE HCL 25 MG PO TABS: 12.5000 mg | ORAL_TABLET | Freq: Four times a day (QID) | ORAL | 1 refills | Status: DC | PRN

## 2016-05-21 MED ORDER — OXYCODONE-ACETAMINOPHEN 10-325 MG PO TABS: 1.0000 | ORAL_TABLET | ORAL | 0 refills | Status: DC | PRN

## 2016-05-21 NOTE — Telephone Encounter (Signed)
Pt called and is still having lots of pain and nausea.  She needs a pain medication refill.  She just had her last refill a week ago.  She is taking a pill every 4 hours.  She is taking her nausea medications but not helping much.  Spoke with Dr Whitney Muse.  Refilled the oxycodone and added phenergan.  She can take 1/2 tablet 12.5 mg at first bc it will make her sleepy along with zofran if she needs it.  Verbalized understanding.

## 2016-05-27 NOTE — Assessment & Plan Note (Addendum)
Invasive ductal carcinoma of right breast ER/PR +, HER2 NEGATIVE, with right axillary lymph node positive for disease. Currently undergoing neoadjuvant chemotherapy consisting of AC followed by T beginning on 12/4/107.  Oncology history updated.  Pre-treatment labs today: CBC diff, CMET.  I personally reviewed and went over laboratory results with the patient.  The results are noted within this dictation. Mild thrombocytopenia noted today and therefore, treatment is deferred x 1 week.  Treatment plan manipulated accordingly.  For her cough, she is given a dose of Delsym in the clinic, 30 mg.  She is provided an Rx for Hycodan liquid.  If not affordable, she will call.  I suspect her cough is a main cause of her dry heaving, according to provided history.  Order is placed for chest xray due to cough.  For her back pain, an order is placed for xray of thoracic spine.  Given history, additional lab is placed for lipase.  If results are negative, then I will try a PPI or Carafate for this discomfort.  This may be caused from her cough and heaving as well.  For her perineal lesion, I have recommended warm compresses, sitz baths, and DIAL soap to the area.  If opens, then she may use an antibiotic ointment.  If worsens, then antibiotic can be considered.  Patient will return in 1 weeks for cycle #4 (due to thrombocytopenia today) and 3 weeks for follow-up, breast exam, and review of side effects of Paclitaxel.

## 2016-05-27 NOTE — Progress Notes (Signed)
Ashley Neighbors, MD [place] [address]  Invasive ductal carcinoma of breast, female, right ([place])  Cough - Plan: HYDROcodone-homatropine (HYCODAN) 5-1.5 MG/5ML syrup, DG Chest 1 View, dextromethorphan (DELSYM) 30 MG/5ML liquid 30 mg  Acute midline thoracic back pain - Plan: DG Thoracic Spine 4V, Lipase, Lipase  Bandemia - Plan: Urine culture, Urinalysis, Routine w reflex microscopic  CURRENT THERAPY: AC every 2 weeks beginning on 04/16/2016 with Neulasta support x 4 cycles followed by Paclitaxel weekly x 12.  INTERVAL HISTORY: Ashley Doyle 71 y.o. female returns for followup of Invasive ductal carcinoma of right breast ER/PR +, HER2 NEGATIVE, with right axillary lymph node positive for disease.    Invasive ductal carcinoma of breast, female, right ([place])   02/21/2016 Mammogram    Irregular vascular mass in the right breast at 10 o'clock, 10 cm the nipple, measuring 2.9 x 2.5 x 2.6 cm. In the right axilla there is 3 discrete enlarged abnormal appearing lymph nodes. Largest, which lies lateral to the palpable mass, measures 1.9 x 1.3 x 1.9 cm.      02/28/2016 Procedure    Right needle core biopsy in 10:00 position and right axillary biopsy      02/28/2016 Procedure    Postprocedure mammogram for clip placement. Ribbon shaped clip well-positioned within the biopsied mass in the right breast at the 10 o'clock axis. Wing shaped clip is well-positioned within the biopsied lymph node as seen on postprocedure ultrasound evaluation.  Final Assessment: Post Procedure Mammograms for Marker Placement      03/01/2016 Pathology Results    Invasive ductal carcinoma with extracellular mucin of right breast mass and ductal carcinoma of right axillary lymph node.  Grade 2.  ER 100%, PR 100%, Ki-67 12%, HER2 NEGATIVE.      03/12/2016 Imaging    CT CAP- Right breast cancer with associated right axillary metastatic lymph nodes. 2. No evidence of distant  metastatic disease. 3. Hepatic steatosis.      03/26/2016 Breast MRI    2.7 cm spiculated mass in the upper-outer quadrant of right breast corresponding with the invasive ductal carcinoma. Masses in the right axilla consistent with axillary adenopathy.      04/16/2016 -  Chemotherapy    The patient had DOXOrubicin (ADRIAMYCIN) chemo injection 116 mg, 60 mg/m2 = 116 mg, Intravenous,  Once, 3 of 4 cycles  palonosetron (ALOXI) injection 0.25 mg, 0.25 mg, Intravenous,  Once, 3 of 4 cycles  pegfilgrastim (NEULASTA ONPRO KIT) injection 6 mg, 6 mg, Subcutaneous, Once, 3 of 4 cycles  cyclophosphamide (CYTOXAN) 1,160 mg in sodium chloride 0.9 % 250 mL chemo infusion, 600 mg/m2 = 1,160 mg, Intravenous,  Once, 3 of 4 cycles  PACLitaxel (TAXOL) 156 mg in dextrose 5 % 250 mL chemo infusion ( fosaprepitant (EMEND) 150 mg, dexamethasone (DECADRON) 12 mg in sodium chloride 0.9 % 145 mL IVPB, , Intravenous,  Once, 3 of 4 cycles  for chemotherapy treatment.        05/28/2016 Adverse Reaction    Thrombocytopenia      05/28/2016 Treatment Plan Change    AC #4 delayed x 1 week       She reports not feeling well with her last cycle of treatment.  2 days post-treatment, she started to develop right shoulder blade pain that radiates to her thoracic spine.  She denies any trauma, falls, or rash.  She notes that it is a 10/10 when present.  It comes and  goes, but lasts 1 hour at a time.  She describes it as sharp and aching.  Also, 2 days post-treatment, she started to develop a cough that resulted in dry heaving.  She notes vomiting mucous, but no food products or bile.  She reports that phenergan has been helpful for the nausea, but not the cough.  She denies any fevers or chills.  She denies any sputum production.  Her husband is currently ill with the flu.  She notes two tender sores near her rectum.  These started on Saturday.  He denies any drainage.  Review of Systems  Constitutional: Negative.   Negative for chills, fever and weight loss.  HENT: Negative.   Eyes: Negative.   Respiratory: Positive for cough. Negative for hemoptysis, sputum production and shortness of breath.   Cardiovascular: Negative.  Negative for chest pain.  Gastrointestinal: Positive for constipation, nausea and vomiting. Negative for diarrhea.  Genitourinary: Negative.  Negative for dysuria.  Musculoskeletal: Positive for back pain. Negative for falls.  Skin: Negative.  Negative for rash.  Neurological: Negative.   Endo/Heme/Allergies: Negative.   Psychiatric/Behavioral: Negative.     Past Medical History:  Diagnosis Date  . Anxiety   . Arthritis   . Asthma   . Breast cancer ([place])   . COPD (chronic obstructive pulmonary disease) ([place])   . Depression   . Emphysema of lung ([place])   . Family history of adverse reaction to anesthesia    sister has PONV  . GERD (gastroesophageal reflux disease)   . HOH (hard of hearing)   . Hypertension   . Invasive ductal carcinoma of breast, female, right ([place]) 03/07/2016  . Pinched nerve in neck    and back  . Stroke ([place])   . Tuberculosis    "several years ago" 04/11/16  . Wears glasses   . Wears hearing aid    left ear    Past Surgical History:  Procedure Laterality Date  . ABDOMINAL HYSTERECTOMY    . APPENDECTOMY    . INNER EAR SURGERY Right   . PORTACATH PLACEMENT N/A 04/12/2016   Procedure: INSERTION PORT-A-CATH WITH [place];  Surgeon: Rolm Bookbinder, MD;  Location: [facility] OR;  Service: General;  Laterality: N/A;    Family History  Problem Relation Age of Onset  . Stroke Mother   . Heart disease Mother   . Lung cancer Father     Social History   Social History  . Marital status: Married    Spouse name: N/A  . Number of children: N/A  . Years of education: N/A   Social History Main Topics  . Smoking status: Current Every Day Smoker    Packs/day: 2.00    Years: 54.00    Types: Cigarettes  . Smokeless tobacco: Never Used  . Alcohol use No    . Drug use: No  . Sexual activity: Not Asked     Comment: hysterectomy   Other Topics Concern  . None   Social History Narrative  . None     PHYSICAL EXAMINATION  ECOG PERFORMANCE STATUS: 1 - Symptomatic but completely ambulatory  Vitals:   05/28/16 0950  BP: (!) 152/65  Pulse: 90  Resp: 16  Temp: 97.6 F (36.4 C)    GENERAL:alert, no distress, comfortable, cooperative, obese, smiling and unaccompanied.  No cough witnessed SKIN: skin color, texture, turgor are normal, no rashes. HEAD: Normocephalic, No masses, lesions, tenderness or abnormalities EYES: normal, EOMI, Conjunctiva are pink and non-injected.  Exotropia of left eye EARS:  External ears normal OROPHARYNX:lips, buccal mucosa, and tongue normal and mucous membranes are moist  NECK: supple, trachea midline LYMPH:  not examined BREAST:not examined LUNGS: clear to auscultation without wheezes, rales, or rhonchi. HEART: regular rate & rhythm without murmur, rub, or gallop ABDOMEN:abdomen soft and normal bowel sounds BACK: Back symmetric, no curvature. EXTREMITIES:less then 2 second capillary refill, no joint deformities, effusion, or inflammation, no skin discoloration, no cyanosis  PERINEUM: Small, 1 cm, palpable lesion with erythema that is tender to palpation without expression of drainage. NEURO: alert & oriented x 3 with fluent speech, no focal motor/sensory deficits, gait normal   LABORATORY DATA: CBC    Component Value Date/Time   WBC 25.6 (H) 05/28/2016 1013   RBC 3.15 (L) 05/28/2016 1013   HGB 10.0 (L) 05/28/2016 1013   HCT 28.4 (L) 05/28/2016 1013   PLT 87 (L) 05/28/2016 1013   MCV 90.2 05/28/2016 1013   MCH 31.7 05/28/2016 1013   MCHC 35.2 05/28/2016 1013   RDW 16.2 (H) 05/28/2016 1013   LYMPHSABS 1.8 05/28/2016 1013   MONOABS 1.8 (H) 05/28/2016 1013   EOSABS 0.0 05/28/2016 1013   BASOSABS 0.3 (H) 05/28/2016 1013      Chemistry      Component Value Date/Time   NA 132 (L) 05/28/2016 1013    K 4.0 05/28/2016 1013   CL 101 05/28/2016 1013   CO2 24 05/28/2016 1013   BUN 8 05/28/2016 1013   CREATININE 0.98 05/28/2016 1013      Component Value Date/Time   CALCIUM 8.8 (L) 05/28/2016 1013   ALKPHOS 107 05/28/2016 1013   AST 19 05/28/2016 1013   ALT 12 (L) 05/28/2016 1013   BILITOT 0.6 05/28/2016 1013        PENDING LABS:   RADIOGRAPHIC STUDIES:  No results found.   PATHOLOGY:    ASSESSMENT AND PLAN:  Invasive ductal carcinoma of breast, female, right ([place]) Invasive ductal carcinoma of right breast ER/PR +, HER2 NEGATIVE, with right axillary lymph node positive for disease. Currently undergoing neoadjuvant chemotherapy consisting of AC followed by T beginning on 12/4/107.  Oncology history updated.  Pre-treatment labs today: CBC diff, CMET.  I personally reviewed and went over laboratory results with the patient.  The results are noted within this dictation. Mild thrombocytopenia noted today and therefore, treatment is deferred x 1 week.  Treatment plan manipulated accordingly.  For her cough, she is given a dose of Delsym in the clinic, 30 mg.  She is provided an Rx for Hycodan liquid.  If not affordable, she will call.  I suspect her cough is a main cause of her dry heaving, according to provided history.  Order is placed for chest xray due to cough.  For her back pain, an order is placed for xray of thoracic spine.  Given history, additional lab is placed for lipase.  If results are negative, then I will try a PPI or Carafate for this discomfort.  This may be caused from her cough and heaving as well.  For her perineal lesion, I have recommended warm compresses, sitz baths, and DIAL soap to the area.  If opens, then she may use an antibiotic ointment.  If worsens, then antibiotic can be considered.  Patient will return in 1 weeks for cycle #4 (due to thrombocytopenia today) and 3 weeks for follow-up, breast exam, and review of side effects of  Paclitaxel.   ORDERS PLACED FOR THIS ENCOUNTER: Orders Placed This Encounter  Procedures  . Urine culture  .  DG Thoracic Spine 4V  . DG Chest 1 View  . Lipase  . Urinalysis, Routine w reflex microscopic    MEDICATIONS PRESCRIBED THIS ENCOUNTER: Meds ordered this encounter  Medications  . HYDROcodone-homatropine (HYCODAN) 5-1.5 MG/5ML syrup    Sig: Take 5 mLs by mouth every 6 (six) hours as needed for cough.    Dispense:  120 mL    Refill:  0    Order Specific Question:   Supervising Provider    Answer:   Patrici Ranks [id number]  . dextromethorphan (DELSYM) 30 MG/5ML liquid 30 mg    THERAPY PLAN:  AC x 4 cycles followed by Paclitaxel.  All questions were answered. The patient knows to call the clinic with any problems, questions or concerns. We can certainly see the patient much sooner if necessary.  Patient and plan discussed with Dr. Ancil Linsey and she is in agreement with the aforementioned.   This note is electronically signed by: Doy Mince 05/28/2016 12:24 PM

## 2016-05-28 ENCOUNTER — Encounter (HOSPITAL_BASED_OUTPATIENT_CLINIC_OR_DEPARTMENT_OTHER): Payer: Medicare HMO | Admitting: Oncology

## 2016-05-28 ENCOUNTER — Other Ambulatory Visit (HOSPITAL_COMMUNITY): Payer: Self-pay | Admitting: Oncology

## 2016-05-28 ENCOUNTER — Encounter (HOSPITAL_COMMUNITY): Payer: Medicare HMO

## 2016-05-28 ENCOUNTER — Encounter (HOSPITAL_COMMUNITY): Payer: Self-pay | Admitting: Oncology

## 2016-05-28 ENCOUNTER — Ambulatory Visit (HOSPITAL_COMMUNITY)
Admission: RE | Admit: 2016-05-28 | Discharge: 2016-05-28 | Disposition: A | Discharge status: Home / Self Care | Payer: Medicare HMO | Source: Ambulatory Visit | Attending: Oncology | Admitting: Oncology

## 2016-05-28 VITALS — BP 152/65 | HR 90 | Temp 97.6°F | Resp 16 | Wt 173.8 lb

## 2016-05-28 DIAGNOSIS — C773 Secondary and unspecified malignant neoplasm of axilla and upper limb lymph nodes: Secondary | ICD-10-CM | POA: Diagnosis not present

## 2016-05-28 DIAGNOSIS — Z9889 Other specified postprocedural states: Secondary | ICD-10-CM | POA: Diagnosis not present

## 2016-05-28 DIAGNOSIS — Z170 Estrogen receptor positive status [ER+]: Secondary | ICD-10-CM

## 2016-05-28 DIAGNOSIS — R05 Cough: Secondary | ICD-10-CM

## 2016-05-28 DIAGNOSIS — D696 Thrombocytopenia, unspecified: Secondary | ICD-10-CM

## 2016-05-28 DIAGNOSIS — C50911 Malignant neoplasm of unspecified site of right female breast: Secondary | ICD-10-CM

## 2016-05-28 DIAGNOSIS — Z800 Family history of malignant neoplasm of digestive organs: Secondary | ICD-10-CM | POA: Diagnosis not present

## 2016-05-28 DIAGNOSIS — M546 Pain in thoracic spine: Secondary | ICD-10-CM

## 2016-05-28 DIAGNOSIS — D72825 Bandemia: Secondary | ICD-10-CM | POA: Diagnosis not present

## 2016-05-28 DIAGNOSIS — E785 Hyperlipidemia, unspecified: Secondary | ICD-10-CM | POA: Diagnosis not present

## 2016-05-28 DIAGNOSIS — M47894 Other spondylosis, thoracic region: Secondary | ICD-10-CM | POA: Diagnosis not present

## 2016-05-28 DIAGNOSIS — Z9221 Personal history of antineoplastic chemotherapy: Secondary | ICD-10-CM | POA: Diagnosis not present

## 2016-05-28 DIAGNOSIS — R1013 Epigastric pain: Secondary | ICD-10-CM

## 2016-05-28 DIAGNOSIS — M4184 Other forms of scoliosis, thoracic region: Secondary | ICD-10-CM | POA: Diagnosis not present

## 2016-05-28 DIAGNOSIS — N9089 Other specified noninflammatory disorders of vulva and perineum: Secondary | ICD-10-CM | POA: Diagnosis not present

## 2016-05-28 DIAGNOSIS — C50411 Malignant neoplasm of upper-outer quadrant of right female breast: Secondary | ICD-10-CM | POA: Diagnosis not present

## 2016-05-28 DIAGNOSIS — I2510 Atherosclerotic heart disease of native coronary artery without angina pectoris: Secondary | ICD-10-CM | POA: Diagnosis not present

## 2016-05-28 DIAGNOSIS — R059 Cough, unspecified: Secondary | ICD-10-CM

## 2016-05-28 DIAGNOSIS — Z8673 Personal history of transient ischemic attack (TIA), and cerebral infarction without residual deficits: Secondary | ICD-10-CM | POA: Diagnosis not present

## 2016-05-28 DIAGNOSIS — M47814 Spondylosis without myelopathy or radiculopathy, thoracic region: Secondary | ICD-10-CM | POA: Diagnosis not present

## 2016-05-28 DIAGNOSIS — Z8052 Family history of malignant neoplasm of bladder: Secondary | ICD-10-CM | POA: Diagnosis not present

## 2016-05-28 DIAGNOSIS — J449 Chronic obstructive pulmonary disease, unspecified: Secondary | ICD-10-CM | POA: Diagnosis not present

## 2016-05-28 DIAGNOSIS — I10 Essential (primary) hypertension: Secondary | ICD-10-CM | POA: Diagnosis not present

## 2016-05-28 LAB — CBC WITH DIFFERENTIAL/PLATELET
BASOS PCT: 1 %
Basophils Absolute: 0.3 10*3/uL — ABNORMAL HIGH (ref 0.0–0.1)
EOS PCT: 0 %
Eosinophils Absolute: 0 10*3/uL (ref 0.0–0.7)
HEMATOCRIT: 28.4 % — AB (ref 36.0–46.0)
HEMOGLOBIN: 10 g/dL — AB (ref 12.0–15.0)
LYMPHS ABS: 1.8 10*3/uL (ref 0.7–4.0)
LYMPHS PCT: 7 %
MCH: 31.7 pg (ref 26.0–34.0)
MCHC: 35.2 g/dL (ref 30.0–36.0)
MCV: 90.2 fL (ref 78.0–100.0)
MONOS PCT: 7 %
Monocytes Absolute: 1.8 10*3/uL — ABNORMAL HIGH (ref 0.1–1.0)
NEUTROS ABS: 21.7 10*3/uL — AB (ref 1.7–7.7)
Neutrophils Relative %: 85 %
Platelets: 87 10*3/uL — ABNORMAL LOW (ref 150–400)
RBC: 3.15 MIL/uL — ABNORMAL LOW (ref 3.87–5.11)
RDW: 16.2 % — ABNORMAL HIGH (ref 11.5–15.5)
WBC: 25.6 10*3/uL — ABNORMAL HIGH (ref 4.0–10.5)

## 2016-05-28 LAB — COMPREHENSIVE METABOLIC PANEL
ALBUMIN: 3.8 g/dL (ref 3.5–5.0)
ALK PHOS: 107 U/L (ref 38–126)
ALT: 12 U/L — AB (ref 14–54)
ANION GAP: 7 (ref 5–15)
AST: 19 U/L (ref 15–41)
BILIRUBIN TOTAL: 0.6 mg/dL (ref 0.3–1.2)
BUN: 8 mg/dL (ref 6–20)
CALCIUM: 8.8 mg/dL — AB (ref 8.9–10.3)
CO2: 24 mmol/L (ref 22–32)
CREATININE: 0.98 mg/dL (ref 0.44–1.00)
Chloride: 101 mmol/L (ref 101–111)
GFR calc non Af Amer: 57 mL/min — ABNORMAL LOW (ref >60)
Glucose, Bld: 111 mg/dL — ABNORMAL HIGH (ref 65–99)
Potassium: 4 mmol/L (ref 3.5–5.1)
SODIUM: 132 mmol/L — AB (ref 135–145)
TOTAL PROTEIN: 6 g/dL — AB (ref 6.5–8.1)

## 2016-05-28 LAB — URINALYSIS, ROUTINE W REFLEX MICROSCOPIC
BILIRUBIN URINE: NEGATIVE
GLUCOSE, UA: NEGATIVE mg/dL
Hgb urine dipstick: NEGATIVE
KETONES UR: NEGATIVE mg/dL
Leukocytes, UA: NEGATIVE
NITRITE: NEGATIVE
PROTEIN: NEGATIVE mg/dL
Specific Gravity, Urine: 1.003 — ABNORMAL LOW (ref 1.005–1.030)
pH: 5 (ref 5.0–8.0)

## 2016-05-28 LAB — LIPASE, BLOOD: Lipase: 20 U/L (ref 11–51)

## 2016-05-28 MED ORDER — DEXTROMETHORPHAN POLISTIREX ER 30 MG/5ML PO SUER
30.0000 mg | Freq: Once | ORAL | Status: AC
  Administered 2016-05-28: 30 mg via ORAL
  Filled 2016-05-28: qty 5

## 2016-05-28 MED ORDER — HYDROCODONE-HOMATROPINE 5-1.5 MG/5ML PO SYRP: 5.0000 mL | ORAL_SOLUTION | Freq: Four times a day (QID) | ORAL | 0 refills | Status: DC | PRN

## 2016-05-28 MED ORDER — HEPARIN SOD (PORK) LOCK FLUSH 100 UNIT/ML IV SOLN
INTRAVENOUS | Status: AC
  Filled 2016-05-28: qty 5

## 2016-05-28 MED ORDER — OMEPRAZOLE 20 MG PO CPDR: 20.0000 mg | DELAYED_RELEASE_CAPSULE | Freq: Every day | ORAL | 5 refills | Status: DC

## 2016-05-28 NOTE — Patient Instructions (Signed)
[place] at [facility] Discharge Instructions  RECOMMENDATIONS MADE BY THE CONSULTANT AND ANY TEST RESULTS WILL BE SENT TO YOUR REFERRING PHYSICIAN.  You were seen today by Kirby Crigler, PA-C We will get a chest xray and one of your back today We have given you a prescription for hycodan cough syrup If it is too expensive call us and we can call you in something cheaper For the boil in your "private area"  - warm compresses, sit on it  - SITZ bath  - use dial soap in that area and keep it clean  - in a few days if it comes to a head and starts to ooze, put neosporin   ointment around it to help it heal  Thank you for choosing [place] at [facility] to provide your oncology and hematology care.  To afford each patient quality time with our provider, please arrive at least 15 minutes before your scheduled appointment time.    If you have a lab appointment with the [facility] please come in thru the  Main Entrance and check in at the main information desk  You need to re-schedule your appointment should you arrive 10 or more minutes late.  We strive to give you quality time with our providers, and arriving late affects you and other patients whose appointments are after yours.  Also, if you no show three or more times for appointments you may be dismissed from the clinic at the providers discretion.     Again, thank you for choosing [facility].  Our hope is that these requests will decrease the amount of time that you wait before being seen by our physicians.       _____________________________________________________________  Should you have questions after your visit to [facility], please contact our office at [phone] between the hours of 8:30 a.m. and 4:30 p.m.  Voicemails left after 4:30 p.m. will not be returned until the following business day.  For prescription refill requests, have your pharmacy  contact our office.       Resources For Cancer Patients and their Caregivers ? American Cancer Society: Can assist with transportation, wigs, general needs, runs Look Good Feel Better.        820-669-2763 ? Cancer Care: Provides financial assistance, online support groups, medication/co-pay assistance.  1-800-813-HOPE (803)662-4388) ? [place] Assists [place] cancer patients and their families through emotional , educational and financial support.  781-539-3451 ? [place] DSS Where to apply for food stamps, Medicaid and utility assistance. 562-403-8891 ? [facility]: Transportation to medical appointments. (508) 626-6774 ? Social Security Administration: May apply for disability if have a Stage IV cancer. 267 562 3430 581-266-9100 ? [employer], Disability and Transit Services: Assists with nutrition, care and transit needs. [place] Support Programs: @10RELATIVEDAYS @ > Cancer Support Group  2nd Tuesday of the month 1pm-2pm, Journey Room  > Creative Journey  3rd Tuesday of the month 1130am-1pm, Journey Room  > Look Good Feel Better  1st Wednesday of the month 10am-12 noon, Journey Room (Call [place] to register [phone])

## 2016-05-28 NOTE — Progress Notes (Signed)
Platelets 87,000. Per T.Kefalas,PA defer treatment x 1 week. Ashley Doyle presented for Portacath access and flush. Proper placement of portacath confirmed by CXR. Portacath located right chest wall accessed with  H 20 needle. Good blood return present. Portacath flushed with 79ml NS and 500U/77ml Heparin and needle removed intact. Procedure without incident. Patient tolerated procedure well.

## 2016-05-30 LAB — URINE CULTURE: Culture: NO GROWTH

## 2016-06-04 ENCOUNTER — Ambulatory Visit (HOSPITAL_COMMUNITY): Payer: Medicare Other

## 2016-06-04 ENCOUNTER — Telehealth (HOSPITAL_COMMUNITY): Payer: Self-pay | Admitting: Hematology & Oncology

## 2016-06-04 NOTE — Telephone Encounter (Signed)
FAXED RETRO REQ TO [facility] FOR CHEMO FOR DOS 05/15/16 AND REQ FOR FUTURE DATES.Marland [place] INCORRECT INS INFO WAS ON PTS ACCOUNT SO WHEN I REVIEWED THE REF AND APPT DESK IT ONLY SHOWED MEDICARE AS THE INS.HOWEVER TODAY THE MEDICARE AND [employer] WERE BOTH LISTED BUT OF COURSE [employer] IS THE PRIMARY

## 2016-06-04 NOTE — Progress Notes (Deleted)
Patient has not shown for chemotherapy appointment. No answer to cell phone (only available number). Message left on voicemail regarding no show for chemo appointment today and to please call clinic asap.

## 2016-06-07 ENCOUNTER — Other Ambulatory Visit (HOSPITAL_COMMUNITY): Payer: Self-pay | Admitting: Emergency Medicine

## 2016-06-07 ENCOUNTER — Telehealth (HOSPITAL_COMMUNITY): Payer: Self-pay | Admitting: *Deleted

## 2016-06-07 DIAGNOSIS — R05 Cough: Secondary | ICD-10-CM

## 2016-06-07 DIAGNOSIS — R059 Cough, unspecified: Secondary | ICD-10-CM

## 2016-06-07 MED ORDER — HYDROCODONE-HOMATROPINE 5-1.5 MG/5ML PO SYRP: 5.0000 mL | ORAL_SOLUTION | Freq: Four times a day (QID) | ORAL | 0 refills | Status: DC | PRN

## 2016-06-07 NOTE — Telephone Encounter (Signed)
Left patient message she has to come pick up prescription.

## 2016-06-07 NOTE — Progress Notes (Signed)
Pt called and stated that she had been feeling really bad but wanted to be added back to chemo now.  Dr Whitney Muse said that she needs to be seen prior to starting chemo.  She wanted a refill on cough syrup also.

## 2016-06-12 ENCOUNTER — Ambulatory Visit (HOSPITAL_COMMUNITY): Payer: Medicare Other | Admitting: Oncology

## 2016-06-12 ENCOUNTER — Ambulatory Visit (HOSPITAL_COMMUNITY): Payer: Medicare Other

## 2016-06-13 ENCOUNTER — Encounter (HOSPITAL_BASED_OUTPATIENT_CLINIC_OR_DEPARTMENT_OTHER): Payer: Medicare HMO | Admitting: Adult Health

## 2016-06-13 ENCOUNTER — Encounter (HOSPITAL_COMMUNITY): Payer: Medicare HMO | Attending: Oncology

## 2016-06-13 ENCOUNTER — Other Ambulatory Visit (HOSPITAL_COMMUNITY): Payer: Self-pay | Admitting: Oncology

## 2016-06-13 ENCOUNTER — Encounter (HOSPITAL_COMMUNITY): Payer: Self-pay | Admitting: Adult Health

## 2016-06-13 ENCOUNTER — Ambulatory Visit (HOSPITAL_COMMUNITY)
Admission: RE | Admit: 2016-06-13 | Discharge: 2016-06-13 | Disposition: A | Discharge status: Home / Self Care | Payer: Medicare HMO | Source: Ambulatory Visit | Attending: Adult Health | Admitting: Adult Health

## 2016-06-13 VITALS — BP 117/55 | HR 91 | Temp 99.0°F | Resp 20

## 2016-06-13 VITALS — BP 148/62 | HR 101 | Temp 97.8°F | Resp 20 | Wt 168.7 lb

## 2016-06-13 DIAGNOSIS — Z5111 Encounter for antineoplastic chemotherapy: Secondary | ICD-10-CM | POA: Diagnosis not present

## 2016-06-13 DIAGNOSIS — C50911 Malignant neoplasm of unspecified site of right female breast: Secondary | ICD-10-CM

## 2016-06-13 DIAGNOSIS — R05 Cough: Secondary | ICD-10-CM | POA: Diagnosis not present

## 2016-06-13 DIAGNOSIS — R0602 Shortness of breath: Secondary | ICD-10-CM | POA: Diagnosis not present

## 2016-06-13 DIAGNOSIS — E876 Hypokalemia: Secondary | ICD-10-CM

## 2016-06-13 DIAGNOSIS — R059 Cough, unspecified: Secondary | ICD-10-CM

## 2016-06-13 DIAGNOSIS — Z170 Estrogen receptor positive status [ER+]: Secondary | ICD-10-CM

## 2016-06-13 DIAGNOSIS — C773 Secondary and unspecified malignant neoplasm of axilla and upper limb lymph nodes: Secondary | ICD-10-CM

## 2016-06-13 DIAGNOSIS — I429 Cardiomyopathy, unspecified: Secondary | ICD-10-CM | POA: Diagnosis not present

## 2016-06-13 DIAGNOSIS — M898X9 Other specified disorders of bone, unspecified site: Secondary | ICD-10-CM | POA: Diagnosis not present

## 2016-06-13 DIAGNOSIS — C50411 Malignant neoplasm of upper-outer quadrant of right female breast: Secondary | ICD-10-CM

## 2016-06-13 DIAGNOSIS — F419 Anxiety disorder, unspecified: Secondary | ICD-10-CM

## 2016-06-13 LAB — CBC WITH DIFFERENTIAL/PLATELET
BASOS PCT: 1 %
Basophils Absolute: 0.1 10*3/uL (ref 0.0–0.1)
EOS ABS: 0.1 10*3/uL (ref 0.0–0.7)
EOS PCT: 1 %
HCT: 28.7 % — ABNORMAL LOW (ref 36.0–46.0)
HEMOGLOBIN: 10.2 g/dL — AB (ref 12.0–15.0)
LYMPHS ABS: 1.3 10*3/uL (ref 0.7–4.0)
Lymphocytes Relative: 13 %
MCH: 32.8 pg (ref 26.0–34.0)
MCHC: 35.5 g/dL (ref 30.0–36.0)
MCV: 92.3 fL (ref 78.0–100.0)
Monocytes Absolute: 1.3 10*3/uL — ABNORMAL HIGH (ref 0.1–1.0)
Monocytes Relative: 13 %
NEUTROS PCT: 72 %
Neutro Abs: 7.8 10*3/uL — ABNORMAL HIGH (ref 1.7–7.7)
PLATELETS: 151 10*3/uL (ref 150–400)
RBC: 3.11 MIL/uL — AB (ref 3.87–5.11)
RDW: 19.4 % — ABNORMAL HIGH (ref 11.5–15.5)
WBC: 10.7 10*3/uL — AB (ref 4.0–10.5)

## 2016-06-13 LAB — COMPREHENSIVE METABOLIC PANEL
ALBUMIN: 3.8 g/dL (ref 3.5–5.0)
ALK PHOS: 69 U/L (ref 38–126)
ALT: 15 U/L (ref 14–54)
ANION GAP: 10 (ref 5–15)
AST: 17 U/L (ref 15–41)
BUN: 13 mg/dL (ref 6–20)
CHLORIDE: 94 mmol/L — AB (ref 101–111)
CO2: 26 mmol/L (ref 22–32)
Calcium: 8.8 mg/dL — ABNORMAL LOW (ref 8.9–10.3)
Creatinine, Ser: 0.96 mg/dL (ref 0.44–1.00)
GFR calc non Af Amer: 59 mL/min — ABNORMAL LOW (ref >60)
GLUCOSE: 117 mg/dL — AB (ref 65–99)
Potassium: 3.1 mmol/L — ABNORMAL LOW (ref 3.5–5.1)
SODIUM: 130 mmol/L — AB (ref 135–145)
Total Bilirubin: 1 mg/dL (ref 0.3–1.2)
Total Protein: 6.7 g/dL (ref 6.5–8.1)

## 2016-06-13 LAB — BRAIN NATRIURETIC PEPTIDE: B Natriuretic Peptide: 387 pg/mL — ABNORMAL HIGH (ref 0.0–100.0)

## 2016-06-13 MED ORDER — SODIUM CHLORIDE 0.9 % IV SOLN
500.0000 mg/m2 | Freq: Once | INTRAVENOUS | Status: AC
  Administered 2016-06-13: 960 mg via INTRAVENOUS
  Filled 2016-06-13: qty 48

## 2016-06-13 MED ORDER — LORATADINE 10 MG PO TABS
10.0000 mg | ORAL_TABLET | Freq: Once | ORAL | Status: DC
  Filled 2016-06-13: qty 1

## 2016-06-13 MED ORDER — LORATADINE 10 MG PO TABS
10.0000 mg | ORAL_TABLET | Freq: Every day | ORAL | Status: DC
  Administered 2016-06-13: 10 mg via ORAL

## 2016-06-13 MED ORDER — PALONOSETRON HCL INJECTION 0.25 MG/5ML
0.2500 mg | Freq: Once | INTRAVENOUS | Status: AC
  Administered 2016-06-13: 0.25 mg via INTRAVENOUS
  Filled 2016-06-13: qty 5

## 2016-06-13 MED ORDER — SODIUM CHLORIDE 0.9 % IV SOLN
Freq: Once | INTRAVENOUS | Status: AC
  Administered 2016-06-13: 13:00:00 via INTRAVENOUS

## 2016-06-13 MED ORDER — POTASSIUM CHLORIDE CRYS ER 20 MEQ PO TBCR: 40.0000 meq | EXTENDED_RELEASE_TABLET | Freq: Two times a day (BID) | ORAL | 1 refills | Status: DC

## 2016-06-13 MED ORDER — DOXORUBICIN HCL CHEMO IV INJECTION 2 MG/ML
50.0000 mg/m2 | Freq: Once | INTRAVENOUS | Status: DC
  Filled 2016-06-13: qty 48

## 2016-06-13 MED ORDER — HEPARIN SOD (PORK) LOCK FLUSH 100 UNIT/ML IV SOLN
500.0000 [IU] | Freq: Once | INTRAVENOUS | Status: AC | PRN
  Administered 2016-06-13: 500 [IU]

## 2016-06-13 MED ORDER — PEGFILGRASTIM 6 MG/0.6ML ~~LOC~~ PSKT: 6.0000 mg | PREFILLED_SYRINGE | Freq: Once | SUBCUTANEOUS | Status: DC

## 2016-06-13 MED ORDER — FOSAPREPITANT DIMEGLUMINE INJECTION 150 MG
Freq: Once | INTRAVENOUS | Status: AC
  Administered 2016-06-13: 13:00:00 via INTRAVENOUS
  Filled 2016-06-13: qty 5

## 2016-06-13 MED ORDER — LORAZEPAM 0.5 MG PO TABS: 0.5000 mg | ORAL_TABLET | Freq: Three times a day (TID) | ORAL | 0 refills | Status: DC

## 2016-06-13 NOTE — Progress Notes (Signed)
[place], [place] [address]   CLINIC:  Medical Oncology/Hematology  PCP:  Wende Neighbors, MD [address] [place] [address] [phone]   REASON FOR VISIT:  Follow-up for Stage IIIA right breast invasive ductal carcinoma, ER+/PR+/HER2-  CURRENT THERAPY: Neoadjuvant chemo, Adriamycin/Cytoxan, due for cycle #4 today.   BRIEF ONCOLOGIC HISTORY:    Invasive ductal carcinoma of breast, female, right ([facility])   02/21/2016 Mammogram    Irregular vascular mass in the right breast at 10 o'clock, 10 cm the nipple, measuring 2.9 x 2.5 x 2.6 cm. In the right axilla there is 3 discrete enlarged abnormal appearing lymph nodes. Largest, which lies lateral to the palpable mass, measures 1.9 x 1.3 x 1.9 cm.      02/28/2016 Procedure    Right needle core biopsy in 10:00 position and right axillary biopsy      02/28/2016 Procedure    Postprocedure mammogram for clip placement. Ribbon shaped clip well-positioned within the biopsied mass in the right breast at the 10 o'clock axis. Wing shaped clip is well-positioned within the biopsied lymph node as seen on postprocedure ultrasound evaluation.  Final Assessment: Post Procedure Mammograms for Marker Placement      03/01/2016 Pathology Results    Invasive ductal carcinoma with extracellular mucin of right breast mass and ductal carcinoma of right axillary lymph node.  Grade 2.  ER 100%, PR 100%, Ki-67 12%, HER2 NEGATIVE.      03/12/2016 Imaging    CT CAP- Right breast cancer with associated right axillary metastatic lymph nodes. 2. No evidence of distant metastatic disease. 3. Hepatic steatosis.      03/26/2016 Breast MRI    2.7 cm spiculated mass in the upper-outer quadrant of right breast corresponding with the invasive ductal carcinoma. Masses in the right axilla consistent with axillary adenopathy.      04/16/2016 -  Chemotherapy    The patient had DOXOrubicin (ADRIAMYCIN) chemo  injection 116 mg, 60 mg/m2 = 116 mg, Intravenous,  Once, 3 of 4 cycles  palonosetron (ALOXI) injection 0.25 mg, 0.25 mg, Intravenous,  Once, 3 of 4 cycles  pegfilgrastim (NEULASTA ONPRO KIT) injection 6 mg, 6 mg, Subcutaneous, Once, 3 of 4 cycles  cyclophosphamide (CYTOXAN) 1,160 mg in sodium chloride 0.9 % 250 mL chemo infusion, 600 mg/m2 = 1,160 mg, Intravenous,  Once, 3 of 4 cycles  PACLitaxel (TAXOL) 156 mg in dextrose 5 % 250 mL chemo infusion ( fosaprepitant (EMEND) 150 mg, dexamethasone (DECADRON) 12 mg in sodium chloride 0.9 % 145 mL IVPB, , Intravenous,  Once, 3 of 4 cycles  for chemotherapy treatment.        05/28/2016 Adverse Reaction    Thrombocytopenia      05/28/2016 Treatment Plan Change    AC #4 delayed x 1 week       INTERVAL HISTORY:  Ashley Doyle is here with her son for consideration for cycle #4 of Adriamycin/Cytoxan. This cycle of chemotherapy was originally due on 05/28/16, but was delayed due to thrombocytopenia with platelet count of 87,000.   She has been struggling with a productive cough for about 3 weeks. She had chest x-ray on 05/28/16 was negative for pneumonia. She states, "I coughed so hard sometimes that I throw up." She does have Phenergan for nausea, which is helpful. Her biggest concern today is "horrible bone pain to my neck" on the third day after chemotherapy. She endorses worsening arthralgias after chemotherapy as well.  Her energy levels  have been pretty poor. She called her son and told him that she wanted to stop chemotherapy. Her son, who is here with her today, came to be with his mother from [place] so he could be present for today's visit. He shares that he wants her to continue treatment, and wants [place] to talk about her options today.  Ashley Doyle states that she does want to finish treatment, "but I'm just so tired of feeling so bad, and the pain is so bad. Sometimes when I get so worked up and that makes everything worse."  She hasn't been taking  her "stress medicine" (Celexa).     REVIEW OF SYSTEMS:  Review of Systems  Constitutional: Positive for fatigue. Negative for chills and fever.  HENT:  Negative.   Eyes: Negative.   Respiratory: Positive for cough (productive cough with gray sputum; sometimes yellow sputum) and shortness of breath.   Gastrointestinal: Positive for nausea and vomiting. Negative for abdominal pain, blood in stool, constipation and diarrhea.  Endocrine: Negative.   Genitourinary: Negative.  Negative for dysuria, hematuria and vaginal bleeding.   Neurological: Positive for headaches (chronic headaches).  Hematological: Negative.   Psychiatric/Behavioral: The patient is nervous/anxious.      PAST MEDICAL/SURGICAL HISTORY:  Past Medical History:  Diagnosis Date  . Anxiety   . Arthritis   . Asthma   . Breast cancer ([facility])   . COPD (chronic obstructive pulmonary disease) ([facility])   . Depression   . Emphysema of lung ([facility])   . Family history of adverse reaction to anesthesia    sister has PONV  . GERD (gastroesophageal reflux disease)   . HOH (hard of hearing)   . Hypertension   . Invasive ductal carcinoma of breast, female, right ([facility]) 03/07/2016  . Pinched nerve in neck    and back  . Stroke ([facility])   . Tuberculosis    "several years ago" 04/11/16  . Wears glasses   . Wears hearing aid    left ear   Past Surgical History:  Procedure Laterality Date  . ABDOMINAL HYSTERECTOMY    . APPENDECTOMY    . INNER EAR SURGERY Right   . PORTACATH PLACEMENT N/A 04/12/2016   Procedure: INSERTION PORT-A-CATH WITH [place];  Surgeon: Rolm Bookbinder, MD;  Location: [facility];  Service: General;  Laterality: N/A;     SOCIAL HISTORY:  Social History   Social History  . Marital status: Married    Spouse name: N/A  . Number of children: N/A  . Years of education: N/A   Occupational History  . Not on file.   Social History Main Topics  . Smoking status: Current Every Day Smoker    Packs/day: 2.00    Years:  54.00    Types: Cigarettes  . Smokeless tobacco: Never Used  . Alcohol use No  . Drug use: No  . Sexual activity: Not on file     Comment: hysterectomy   Other Topics Concern  . Not on file   Social History Narrative  . No narrative on file    FAMILY HISTORY:  Family History  Problem Relation Age of Onset  . Stroke Mother   . Heart disease Mother   . Lung cancer Father     CURRENT MEDICATIONS:  Outpatient Encounter Prescriptions as of 06/13/2016  Medication Sig  . citalopram (CELEXA) 40 MG tablet Take 40 mg by mouth daily.  Marland Kitchen dexamethasone (DECADRON) 4 MG tablet Take 2 tablets by mouth once a day on the day  after chemotherapy and then take 2 tablets two times a day for 2 days. Take with food.  Marland Kitchen HYDROcodone-acetaminophen (NORCO/VICODIN) 5-325 MG tablet Take 1 tablet by mouth 3 times daily as needed for pain  . HYDROcodone-homatropine (HYCODAN) 5-1.5 MG/5ML syrup Take 5 mLs by mouth every 6 (six) hours as needed for cough.  . lidocaine-prilocaine (EMLA) cream Apply to affected area once  . LORazepam (ATIVAN) 0.5 MG tablet Take 1 tablet (0.5 mg total) by mouth every 8 (eight) hours.  Marland Kitchen omeprazole (PRILOSEC) 20 MG capsule Take 1 capsule (20 mg total) by mouth daily.  . ondansetron (ZOFRAN) 8 MG tablet Take 1 tablet (8 mg total) by mouth every 8 (eight) hours as needed. Start on the third day after chemotherapy.  Marland Kitchen oxyCODONE-acetaminophen (PERCOCET) 10-325 MG tablet Take 1 tablet by mouth every 4 (four) hours as needed for pain.  . potassium chloride SA (K-DUR,KLOR-CON) 20 MEQ tablet Take 2 tablets (40 mEq total) by mouth 2 (two) times daily.  . prochlorperazine (COMPAZINE) 10 MG tablet Take 1 tablet (10 mg total) by mouth every 6 (six) hours as needed (Nausea or vomiting).  . promethazine (PHENERGAN) 25 MG tablet Take 0.5 tablets (12.5 mg total) by mouth every 6 (six) hours as needed for nausea or vomiting.  . triamterene-hydrochlorothiazide (MAXZIDE-25) 37.5-25 MG tablet Take 1  tablet by mouth daily.   Facility-Administered Encounter Medications as of 06/13/2016  Medication  . loratadine (CLARITIN) tablet 10 mg    ALLERGIES:  No Known Allergies   PHYSICAL EXAM:  ECOG Performance status: 2, Symptomatic <50% time spent in bed.   Physical Exam  Constitutional: She is oriented to person, place, and time and well-developed, well-nourished, and in no distress.  HENT:  Head: Normocephalic.  Mouth/Throat: Oropharynx is clear and moist. No oropharyngeal exudate.  Eyes: Conjunctivae are normal. Pupils are equal, round, and reactive to light. No scleral icterus.  Neck: Normal range of motion.  Cardiovascular: Regular rhythm.   Pulmonary/Chest: Effort normal. No respiratory distress. She has rales in the right upper field, the right middle field, the right lower field, the left upper field and the left lower field.  Abdominal: Soft. Bowel sounds are normal. There is no tenderness.  Musculoskeletal: Normal range of motion. She exhibits no edema.  Lymphadenopathy:    She has no cervical adenopathy.  Neurological: She is alert and oriented to person, place, and time. No cranial nerve deficit. Gait normal.  Skin: Skin is warm and dry. There is pallor.  Psychiatric: Mood, memory, affect and judgment normal.     LABORATORY DATA:  I have reviewed the labs as listed.  CBC    Component Value Date/Time   WBC 10.7 (H) 06/13/2016 1041   RBC 3.11 (L) 06/13/2016 1041   HGB 10.2 (L) 06/13/2016 1041   HCT 28.7 (L) 06/13/2016 1041   PLT 151 06/13/2016 1041   MCV 92.3 06/13/2016 1041   MCH 32.8 06/13/2016 1041   MCHC 35.5 06/13/2016 1041   RDW 19.4 (H) 06/13/2016 1041   LYMPHSABS 1.3 06/13/2016 1041   MONOABS 1.3 (H) 06/13/2016 1041   EOSABS 0.1 06/13/2016 1041   BASOSABS 0.1 06/13/2016 1041   CMP Latest Ref Rng & Units 06/13/2016 05/28/2016 05/15/2016  Glucose 65 - 99 mg/dL 117(H) 111(H) 114(H)  BUN 6 - 20 mg/dL '13 8 10  ' Creatinine 0.44 - 1.00 mg/dL 0.96 0.98 0.82    Sodium 135 - 145 mmol/L 130(L) 132(L) 133(L)  Potassium 3.5 - 5.1 mmol/L 3.1(L) 4.0 4.3  Chloride 101 - 111 mmol/L 94(L) 101 102  CO2 22 - 32 mmol/L '26 24 27  ' Calcium 8.9 - 10.3 mg/dL 8.8(L) 8.8(L) 8.7(L)  Total Protein 6.5 - 8.1 g/dL 6.7 6.0(L) 6.5  Total Bilirubin 0.3 - 1.2 mg/dL 1.0 0.6 0.4  Alkaline Phos 38 - 126 U/L 69 107 105  AST 15 - 41 U/L '17 19 16  ' ALT 14 - 54 U/L 15 12(L) 12(L)   Results for BELEN, PESCH (MRN [id number])   Ref. Range 06/13/2016 12:10  B Natriuretic Peptide Latest Ref Range: 0.0 - 100.0 pg/mL 387.0 (H)   PENDING LABS:    DIAGNOSTIC IMAGING:    PATHOLOGY:  Breast biopsy: 02/28/16       ASSESSMENT & PLAN:   Stage IIIA right breast cancer:  -Adriamycin discontinued from cycle #4 today for concerns of cardiotoxicity (see below).  I acknowledged & validated her concerns regarding her bone pain (see below) and not feeling well after her previous cycles of Adriamycin/Cytoxan. We discussed her treatment plan going forward with weekly Taxol, which will likely be much better tolerated by the patient.  I offered support and encouragement.   After lengthy discussion she stated, "I feel better about this now."  We briefly reviewed common side effects of Taxol, including hair loss and peripheral neuropathy. We will monitor her closely during the next phase of her treatment.  She will receive Cytoxan only today, which was dose-reduced by about 25%, which will likely dramatically improve many of her treatment-related side effects she has experienced with the previous 3 cycles. She agreed to proceed with treatment.   Concern for cardiotoxicity:  -BNP collected and is elevated at 387.  Her lungs with moist rales on physical exam.  She does have productive cough with reported gray sputum (occasionally yellow per patient).  CXR obtained prior to chemotherapy today and was negative for pneumonia. Patient does smoke cigarettes and has h/o emphysema.  Previous CXRs have  noted cardiomegaly. Given her physical exam, cough, fatigue, elevated BNP, & concern for worsening heart function, we will discontinue the Adriamycin from her treatment plan for cycle #4.  She does not have lower extremity edema.  I explained to Ashley Doyle and her son that because Adriamycin is potentially cardiotoxic, we will forgo her last dose.  As a result, she will not require G-CSF support, which may help prevent her severe bone pain she has been experiencing after treatment with previous cycles.  The likely small benefit of additional dose of Adriamycin vs possible risks discussed with Dr. Twana First, who also saw and examined patient today.  ECHO ordered to be done sometime within the next week or so.  If worsening EF, then we will refer to Cardio-Oncology Clinic with Dr. Haroldine Laws.  I will hold off on diuresis for now, given her already present hypokalemia. Her O2 sats are stable on room air; we will continue to monitor and give gentle diuresis in the future if needed.    Bone pain  -Discussed with patient that likely etiology of bone pain 3 days after treatment was her G-CSF support.  I explained the pharmodynamics of this medication and why she may be experiencing bone pain.  She was given a dose of Claritin today, in preparation for receiving G-CSF support.  However, since Adriamycin was discontinued from her pain, she likely will not need continued Claritin.    Anxiety/Nausea management -Provided prescription Ativan 0.5 mg po Q8Hprn, #30, no refills. Encouraged patient to use for either nausea  or anxiety, as needed.  Continue to use Phenergan as needed as well.    Hypokalemia:  -She was previously prescribed potassium supplementation with K-Dur 40 mEq BID, but she reports not taking it consistently.  I recommended she resume this medication as previously prescribed, as her potassium was low today at 3.1.  I will refill her K-Dur prescription as she was not sure how many pills she had left at  home; E-prescribed to [facility].     Dispo:  -ECHO sometime within the next week to evaluate possible cardiotoxicity.  -Return to clinic in 3 weeks for cycle #1 Taxol.    All questions were answered to patient's stated satisfaction. Encouraged patient to call with any new concerns or questions before her next visit to the cancer center and we can certain see her sooner, if needed.     Plan of care discussed with Dr. Talbert Cage, who agrees with the above aforementioned.  Please see her note for Attending Physician attestation.   Mike Craze, NP [place] [phone]

## 2016-06-13 NOTE — Progress Notes (Signed)
Tolerated tx w/o adverse reaction.  Alert, in no distress.  VSS.  Discharged ambulatory in c/o family.  

## 2016-06-13 NOTE — Patient Instructions (Signed)
[place] at [facility] Discharge Instructions  RECOMMENDATIONS MADE BY THE CONSULTANT AND ANY TEST RESULTS WILL BE SENT TO YOUR REFERRING PHYSICIAN.  Exam with Mike Craze, NP. We are going to treat you today as well as do labs, but your chemotherapy dose will be reduced to help you tolerate it better. Chest xray today to evaluate your lungs due to your congestion. Echo test of your heart will be scheduled as well so we can monitor your heart function. We have prescribed Claritin for you to take to help with the pain associated with the Neulasta medication that you get with treatment. We have also prescribed Ativan to help with your anxiety as well as nausea with treatment. We will see you for you next treatment as well as follow up with the provider on that day.     Thank you for choosing [place] at [facility] to provide your oncology and hematology care.  To afford each patient quality time with our provider, please arrive at least 15 minutes before your scheduled appointment time.    If you have a lab appointment with the [facility] please come in thru the  Main Entrance and check in at the main information desk  You need to re-schedule your appointment should you arrive 10 or more minutes late.  We strive to give you quality time with our providers, and arriving late affects you and other patients whose appointments are after yours.  Also, if you no show three or more times for appointments you may be dismissed from the clinic at the providers discretion.     Again, thank you for choosing [facility].  Our hope is that these requests will decrease the amount of time that you wait before being seen by our physicians.       _____________________________________________________________  Should you have questions after your visit to [facility], please contact our office at [phone] between the hours  of 8:30 a.m. and 4:30 p.m.  Voicemails left after 4:30 p.m. will not be returned until the following business day.  For prescription refill requests, have your pharmacy contact our office.       Resources For Cancer Patients and their Caregivers ? American Cancer Society: Can assist with transportation, wigs, general needs, runs Look Good Feel Better.        228 274 8735 ? Cancer Care: Provides financial assistance, online support groups, medication/co-pay assistance.  1-800-813-HOPE 863-211-3140) ? [place] Assists [place] cancer patients and their families through emotional , educational and financial support.  605-118-6385 ? [place] DSS Where to apply for food stamps, Medicaid and utility assistance. (239)114-9337 ? RCATS: Transportation to medical appointments. 317-248-6961 ? Social Security Administration: May apply for disability if have a Stage IV cancer. 437-788-9558 (743) 031-6305 ? [employer], Disability and Transit Services: Assists with nutrition, care and transit needs. [place] Support Programs: @10RELATIVEDAYS @ > Cancer Support Group  2nd Tuesday of the month 1pm-2pm, Journey Room  > Creative Journey  3rd Tuesday of the month 1130am-1pm, Journey Room  > Look Good Feel Better  1st Wednesday of the month 10am-12 noon, Journey Room (Call [place] to register [phone])

## 2016-06-14 ENCOUNTER — Ambulatory Visit (HOSPITAL_COMMUNITY)
Admission: RE | Admit: 2016-06-14 | Discharge: 2016-06-14 | Disposition: A | Discharge status: Home / Self Care | Payer: Medicare HMO | Source: Ambulatory Visit | Attending: Adult Health | Admitting: Adult Health

## 2016-06-14 DIAGNOSIS — I429 Cardiomyopathy, unspecified: Secondary | ICD-10-CM

## 2016-06-14 DIAGNOSIS — C50911 Malignant neoplasm of unspecified site of right female breast: Secondary | ICD-10-CM

## 2016-06-14 DIAGNOSIS — I517 Cardiomegaly: Secondary | ICD-10-CM | POA: Diagnosis not present

## 2016-06-14 LAB — ECHOCARDIOGRAM LIMITED
FS: 32 % (ref 28–44)
IV/PV OW: 0.93
LA diam index: 1.83 cm/m2
LASIZE: 34 mm
LEFT ATRIUM END SYS DIAM: 34 mm
LV PW d: 10.1 mm — AB (ref 0.6–1.1)
LV SIMPSON'S DISK: 61
LV dias vol index: 31 mL/m2
LV dias vol: 57 mL (ref 46–106)
LV sys vol: 22 mL (ref 14–42)
LVSYSVOLIN: 12 mL/m2
Stroke v: 35 ml

## 2016-06-14 NOTE — Progress Notes (Signed)
*  PRELIMINARY RESULTS* Echocardiogram 2D Echocardiogram has been performed.  Ashley Doyle 06/14/2016, 2:03 PM

## 2016-06-18 ENCOUNTER — Other Ambulatory Visit (HOSPITAL_COMMUNITY): Payer: Self-pay | Admitting: Emergency Medicine

## 2016-06-18 DIAGNOSIS — R05 Cough: Secondary | ICD-10-CM

## 2016-06-18 DIAGNOSIS — R059 Cough, unspecified: Secondary | ICD-10-CM

## 2016-06-18 MED ORDER — OXYCODONE-ACETAMINOPHEN 10-325 MG PO TABS: 1.0000 | ORAL_TABLET | ORAL | 0 refills | Status: DC | PRN

## 2016-06-18 MED ORDER — HYDROCODONE-HOMATROPINE 5-1.5 MG/5ML PO SYRP: 5.0000 mL | ORAL_SOLUTION | Freq: Four times a day (QID) | ORAL | 0 refills | Status: DC | PRN

## 2016-06-18 NOTE — Progress Notes (Signed)
Pts husband called and stated wife needed pain medication refill and cough medication.  Refilled, but Ashley Rings NP states that if it is still persisting after this is done we will need to send her to a pulmonologist to see what is going on.  Verbalized understanding.

## 2016-06-19 ENCOUNTER — Ambulatory Visit (HOSPITAL_COMMUNITY): Payer: Medicare Other | Admitting: Oncology

## 2016-06-19 ENCOUNTER — Inpatient Hospital Stay (HOSPITAL_COMMUNITY): Payer: Medicare Other

## 2016-06-26 ENCOUNTER — Inpatient Hospital Stay (HOSPITAL_COMMUNITY): Payer: Medicare Other

## 2016-07-03 ENCOUNTER — Inpatient Hospital Stay (HOSPITAL_COMMUNITY): Payer: Medicare Other

## 2016-07-03 ENCOUNTER — Ambulatory Visit (HOSPITAL_COMMUNITY): Payer: Medicare Other | Admitting: Hematology & Oncology

## 2016-07-04 ENCOUNTER — Encounter (HOSPITAL_COMMUNITY): Payer: Medicare HMO | Attending: Oncology | Admitting: Adult Health

## 2016-07-04 ENCOUNTER — Other Ambulatory Visit: Payer: Self-pay

## 2016-07-04 ENCOUNTER — Encounter (HOSPITAL_BASED_OUTPATIENT_CLINIC_OR_DEPARTMENT_OTHER): Payer: Medicare HMO

## 2016-07-04 ENCOUNTER — Encounter (HOSPITAL_COMMUNITY): Payer: Self-pay | Admitting: Adult Health

## 2016-07-04 VITALS — BP 128/66 | HR 78 | Temp 98.1°F | Resp 18 | Wt 161.0 lb

## 2016-07-04 DIAGNOSIS — I10 Essential (primary) hypertension: Secondary | ICD-10-CM | POA: Diagnosis not present

## 2016-07-04 DIAGNOSIS — E785 Hyperlipidemia, unspecified: Secondary | ICD-10-CM | POA: Insufficient documentation

## 2016-07-04 DIAGNOSIS — Z800 Family history of malignant neoplasm of digestive organs: Secondary | ICD-10-CM | POA: Insufficient documentation

## 2016-07-04 DIAGNOSIS — Z170 Estrogen receptor positive status [ER+]: Secondary | ICD-10-CM

## 2016-07-04 DIAGNOSIS — J449 Chronic obstructive pulmonary disease, unspecified: Secondary | ICD-10-CM | POA: Diagnosis not present

## 2016-07-04 DIAGNOSIS — Z8673 Personal history of transient ischemic attack (TIA), and cerebral infarction without residual deficits: Secondary | ICD-10-CM | POA: Diagnosis not present

## 2016-07-04 DIAGNOSIS — Z8052 Family history of malignant neoplasm of bladder: Secondary | ICD-10-CM | POA: Insufficient documentation

## 2016-07-04 DIAGNOSIS — R110 Nausea: Secondary | ICD-10-CM

## 2016-07-04 DIAGNOSIS — C50411 Malignant neoplasm of upper-outer quadrant of right female breast: Secondary | ICD-10-CM | POA: Diagnosis not present

## 2016-07-04 DIAGNOSIS — R079 Chest pain, unspecified: Secondary | ICD-10-CM | POA: Diagnosis not present

## 2016-07-04 DIAGNOSIS — Z5111 Encounter for antineoplastic chemotherapy: Secondary | ICD-10-CM | POA: Diagnosis not present

## 2016-07-04 DIAGNOSIS — C50911 Malignant neoplasm of unspecified site of right female breast: Secondary | ICD-10-CM

## 2016-07-04 DIAGNOSIS — F17210 Nicotine dependence, cigarettes, uncomplicated: Secondary | ICD-10-CM | POA: Diagnosis not present

## 2016-07-04 DIAGNOSIS — C773 Secondary and unspecified malignant neoplasm of axilla and upper limb lymph nodes: Secondary | ICD-10-CM | POA: Diagnosis not present

## 2016-07-04 DIAGNOSIS — Z9221 Personal history of antineoplastic chemotherapy: Secondary | ICD-10-CM | POA: Insufficient documentation

## 2016-07-04 DIAGNOSIS — Z9889 Other specified postprocedural states: Secondary | ICD-10-CM | POA: Insufficient documentation

## 2016-07-04 DIAGNOSIS — F411 Generalized anxiety disorder: Secondary | ICD-10-CM

## 2016-07-04 LAB — COMPREHENSIVE METABOLIC PANEL
ALK PHOS: 71 U/L (ref 38–126)
ALT: 14 U/L (ref 14–54)
ANION GAP: 8 (ref 5–15)
AST: 27 U/L (ref 15–41)
Albumin: 3.7 g/dL (ref 3.5–5.0)
BILIRUBIN TOTAL: 0.8 mg/dL (ref 0.3–1.2)
BUN: 12 mg/dL (ref 6–20)
CO2: 24 mmol/L (ref 22–32)
Calcium: 9 mg/dL (ref 8.9–10.3)
Chloride: 101 mmol/L (ref 101–111)
Creatinine, Ser: 0.98 mg/dL (ref 0.44–1.00)
GFR, EST NON AFRICAN AMERICAN: 57 mL/min — AB (ref >60)
GLUCOSE: 102 mg/dL — AB (ref 65–99)
Potassium: 3.8 mmol/L (ref 3.5–5.1)
Sodium: 133 mmol/L — ABNORMAL LOW (ref 135–145)
Total Protein: 6.6 g/dL (ref 6.5–8.1)

## 2016-07-04 LAB — CBC WITH DIFFERENTIAL/PLATELET
Basophils Absolute: 0.1 10*3/uL (ref 0.0–0.1)
Basophils Relative: 1 %
Eosinophils Absolute: 0.2 10*3/uL (ref 0.0–0.7)
Eosinophils Relative: 4 %
HEMATOCRIT: 30.1 % — AB (ref 36.0–46.0)
HEMOGLOBIN: 9.8 g/dL — AB (ref 12.0–15.0)
LYMPHS ABS: 0.9 10*3/uL (ref 0.7–4.0)
Lymphocytes Relative: 18 %
MCH: 31.4 pg (ref 26.0–34.0)
MCHC: 32.6 g/dL (ref 30.0–36.0)
MCV: 96.5 fL (ref 78.0–100.0)
MONO ABS: 0.7 10*3/uL (ref 0.1–1.0)
MONOS PCT: 14 %
NEUTROS ABS: 2.9 10*3/uL (ref 1.7–7.7)
NEUTROS PCT: 63 %
Platelets: 237 10*3/uL (ref 150–400)
RBC: 3.12 MIL/uL — ABNORMAL LOW (ref 3.87–5.11)
RDW: 16.6 % — ABNORMAL HIGH (ref 11.5–15.5)
WBC: 4.7 10*3/uL (ref 4.0–10.5)

## 2016-07-04 MED ORDER — HEPARIN SOD (PORK) LOCK FLUSH 100 UNIT/ML IV SOLN
INTRAVENOUS | Status: AC
  Filled 2016-07-04: qty 5

## 2016-07-04 MED ORDER — HEPARIN SOD (PORK) LOCK FLUSH 100 UNIT/ML IV SOLN
500.0000 [IU] | Freq: Once | INTRAVENOUS | Status: AC | PRN
  Administered 2016-07-04: 500 [IU]

## 2016-07-04 MED ORDER — PACLITAXEL CHEMO INJECTION 300 MG/50ML
80.0000 mg/m2 | Freq: Once | INTRAVENOUS | Status: AC
  Administered 2016-07-04: 144 mg via INTRAVENOUS
  Filled 2016-07-04: qty 24

## 2016-07-04 MED ORDER — SODIUM CHLORIDE 0.9 % IV SOLN
Freq: Once | INTRAVENOUS | Status: AC
  Administered 2016-07-04: 12:00:00 via INTRAVENOUS

## 2016-07-04 MED ORDER — FAMOTIDINE IN NACL 20-0.9 MG/50ML-% IV SOLN
20.0000 mg | Freq: Once | INTRAVENOUS | Status: AC
  Administered 2016-07-04: 20 mg via INTRAVENOUS
  Filled 2016-07-04: qty 50

## 2016-07-04 MED ORDER — SODIUM CHLORIDE 0.9 % IV SOLN
20.0000 mg | Freq: Once | INTRAVENOUS | Status: AC
  Administered 2016-07-04: 20 mg via INTRAVENOUS
  Filled 2016-07-04: qty 2

## 2016-07-04 MED ORDER — PACLITAXEL CHEMO INJECTION 300 MG/50ML: 80.0000 mg/m2 | Freq: Once | INTRAVENOUS | Status: DC

## 2016-07-04 MED ORDER — DIPHENHYDRAMINE HCL 50 MG/ML IJ SOLN
50.0000 mg | Freq: Once | INTRAMUSCULAR | Status: AC
  Administered 2016-07-04: 50 mg via INTRAVENOUS
  Filled 2016-07-04: qty 1

## 2016-07-04 NOTE — Progress Notes (Signed)
Tolerated chemo well. Stable and ambulatory on discharge home with husband. 

## 2016-07-04 NOTE — Pre-Procedure Instructions (Signed)
EKG-07/04/16

## 2016-07-04 NOTE — Patient Instructions (Signed)
[place] at [facility] Discharge Instructions  RECOMMENDATIONS MADE BY THE CONSULTANT AND ANY TEST RESULTS WILL BE SENT TO YOUR REFERRING PHYSICIAN.  Exam with Mike Craze, NP. We are going to refer you to cardiology for your chest pains.   Taxol chemotherapy today and then weekly. Return as scheduled.  Please see Amy as you leave for your appointments.    Thank you for choosing [place] at [facility] to provide your oncology and hematology care.  To afford each patient quality time with our provider, please arrive at least 15 minutes before your scheduled appointment time.    If you have a lab appointment with the [facility] please come in thru the  Main Entrance and check in at the main information desk  You need to re-schedule your appointment should you arrive 10 or more minutes late.  We strive to give you quality time with our providers, and arriving late affects you and other patients whose appointments are after yours.  Also, if you no show three or more times for appointments you may be dismissed from the clinic at the providers discretion.     Again, thank you for choosing [facility].  Our hope is that these requests will decrease the amount of time that you wait before being seen by our physicians.       _____________________________________________________________  Should you have questions after your visit to [facility], please contact our office at [phone] between the hours of 8:30 a.m. and 4:30 p.m.  Voicemails left after 4:30 p.m. will not be returned until the following business day.  For prescription refill requests, have your pharmacy contact our office.       Resources For Cancer Patients and their Caregivers ? American Cancer Society: Can assist with transportation, wigs, general needs, runs Look Good Feel Better.        (819)659-8509 ? Cancer Care: Provides financial  assistance, online support groups, medication/co-pay assistance.  1-800-813-HOPE 936-697-8899) ? [place] Assists [place] cancer patients and their families through emotional , educational and financial support.  7125815795 ? [place] DSS Where to apply for food stamps, Medicaid and utility assistance. 8154065245 ? RCATS: Transportation to medical appointments. (479)801-9236 ? Social Security Administration: May apply for disability if have a Stage IV cancer. 8131916293 (636)369-3103 ? [employer], Disability and Transit Services: Assists with nutrition, care and transit needs. [place] Support Programs: @10RELATIVEDAYS @ > Cancer Support Group  2nd Tuesday of the month 1pm-2pm, Journey Room  > Creative Journey  3rd Tuesday of the month 1130am-1pm, Journey Room  > Look Good Feel Better  1st Wednesday of the month 10am-12 noon, Journey Room (Call [place] to register [phone])

## 2016-07-04 NOTE — Progress Notes (Signed)
[place], [place] [address]   CLINIC:  Medical Oncology/Hematology  PCP:  Wende Neighbors, MD [place] [id number] [phone]   REASON FOR VISIT:  Follow-up for Stage IIIA right breast invasive ductal carcinoma, ER+/PR+/HER2-  CURRENT THERAPY: Neoadjuvant chemo; currently Taxol, cycle #1   BRIEF ONCOLOGIC HISTORY:    Invasive ductal carcinoma of breast, female, right (Ashley Doyle)   02/21/2016 Mammogram    Irregular vascular mass in the right breast at 10 o'clock, 10 cm the nipple, measuring 2.9 x 2.5 x 2.6 cm. In the right axilla there is 3 discrete enlarged abnormal appearing lymph nodes. Largest, which lies lateral to the palpable mass, measures 1.9 x 1.3 x 1.9 cm.      02/28/2016 Procedure    Right needle core biopsy in 10:00 position and right axillary biopsy      02/28/2016 Procedure    Postprocedure mammogram for clip placement. Ribbon shaped clip well-positioned within the biopsied mass in the right breast at the 10 o'clock axis. Wing shaped clip is well-positioned within the biopsied lymph node as seen on postprocedure ultrasound evaluation.  Final Assessment: Post Procedure Mammograms for Marker Placement      03/01/2016 Pathology Results    Invasive ductal carcinoma with extracellular mucin of right breast mass and ductal carcinoma of right axillary lymph node.  Grade 2.  ER 100%, PR 100%, Ki-67 12%, HER2 NEGATIVE.      03/12/2016 Imaging    CT CAP- Right breast cancer with associated right axillary metastatic lymph nodes. 2. No evidence of distant metastatic disease. 3. Hepatic steatosis.      03/26/2016 Breast MRI    2.7 cm spiculated mass in the upper-outer quadrant of right breast corresponding with the invasive ductal carcinoma. Masses in the right axilla consistent with axillary adenopathy.      04/16/2016 -  Chemotherapy    The patient had DOXOrubicin (ADRIAMYCIN) chemo injection 116 mg, 60  mg/m2 = 116 mg, Intravenous,  Once, 3 of 4 cycles  palonosetron (ALOXI) injection 0.25 mg, 0.25 mg, Intravenous,  Once, 3 of 4 cycles  pegfilgrastim (NEULASTA ONPRO KIT) injection 6 mg, 6 mg, Subcutaneous, Once, 3 of 4 cycles  cyclophosphamide (CYTOXAN) 1,160 mg in sodium chloride 0.9 % 250 mL chemo infusion, 600 mg/m2 = 1,160 mg, Intravenous,  Once, 3 of 4 cycles  PACLitaxel (TAXOL) 156 mg in dextrose 5 % 250 mL chemo infusion ( fosaprepitant (EMEND) 150 mg, dexamethasone (DECADRON) 12 mg in sodium chloride 0.9 % 145 mL IVPB, , Intravenous,  Once, 3 of 4 cycles  for chemotherapy treatment.        05/28/2016 Adverse Reaction    Thrombocytopenia      05/28/2016 Treatment Plan Change    AC #4 delayed x 1 week      06/13/2016 Treatment Plan Change    Adriamycin d/c'd for cycle #4 due to concerns for cardiomyopathy/fluid volume overload.       06/14/2016 Echocardiogram    EF 60-65%       INTERVAL HISTORY:  Ashley Doyle is here with her son for consideration for cycle #1 of Taxol.  Her last cycle (cycle #4) of Adriamycin/Cytoxan was held for 1 week d/t thrombocytopenia.  Then, Adriamycin was discontinued for cycle #4 due to concerns for cardiomyopathy. ECHO done and showed preserved EF 60-65%.  She is here today to start her weekly Taxol.   Her cough is much improved from previous. She experiences occasional nausea  and vomiting, particularly when she over exerts her self. Endorses fatigue, which is chronic.  Of concern, she does report intermittent chest pain, that occurs approximately 2 times per day. This is been happening for quite some time. It improves with rest, and worsens with movement. She will take a pain pill occasionally, which helps with the chest pain. Denies heartburn, palpitations, or crushing chest pain sensation. She describes the pain as "achy." Endorses that she occasionally has diaphoresis at the time of increased chest pain episodes. Reports of occasional dizziness,  particularly when going from a sitting to standing position. To her knowledge, she has never had a heart attack nor is she aware if she has ever been diagnosed with heart disease.  She was given a prescription for Ativan at her last visit, to use as needed for her anxiety. She tells me she has not needed to use any of this medication. She reports being glad that she has finished her initial chemotherapy with Adriamycin/Cytoxan, and she is ready for her subsequent cycles of therapy with Taxol.  At baseline, she denies any peripheral neuropathy to her hands or feet.   REVIEW OF SYSTEMS:  Review of Systems  Constitutional: Positive for fatigue. Negative for chills and fever.  HENT:  Negative.   Eyes: Negative.   Respiratory: Positive for shortness of breath. Negative for cough.   Gastrointestinal: Positive for nausea and vomiting. Negative for abdominal pain, blood in stool, constipation and diarrhea.  Endocrine: Negative.   Genitourinary: Negative.  Negative for dysuria, hematuria and vaginal bleeding.   Neurological: Positive for headaches (chronic headaches).  Hematological: Negative.   Psychiatric/Behavioral: The patient is nervous/anxious.      PAST MEDICAL/SURGICAL HISTORY:  Past Medical History:  Diagnosis Date  . Anxiety   . Arthritis   . Asthma   . Breast cancer ([facility])   . COPD (chronic obstructive pulmonary disease) ([facility])   . Depression   . Emphysema of lung ([facility])   . Family history of adverse reaction to anesthesia    sister has PONV  . GERD (gastroesophageal reflux disease)   . HOH (hard of hearing)   . Hypertension   . Invasive ductal carcinoma of breast, female, right ([facility]) 03/07/2016  . Pinched nerve in neck    and back  . Stroke ([facility])   . Tuberculosis    "several years ago" 04/11/16  . Wears glasses   . Wears hearing aid    left ear   Past Surgical History:  Procedure Laterality Date  . ABDOMINAL HYSTERECTOMY    . APPENDECTOMY    . INNER EAR SURGERY Right    . PORTACATH PLACEMENT N/A 04/12/2016   Procedure: INSERTION PORT-A-CATH WITH [place];  Surgeon: Rolm Bookbinder, MD;  Location: [facility];  Service: General;  Laterality: N/A;     SOCIAL HISTORY:  Social History   Social History  . Marital status: Married    Spouse name: N/A  . Number of children: N/A  . Years of education: N/A   Occupational History  . Not on file.   Social History Main Topics  . Smoking status: Current Every Day Smoker    Packs/day: 2.00    Years: 54.00    Types: Cigarettes  . Smokeless tobacco: Never Used  . Alcohol use No  . Drug use: No  . Sexual activity: Not on file     Comment: hysterectomy   Other Topics Concern  . Not on file   Social History Narrative  . No narrative on  file    FAMILY HISTORY:  Family History  Problem Relation Age of Onset  . Stroke Mother   . Heart disease Mother   . Lung cancer Father     CURRENT MEDICATIONS:  Outpatient Encounter Prescriptions as of 07/04/2016  Medication Sig  . citalopram (CELEXA) 40 MG tablet Take 40 mg by mouth daily.  Marland Kitchen dexamethasone (DECADRON) 4 MG tablet Take 2 tablets by mouth once a day on the day after chemotherapy and then take 2 tablets two times a day for 2 days. Take with food.  Marland Kitchen HYDROcodone-acetaminophen (NORCO/VICODIN) 5-325 MG tablet Take 1 tablet by mouth 3 times daily as needed for pain  . HYDROcodone-homatropine (HYCODAN) 5-1.5 MG/5ML syrup Take 5 mLs by mouth every 6 (six) hours as needed for cough.  . lidocaine-prilocaine (EMLA) cream Apply to affected area once  . LORazepam (ATIVAN) 0.5 MG tablet Take 1 tablet (0.5 mg total) by mouth every 8 (eight) hours.  Marland Kitchen omeprazole (PRILOSEC) 20 MG capsule Take 1 capsule (20 mg total) by mouth daily.  . ondansetron (ZOFRAN) 8 MG tablet Take 1 tablet (8 mg total) by mouth every 8 (eight) hours as needed. Start on the third day after chemotherapy.  Marland Kitchen oxyCODONE-acetaminophen (PERCOCET) 10-325 MG tablet Take 1 tablet by mouth every 4 (four) hours  as needed for pain.  . potassium chloride SA (K-DUR,KLOR-CON) 20 MEQ tablet Take 2 tablets (40 mEq total) by mouth 2 (two) times daily.  . prochlorperazine (COMPAZINE) 10 MG tablet Take 1 tablet (10 mg total) by mouth every 6 (six) hours as needed (Nausea or vomiting).  . promethazine (PHENERGAN) 25 MG tablet Take 0.5 tablets (12.5 mg total) by mouth every 6 (six) hours as needed for nausea or vomiting.  . triamterene-hydrochlorothiazide (MAXZIDE-25) 37.5-25 MG tablet Take 1 tablet by mouth daily.   No facility-administered encounter medications on file as of 07/04/2016.     ALLERGIES:  No Known Allergies   PHYSICAL EXAM:  ECOG Performance status: 2, Symptomatic <50% time spent in bed.   Physical Exam  Constitutional: She is oriented to person, place, and time and well-developed, well-nourished, and in no distress.  HENT:  Head: Normocephalic.  Mouth/Throat: Oropharynx is clear and moist. No oropharyngeal exudate.  Hard of hearing; hearing aid in place  Eyes: Conjunctivae are normal. Pupils are equal, round, and reactive to light. No scleral icterus.  Neck: Normal range of motion.  Cardiovascular: Normal rate, regular rhythm and normal heart sounds.   Pulmonary/Chest: Effort normal. No respiratory distress. She has decreased breath sounds in the right lower field and the left lower field.  Abdominal: Soft. Bowel sounds are normal. There is no tenderness.  Musculoskeletal: Normal range of motion. She exhibits no edema.  Lymphadenopathy:    She has no cervical adenopathy.  Neurological: She is alert and oriented to person, place, and time. No cranial nerve deficit. Gait normal.  Skin: Skin is warm and dry. There is pallor.  Psychiatric: Mood, memory, affect and judgment normal.     LABORATORY DATA:  I have reviewed the labs as listed.  CBC    Component Value Date/Time   WBC 4.7 07/04/2016 1047   RBC 3.12 (L) 07/04/2016 1047   HGB 9.8 (L) 07/04/2016 1047   HCT 30.1 (L)  07/04/2016 1047   PLT 237 07/04/2016 1047   MCV 96.5 07/04/2016 1047   MCH 31.4 07/04/2016 1047   MCHC 32.6 07/04/2016 1047   RDW 16.6 (H) 07/04/2016 1047   LYMPHSABS 0.9 07/04/2016 1047  MONOABS 0.7 07/04/2016 1047   EOSABS 0.2 07/04/2016 1047   BASOSABS 0.1 07/04/2016 1047   CMP Latest Ref Rng & Units 06/13/2016 05/28/2016 05/15/2016  Glucose 65 - 99 mg/dL 117(H) 111(H) 114(H)  BUN 6 - 20 mg/dL '13 8 10  '$ Creatinine 0.44 - 1.00 mg/dL 0.96 0.98 0.82  Sodium 135 - 145 mmol/L 130(L) 132(L) 133(L)  Potassium 3.5 - 5.1 mmol/L 3.1(L) 4.0 4.3  Chloride 101 - 111 mmol/L 94(L) 101 102  CO2 22 - 32 mmol/L '26 24 27  '$ Calcium 8.9 - 10.3 mg/dL 8.8(L) 8.8(L) 8.7(L)  Total Protein 6.5 - 8.1 g/dL 6.7 6.0(L) 6.5  Total Bilirubin 0.3 - 1.2 mg/dL 1.0 0.6 0.4  Alkaline Phos 38 - 126 U/L 69 107 105  AST 15 - 41 U/L '17 19 16  '$ ALT 14 - 54 U/L 15 12(L) 12(L)   Results for Ashley, MARMO (MRN [id number])   Ref. Range 06/13/2016 12:10  B Natriuretic Peptide Latest Ref Range: 0.0 - 100.0 pg/mL 387.0 (H)   PENDING LABS:    DIAGNOSTIC IMAGING:  EKG: 07/04/16; notation of septal infart, age undetermined; normal sinus rhythm    PATHOLOGY:  Breast biopsy: 02/28/16       ASSESSMENT & PLAN:   Stage IIIA right breast cancer:  -s/p Adriamycin/Cytoxan x 4 cycles; cycle #4 delayed x 1 week and dose-reduced d/t thrombocytopenia.  Adriamycin discontinued from cycle #4 d/t concerns regarding cardiomyopathy/cardiotoxicity.  BNP elevated 387. ECHO completed and revealed EF 60-65%.  -Labs reviewed and are adequate for cycle #1 of Taxol.  Reviewed with her common side effects of Taxol, including hypersensitivity reaction, peripheral neuropathy (usually in later cycles), alopecia, & decreased blood counts.    Intermittent chest pain:  -She did complete 3 cycles of Adriamycin; cycle #4 of Adriamycin held d/t concerns for chemo-associated cardiotoxicity given her cough, shortness of breath, and elevated BNP.  ECHO  done on 06/14/16 revealed EF 60-65%.  -Reported periods of chest pain with activity concerning for cardiac etiology.  -EKG done today in clinic and showed "septal infarct, age undetermined." Normal sinus rhythm.  -Denies chest pain during her visit and treatment today.  -Placed urgent referral to cardiology for further evaluation; patient aware of this referral and I stressed the importance of having her chest pain further evaluated by a heart specialist. She agreed.   Concern for medication non-adherence due to polypharmacy:  -Ashley Doyle is a poor historian at times, particularly when it comes to her medications. She has difficulty remembering the indications for each of her scheduled and prn medications.  I will ask the nurses to create a med list for her with the indications for use of her current medications. She stated this would be helpful.   Anxiety/Nausea management -Encouraged patient to use for either her Ativan or Phenergan for nausea or anxiety, as needed. She does not need any refills.   Hypokalemia, resolved:  -Serum potassium normal today at 3.8.     Dispo:  -Return in 1 week for consideration for cycle #2 Taxol. -Return to cancer center for follow-up office visit prior to cycle #3 Taxol in 2 weeks.    All questions were answered to patient's stated satisfaction. Encouraged patient to call with any new concerns or questions before her next visit to the cancer center and we can certain see her sooner, if needed.     Plan of care discussed with Dr. Talbert Cage, who agrees with the above aforementioned.    Mike Craze, NP [facility]  [place] [phone]

## 2016-07-05 ENCOUNTER — Telehealth (HOSPITAL_COMMUNITY): Payer: Self-pay | Admitting: *Deleted

## 2016-07-05 NOTE — Telephone Encounter (Signed)
Called patient to follow-up after 1st taxol. Patient denies any complaints other than fatigue. Encouraged patient to call clinic as needed with issues/concerns. Verbalized understanding.

## 2016-07-10 ENCOUNTER — Inpatient Hospital Stay (HOSPITAL_COMMUNITY): Payer: Medicare Other

## 2016-07-11 ENCOUNTER — Encounter (HOSPITAL_COMMUNITY): Payer: Self-pay

## 2016-07-11 ENCOUNTER — Encounter (HOSPITAL_BASED_OUTPATIENT_CLINIC_OR_DEPARTMENT_OTHER): Payer: Medicare HMO

## 2016-07-11 VITALS — BP 132/58 | HR 85 | Temp 98.6°F | Resp 18 | Wt 162.4 lb

## 2016-07-11 DIAGNOSIS — Z9221 Personal history of antineoplastic chemotherapy: Secondary | ICD-10-CM | POA: Diagnosis not present

## 2016-07-11 DIAGNOSIS — C773 Secondary and unspecified malignant neoplasm of axilla and upper limb lymph nodes: Secondary | ICD-10-CM

## 2016-07-11 DIAGNOSIS — Z5111 Encounter for antineoplastic chemotherapy: Secondary | ICD-10-CM

## 2016-07-11 DIAGNOSIS — C50411 Malignant neoplasm of upper-outer quadrant of right female breast: Secondary | ICD-10-CM | POA: Diagnosis not present

## 2016-07-11 DIAGNOSIS — Z8673 Personal history of transient ischemic attack (TIA), and cerebral infarction without residual deficits: Secondary | ICD-10-CM | POA: Diagnosis not present

## 2016-07-11 DIAGNOSIS — C50911 Malignant neoplasm of unspecified site of right female breast: Secondary | ICD-10-CM

## 2016-07-11 DIAGNOSIS — Z8052 Family history of malignant neoplasm of bladder: Secondary | ICD-10-CM | POA: Diagnosis not present

## 2016-07-11 DIAGNOSIS — Z9889 Other specified postprocedural states: Secondary | ICD-10-CM | POA: Diagnosis not present

## 2016-07-11 DIAGNOSIS — E785 Hyperlipidemia, unspecified: Secondary | ICD-10-CM | POA: Diagnosis not present

## 2016-07-11 DIAGNOSIS — Z800 Family history of malignant neoplasm of digestive organs: Secondary | ICD-10-CM | POA: Diagnosis not present

## 2016-07-11 DIAGNOSIS — I10 Essential (primary) hypertension: Secondary | ICD-10-CM | POA: Diagnosis not present

## 2016-07-11 DIAGNOSIS — J449 Chronic obstructive pulmonary disease, unspecified: Secondary | ICD-10-CM | POA: Diagnosis not present

## 2016-07-11 LAB — COMPREHENSIVE METABOLIC PANEL
ALT: 25 U/L (ref 14–54)
AST: 29 U/L (ref 15–41)
Albumin: 3.8 g/dL (ref 3.5–5.0)
Alkaline Phosphatase: 82 U/L (ref 38–126)
Anion gap: 8 (ref 5–15)
BUN: 13 mg/dL (ref 6–20)
CO2: 24 mmol/L (ref 22–32)
Calcium: 8.9 mg/dL (ref 8.9–10.3)
Chloride: 98 mmol/L — ABNORMAL LOW (ref 101–111)
Creatinine, Ser: 0.92 mg/dL (ref 0.44–1.00)
GFR calc Af Amer: >60 mL/min (ref >60)
Glucose, Bld: 102 mg/dL — ABNORMAL HIGH (ref 65–99)
POTASSIUM: 4.1 mmol/L (ref 3.5–5.1)
SODIUM: 130 mmol/L — AB (ref 135–145)
Total Bilirubin: 0.7 mg/dL (ref 0.3–1.2)
Total Protein: 6.8 g/dL (ref 6.5–8.1)

## 2016-07-11 LAB — CBC WITH DIFFERENTIAL/PLATELET
BASOS ABS: 0 10*3/uL (ref 0.0–0.1)
BASOS PCT: 1 %
EOS ABS: 0.2 10*3/uL (ref 0.0–0.7)
EOS PCT: 3 %
HCT: 28.8 % — ABNORMAL LOW (ref 36.0–46.0)
Hemoglobin: 10.1 g/dL — ABNORMAL LOW (ref 12.0–15.0)
LYMPHS PCT: 24 %
Lymphs Abs: 1.1 10*3/uL (ref 0.7–4.0)
MCH: 32.6 pg (ref 26.0–34.0)
MCHC: 35.1 g/dL (ref 30.0–36.0)
MCV: 92.9 fL (ref 78.0–100.0)
MONO ABS: 0.5 10*3/uL (ref 0.1–1.0)
Monocytes Relative: 11 %
Neutro Abs: 2.9 10*3/uL (ref 1.7–7.7)
Neutrophils Relative %: 61 %
PLATELETS: 204 10*3/uL (ref 150–400)
RBC: 3.1 MIL/uL — AB (ref 3.87–5.11)
RDW: 15.9 % — ABNORMAL HIGH (ref 11.5–15.5)
WBC: 4.7 10*3/uL (ref 4.0–10.5)

## 2016-07-11 MED ORDER — DIPHENHYDRAMINE HCL 50 MG/ML IJ SOLN
50.0000 mg | Freq: Once | INTRAMUSCULAR | Status: AC
  Administered 2016-07-11: 50 mg via INTRAVENOUS

## 2016-07-11 MED ORDER — SODIUM CHLORIDE 0.9% FLUSH
10.0000 mL | INTRAVENOUS | Status: DC | PRN
  Administered 2016-07-11: 10 mL
  Filled 2016-07-11: qty 10

## 2016-07-11 MED ORDER — SODIUM CHLORIDE 0.9 % IV SOLN
20.0000 mg | Freq: Once | INTRAVENOUS | Status: AC
  Administered 2016-07-11: 20 mg via INTRAVENOUS
  Filled 2016-07-11: qty 2

## 2016-07-11 MED ORDER — HEPARIN SOD (PORK) LOCK FLUSH 100 UNIT/ML IV SOLN
500.0000 [IU] | Freq: Once | INTRAVENOUS | Status: AC | PRN
  Administered 2016-07-11: 500 [IU]
  Filled 2016-07-11 (×2): qty 5

## 2016-07-11 MED ORDER — PACLITAXEL CHEMO INJECTION 300 MG/50ML
80.0000 mg/m2 | Freq: Once | INTRAVENOUS | Status: AC
  Administered 2016-07-11: 144 mg via INTRAVENOUS
  Filled 2016-07-11: qty 24

## 2016-07-11 MED ORDER — DIPHENHYDRAMINE HCL 50 MG/ML IJ SOLN
INTRAMUSCULAR | Status: AC
  Filled 2016-07-11: qty 1

## 2016-07-11 MED ORDER — SODIUM CHLORIDE 0.9 % IV SOLN
Freq: Once | INTRAVENOUS | Status: AC
  Administered 2016-07-11: 14:00:00 via INTRAVENOUS

## 2016-07-11 MED ORDER — FAMOTIDINE IN NACL 20-0.9 MG/50ML-% IV SOLN
20.0000 mg | Freq: Once | INTRAVENOUS | Status: AC
  Administered 2016-07-11: 20 mg via INTRAVENOUS
  Filled 2016-07-11: qty 50

## 2016-07-11 NOTE — Progress Notes (Signed)
Candie Mile tolerated chemo tx well without complaints or incident. Labs reviewed with MD prior to administering chemotherapy. VSS upon discharge. Pt discharged self ambulatory in satisfactory condition accompanied by her husband

## 2016-07-11 NOTE — Patient Instructions (Signed)
[facility] Discharge Instructions for Patients Receiving Chemotherapy   Beginning January 23rd 2017 lab work for the Cancer Center will be done in the  Main lab at [facility] on 1st floor. If you have a lab appointment with the Cancer Center please come in thru the  Main Entrance and check in at the main information desk   Today you received the following chemotherapy agents Taxol. Follow-up as scheduled. Call clinic for any questions or concerns  To help prevent nausea and vomiting after your treatment, we encourage you to take your nausea medication   If you develop nausea and vomiting, or diarrhea that is not controlled by your medication, call the clinic.  The clinic phone number is [phone]. Office hours are Monday-Friday 8:30am-5:00pm.  BELOW ARE SYMPTOMS THAT SHOULD BE REPORTED IMMEDIATELY:  *FEVER GREATER THAN 101.0 F  *CHILLS WITH OR WITHOUT FEVER  NAUSEA AND VOMITING THAT IS NOT CONTROLLED WITH YOUR NAUSEA MEDICATION  *UNUSUAL SHORTNESS OF BREATH  *UNUSUAL BRUISING OR BLEEDING  TENDERNESS IN MOUTH AND THROAT WITH OR WITHOUT PRESENCE OF ULCERS  *URINARY PROBLEMS  *BOWEL PROBLEMS  UNUSUAL RASH Items with * indicate a potential emergency and should be followed up as soon as possible. If you have an emergency after office hours please contact your primary care physician or go to the nearest emergency department.  Please call the clinic during office hours if you have any questions or concerns.   You may also contact the Patient Navigator at [phone] should you have any questions or need assistance in obtaining follow up care.      Resources For Cancer Patients and their Caregivers ? American Cancer Society: Can assist with transportation, wigs, general needs, runs Look Good Feel Better.        1-888-227-6333 ? Cancer Care: Provides financial assistance, online support groups, medication/co-pay assistance.  1-800-813-HOPE  (4673) ? [facility] Assists [place] cancer patients and their families through emotional , educational and financial support.  336-427-4357 ? [place] DSS Where to apply for food stamps, Medicaid and utility assistance. 336-342-1394 ? RCATS: Transportation to medical appointments. 336-347-2287 ? Social Security Administration: May apply for disability if have a Stage IV cancer. 336-342-7796 1-800-772-1213 ? [place] Aging, Disability and Transit Services: Assists with nutrition, care and transit needs. 336-349-2343         

## 2016-07-16 ENCOUNTER — Other Ambulatory Visit (HOSPITAL_COMMUNITY): Payer: Self-pay | Admitting: Emergency Medicine

## 2016-07-16 MED ORDER — OXYCODONE-ACETAMINOPHEN 10-325 MG PO TABS: 1.0000 | ORAL_TABLET | ORAL | 0 refills | Status: DC | PRN

## 2016-07-16 NOTE — Progress Notes (Signed)
Oxycodone refilled.

## 2016-07-17 ENCOUNTER — Ambulatory Visit (HOSPITAL_COMMUNITY): Payer: Medicare Other | Admitting: Oncology

## 2016-07-17 ENCOUNTER — Inpatient Hospital Stay (HOSPITAL_COMMUNITY): Payer: Medicare Other

## 2016-07-18 ENCOUNTER — Inpatient Hospital Stay (HOSPITAL_COMMUNITY): Payer: Medicare HMO

## 2016-07-18 ENCOUNTER — Encounter (HOSPITAL_COMMUNITY): Payer: Medicare HMO | Attending: Oncology | Admitting: Adult Health

## 2016-07-18 ENCOUNTER — Encounter (HOSPITAL_COMMUNITY): Payer: Self-pay | Admitting: Adult Health

## 2016-07-18 ENCOUNTER — Encounter (HOSPITAL_BASED_OUTPATIENT_CLINIC_OR_DEPARTMENT_OTHER): Payer: Medicare HMO

## 2016-07-18 VITALS — BP 124/55 | HR 89 | Temp 98.1°F | Resp 16 | Ht 62.0 in | Wt 166.8 lb

## 2016-07-18 VITALS — BP 128/60 | HR 87 | Temp 97.7°F | Resp 18

## 2016-07-18 DIAGNOSIS — J449 Chronic obstructive pulmonary disease, unspecified: Secondary | ICD-10-CM | POA: Diagnosis not present

## 2016-07-18 DIAGNOSIS — Z8052 Family history of malignant neoplasm of bladder: Secondary | ICD-10-CM | POA: Diagnosis not present

## 2016-07-18 DIAGNOSIS — Z9889 Other specified postprocedural states: Secondary | ICD-10-CM | POA: Diagnosis not present

## 2016-07-18 DIAGNOSIS — Z8673 Personal history of transient ischemic attack (TIA), and cerebral infarction without residual deficits: Secondary | ICD-10-CM | POA: Insufficient documentation

## 2016-07-18 DIAGNOSIS — E785 Hyperlipidemia, unspecified: Secondary | ICD-10-CM | POA: Diagnosis not present

## 2016-07-18 DIAGNOSIS — D649 Anemia, unspecified: Secondary | ICD-10-CM | POA: Diagnosis not present

## 2016-07-18 DIAGNOSIS — R079 Chest pain, unspecified: Secondary | ICD-10-CM | POA: Diagnosis not present

## 2016-07-18 DIAGNOSIS — C50911 Malignant neoplasm of unspecified site of right female breast: Secondary | ICD-10-CM

## 2016-07-18 DIAGNOSIS — Z170 Estrogen receptor positive status [ER+]: Secondary | ICD-10-CM | POA: Diagnosis not present

## 2016-07-18 DIAGNOSIS — F411 Generalized anxiety disorder: Secondary | ICD-10-CM

## 2016-07-18 DIAGNOSIS — I10 Essential (primary) hypertension: Secondary | ICD-10-CM | POA: Insufficient documentation

## 2016-07-18 DIAGNOSIS — C50411 Malignant neoplasm of upper-outer quadrant of right female breast: Secondary | ICD-10-CM | POA: Diagnosis not present

## 2016-07-18 DIAGNOSIS — Z5111 Encounter for antineoplastic chemotherapy: Secondary | ICD-10-CM | POA: Diagnosis not present

## 2016-07-18 DIAGNOSIS — K5900 Constipation, unspecified: Secondary | ICD-10-CM | POA: Diagnosis not present

## 2016-07-18 DIAGNOSIS — Z9221 Personal history of antineoplastic chemotherapy: Secondary | ICD-10-CM | POA: Diagnosis not present

## 2016-07-18 DIAGNOSIS — Z800 Family history of malignant neoplasm of digestive organs: Secondary | ICD-10-CM | POA: Diagnosis not present

## 2016-07-18 DIAGNOSIS — F17210 Nicotine dependence, cigarettes, uncomplicated: Secondary | ICD-10-CM | POA: Insufficient documentation

## 2016-07-18 DIAGNOSIS — C773 Secondary and unspecified malignant neoplasm of axilla and upper limb lymph nodes: Secondary | ICD-10-CM

## 2016-07-18 DIAGNOSIS — R197 Diarrhea, unspecified: Secondary | ICD-10-CM

## 2016-07-18 LAB — COMPREHENSIVE METABOLIC PANEL
ALT: 19 U/L (ref 14–54)
AST: 26 U/L (ref 15–41)
Albumin: 3.7 g/dL (ref 3.5–5.0)
Alkaline Phosphatase: 75 U/L (ref 38–126)
Anion gap: 6 (ref 5–15)
BUN: 9 mg/dL (ref 6–20)
CHLORIDE: 101 mmol/L (ref 101–111)
CO2: 24 mmol/L (ref 22–32)
Calcium: 8.9 mg/dL (ref 8.9–10.3)
Creatinine, Ser: 0.87 mg/dL (ref 0.44–1.00)
Glucose, Bld: 97 mg/dL (ref 65–99)
POTASSIUM: 4 mmol/L (ref 3.5–5.1)
SODIUM: 131 mmol/L — AB (ref 135–145)
Total Bilirubin: 0.8 mg/dL (ref 0.3–1.2)
Total Protein: 6.4 g/dL — ABNORMAL LOW (ref 6.5–8.1)

## 2016-07-18 LAB — CBC WITH DIFFERENTIAL/PLATELET
BASOS ABS: 0 10*3/uL (ref 0.0–0.1)
Basophils Relative: 1 %
EOS ABS: 0.2 10*3/uL (ref 0.0–0.7)
EOS PCT: 4 %
HCT: 27.7 % — ABNORMAL LOW (ref 36.0–46.0)
Hemoglobin: 9.5 g/dL — ABNORMAL LOW (ref 12.0–15.0)
LYMPHS PCT: 22 %
Lymphs Abs: 0.8 10*3/uL (ref 0.7–4.0)
MCH: 32.6 pg (ref 26.0–34.0)
MCHC: 34.3 g/dL (ref 30.0–36.0)
MCV: 95.2 fL (ref 78.0–100.0)
MONO ABS: 0.3 10*3/uL (ref 0.1–1.0)
Monocytes Relative: 7 %
Neutro Abs: 2.6 10*3/uL (ref 1.7–7.7)
Neutrophils Relative %: 66 %
PLATELETS: 223 10*3/uL (ref 150–400)
RBC: 2.91 MIL/uL — AB (ref 3.87–5.11)
RDW: 17.3 % — AB (ref 11.5–15.5)
WBC: 3.9 10*3/uL — AB (ref 4.0–10.5)

## 2016-07-18 MED ORDER — SODIUM CHLORIDE 0.9% FLUSH
10.0000 mL | INTRAVENOUS | Status: DC | PRN
  Administered 2016-07-18: 10 mL
  Filled 2016-07-18: qty 10

## 2016-07-18 MED ORDER — FAMOTIDINE IN NACL 20-0.9 MG/50ML-% IV SOLN
20.0000 mg | Freq: Once | INTRAVENOUS | Status: AC
  Administered 2016-07-18: 20 mg via INTRAVENOUS

## 2016-07-18 MED ORDER — DIPHENHYDRAMINE HCL 50 MG/ML IJ SOLN
50.0000 mg | Freq: Once | INTRAMUSCULAR | Status: AC
  Administered 2016-07-18: 50 mg via INTRAVENOUS

## 2016-07-18 MED ORDER — HEPARIN SOD (PORK) LOCK FLUSH 100 UNIT/ML IV SOLN
500.0000 [IU] | Freq: Once | INTRAVENOUS | Status: AC | PRN
  Administered 2016-07-18: 500 [IU]
  Filled 2016-07-18: qty 5

## 2016-07-18 MED ORDER — DIPHENHYDRAMINE HCL 50 MG/ML IJ SOLN
INTRAMUSCULAR | Status: AC
  Filled 2016-07-18: qty 1

## 2016-07-18 MED ORDER — PACLITAXEL CHEMO INJECTION 300 MG/50ML
80.0000 mg/m2 | Freq: Once | INTRAVENOUS | Status: AC
  Administered 2016-07-18: 144 mg via INTRAVENOUS
  Filled 2016-07-18: qty 24

## 2016-07-18 MED ORDER — FAMOTIDINE IN NACL 20-0.9 MG/50ML-% IV SOLN
INTRAVENOUS | Status: AC
  Filled 2016-07-18: qty 50

## 2016-07-18 MED ORDER — SODIUM CHLORIDE 0.9 % IV SOLN
Freq: Once | INTRAVENOUS | Status: AC
  Administered 2016-07-18: 14:00:00 via INTRAVENOUS

## 2016-07-18 MED ORDER — DEXAMETHASONE SODIUM PHOSPHATE 100 MG/10ML IJ SOLN
20.0000 mg | Freq: Once | INTRAMUSCULAR | Status: AC
  Administered 2016-07-18: 20 mg via INTRAVENOUS
  Filled 2016-07-18: qty 2

## 2016-07-18 NOTE — Patient Instructions (Signed)
[facility] at [facility] Discharge Instructions  RECOMMENDATIONS MADE BY THE CONSULTANT AND ANY TEST RESULTS WILL BE SENT TO YOUR REFERRING PHYSICIAN.  You were seen today by Mike Craze NP. Try taking Miralax for constipation. Return in about 2 weeks for follow up. Return as scheduled for treatments .   Thank you for choosing [facility] at [facility] to provide your oncology and hematology care.  To afford each patient quality time with our provider, please arrive at least 15 minutes before your scheduled appointment time.    If you have a lab appointment with the [facility] please come in thru the  Main Entrance and check in at the main information desk  You need to re-schedule your appointment should you arrive 10 or more minutes late.  We strive to give you quality time with our providers, and arriving late affects you and other patients whose appointments are after yours.  Also, if you no show three or more times for appointments you may be dismissed from the clinic at the providers discretion.     Again, thank you for choosing [facility].  Our hope is that these requests will decrease the amount of time that you wait before being seen by our physicians.       _____________________________________________________________  Should you have questions after your visit to [facility], please contact our office at [phone] between the hours of 8:30 a.m. and 4:30 p.m.  Voicemails left after 4:30 p.m. will not be returned until the following business day.  For prescription refill requests, have your pharmacy contact our office.       Resources For Cancer Patients and their Caregivers ? American Cancer Society: Can assist with transportation, wigs, general needs, runs Look Good Feel Better.        5673955710 ? Cancer Care: Provides financial assistance, online support groups, medication/co-pay  assistance.  1-800-813-HOPE (805)006-0990) ? [facility] Assists [place] cancer patients and their families through emotional , educational and financial support.  (619)098-1754 ? [place] DSS Where to apply for food stamps, Medicaid and utility assistance. 325-867-3367 ? RCATS: Transportation to medical appointments. 361 299 4496 ? Social Security Administration: May apply for disability if have a Stage IV cancer. 913-015-3470 810-213-9967 ? [employer], Disability and Transit Services: Assists with nutrition, care and transit needs. [place] Support Programs: @10RELATIVEDAYS @ > Cancer Support Group  2nd Tuesday of the month 1pm-2pm, Journey Room  > Creative Journey  3rd Tuesday of the month 1130am-1pm, Journey Room  > Look Good Feel Better  1st Wednesday of the month 10am-12 noon, Journey Room (Call [facility] to register [phone])

## 2016-07-18 NOTE — Patient Instructions (Signed)
[facility] Discharge Instructions for Patients Receiving Chemotherapy   Beginning January 23rd 2017 lab work for the Cancer Center will be done in the  Main lab at [facility] on 1st floor. If you have a lab appointment with the Cancer Center please come in thru the  Main Entrance and check in at the main information desk   Today you received the following chemotherapy agents Taxol. Follow-up as scheduled. Call clinic for any questions or concerns  To help prevent nausea and vomiting after your treatment, we encourage you to take your nausea medication   If you develop nausea and vomiting, or diarrhea that is not controlled by your medication, call the clinic.  The clinic phone number is [phone]. Office hours are Monday-Friday 8:30am-5:00pm.  BELOW ARE SYMPTOMS THAT SHOULD BE REPORTED IMMEDIATELY:  *FEVER GREATER THAN 101.0 F  *CHILLS WITH OR WITHOUT FEVER  NAUSEA AND VOMITING THAT IS NOT CONTROLLED WITH YOUR NAUSEA MEDICATION  *UNUSUAL SHORTNESS OF BREATH  *UNUSUAL BRUISING OR BLEEDING  TENDERNESS IN MOUTH AND THROAT WITH OR WITHOUT PRESENCE OF ULCERS  *URINARY PROBLEMS  *BOWEL PROBLEMS  UNUSUAL RASH Items with * indicate a potential emergency and should be followed up as soon as possible. If you have an emergency after office hours please contact your primary care physician or go to the nearest emergency department.  Please call the clinic during office hours if you have any questions or concerns.   You may also contact the Patient Navigator at [phone] should you have any questions or need assistance in obtaining follow up care.      Resources For Cancer Patients and their Caregivers ? American Cancer Society: Can assist with transportation, wigs, general needs, runs Look Good Feel Better.        1-888-227-6333 ? Cancer Care: Provides financial assistance, online support groups, medication/co-pay assistance.  1-800-813-HOPE  (4673) ? [facility] Assists [place] cancer patients and their families through emotional , educational and financial support.  336-427-4357 ? [place] DSS Where to apply for food stamps, Medicaid and utility assistance. 336-342-1394 ? RCATS: Transportation to medical appointments. 336-347-2287 ? Social Security Administration: May apply for disability if have a Stage IV cancer. 336-342-7796 1-800-772-1213 ? [place] Aging, Disability and Transit Services: Assists with nutrition, care and transit needs. 336-349-2343         

## 2016-07-18 NOTE — Progress Notes (Signed)
[facility] [place], [place] [address]   CLINIC:  Medical Oncology/Hematology  PCP:  Wende Neighbors, MD [place] [address] [phone]   REASON FOR VISIT:  Follow-up for Stage IIIA right breast invasive ductal carcinoma, ER+/PR+/HER2-  CURRENT THERAPY: Neoadjuvant chemo; currently weekly Taxol     BRIEF ONCOLOGIC HISTORY:    Invasive ductal carcinoma of breast, female, right ([place])   02/21/2016 Mammogram    Irregular vascular mass in the right breast at 10 o'clock, 10 cm the nipple, measuring 2.9 x 2.5 x 2.6 cm. In the right axilla there is 3 discrete enlarged abnormal appearing lymph nodes. Largest, which lies lateral to the palpable mass, measures 1.9 x 1.3 x 1.9 cm.      02/28/2016 Procedure    Right needle core biopsy in 10:00 position and right axillary biopsy      02/28/2016 Procedure    Postprocedure mammogram for clip placement. Ribbon shaped clip well-positioned within the biopsied mass in the right breast at the 10 o'clock axis. Wing shaped clip is well-positioned within the biopsied lymph node as seen on postprocedure ultrasound evaluation.  Final Assessment: Post Procedure Mammograms for Marker Placement      03/01/2016 Pathology Results    Invasive ductal carcinoma with extracellular mucin of right breast mass and ductal carcinoma of right axillary lymph node.  Grade 2.  ER 100%, PR 100%, Ki-67 12%, HER2 NEGATIVE.      03/12/2016 Imaging    CT CAP- Right breast cancer with associated right axillary metastatic lymph nodes. 2. No evidence of distant metastatic disease. 3. Hepatic steatosis.      03/26/2016 Breast MRI    2.7 cm spiculated mass in the upper-outer quadrant of right breast corresponding with the invasive ductal carcinoma. Masses in the right axilla consistent with axillary adenopathy.      04/16/2016 -  Chemotherapy    The patient had DOXOrubicin (ADRIAMYCIN) chemo injection 116 mg, 60  mg/m2 = 116 mg, Intravenous,  Once, 3 of 4 cycles  palonosetron (ALOXI) injection 0.25 mg, 0.25 mg, Intravenous,  Once, 3 of 4 cycles  pegfilgrastim (NEULASTA ONPRO KIT) injection 6 mg, 6 mg, Subcutaneous, Once, 3 of 4 cycles  cyclophosphamide (CYTOXAN) 1,160 mg in sodium chloride 0.9 % 250 mL chemo infusion, 600 mg/m2 = 1,160 mg, Intravenous,  Once, 3 of 4 cycles  PACLitaxel (TAXOL) 156 mg in dextrose 5 % 250 mL chemo infusion ( fosaprepitant (EMEND) 150 mg, dexamethasone (DECADRON) 12 mg in sodium chloride 0.9 % 145 mL IVPB, , Intravenous,  Once, 3 of 4 cycles  for chemotherapy treatment.        05/28/2016 Adverse Reaction    Thrombocytopenia      05/28/2016 Treatment Plan Change    AC #4 delayed x 1 week      06/13/2016 Treatment Plan Change    Adriamycin d/c'd for cycle #4 due to concerns for cardiomyopathy/fluid volume overload.       06/14/2016 Echocardiogram    EF 60-65%       INTERVAL HISTORY:  Ashley Doyle is here for follow-up and continued weekly Taxol chemotherapy.    Her appetite is improving; reports "low energy and at times I feel really weak."  States that she has periods of "trembling"; she takes an Ativan and it subsides.  She has diarrhea for about 2 days after chemotherapy, "but then I'm constipated the rest of the time; I take a Dulcolax and that helps."  Denies peripheral neuropathy. Endorses "achy bones" to her feet, which started with her Adriamycin/Cytoxan chemotherapy.  Otherwise, she is largely without complains today.    She is due for cycle #4 Taxol today.    She has cardiology consultation scheduled for tomorrow, 07/19/16.     REVIEW OF SYSTEMS:  Review of Systems  Constitutional: Positive for fatigue. Negative for chills and fever.  HENT:  Negative.   Eyes: Negative.   Respiratory: Negative for cough and shortness of breath.   Gastrointestinal: Positive for diarrhea. Negative for abdominal pain, blood in stool, constipation, nausea and  vomiting.  Endocrine: Negative.   Genitourinary: Negative.  Negative for dysuria, hematuria and vaginal bleeding.   Musculoskeletal: Positive for arthralgias.  Skin: Negative.   Neurological: Positive for headaches (chronic headaches).  Hematological: Negative.   Psychiatric/Behavioral: The patient is nervous/anxious.      PAST MEDICAL/SURGICAL HISTORY:  Past Medical History:  Diagnosis Date  . Anxiety   . Arthritis   . Asthma   . Breast cancer ([place])   . COPD (chronic obstructive pulmonary disease) ([place])   . Depression   . Emphysema of lung ([place])   . Family history of adverse reaction to anesthesia    sister has PONV  . GERD (gastroesophageal reflux disease)   . HOH (hard of hearing)   . Hypertension   . Invasive ductal carcinoma of breast, female, right ([facility]) 03/07/2016  . Pinched nerve in neck    and back  . Stroke ([place])   . Tuberculosis    "several years ago" 04/11/16  . Wears glasses   . Wears hearing aid    left ear   Past Surgical History:  Procedure Laterality Date  . ABDOMINAL HYSTERECTOMY    . APPENDECTOMY    . INNER EAR SURGERY Right   . PORTACATH PLACEMENT N/A 04/12/2016   Procedure: INSERTION PORT-A-CATH WITH [place];  Surgeon: Rolm Bookbinder, MD;  Location: [facility];  Service: General;  Laterality: N/A;      SOCIAL HISTORY:  Social History   Social History  . Marital status: Married    Spouse name: N/A  . Number of children: N/A  . Years of education: N/A   Occupational History  . Not on file.   Social History Main Topics  . Smoking status: Current Every Day Smoker    Packs/day: 2.00    Years: 54.00    Types: Cigarettes  . Smokeless tobacco: Never Used  . Alcohol use No  . Drug use: No  . Sexual activity: Not on file     Comment: hysterectomy   Other Topics Concern  . Not on file   Social History Narrative  . No narrative on file    FAMILY HISTORY:  Family History  Problem Relation Age of Onset  . Stroke Mother   . Heart disease  Mother   . Lung cancer Father     CURRENT MEDICATIONS:  Outpatient Encounter Prescriptions as of 07/18/2016  Medication Sig  . citalopram (CELEXA) 40 MG tablet Take 40 mg by mouth daily.  Marland Kitchen dexamethasone (DECADRON) 4 MG tablet Take 2 tablets by mouth once a day on the day after chemotherapy and then take 2 tablets two times a day for 2 days. Take with food.  Marland Kitchen HYDROcodone-acetaminophen (NORCO/VICODIN) 5-325 MG tablet Take 1 tablet by mouth 3 times daily as needed for pain  . lidocaine-prilocaine (EMLA) cream Apply to affected area once  . LORazepam (ATIVAN) 0.5 MG tablet Take 1 tablet (0.5 mg total) by  mouth every 8 (eight) hours.  Marland Kitchen omeprazole (PRILOSEC) 20 MG capsule Take 1 capsule (20 mg total) by mouth daily.  . ondansetron (ZOFRAN) 8 MG tablet Take 1 tablet (8 mg total) by mouth every 8 (eight) hours as needed. Start on the third day after chemotherapy.  Marland Kitchen oxyCODONE-acetaminophen (PERCOCET) 10-325 MG tablet Take 1 tablet by mouth every 4 (four) hours as needed for pain.  . potassium chloride SA (K-DUR,KLOR-CON) 20 MEQ tablet Take 2 tablets (40 mEq total) by mouth 2 (two) times daily.  . prochlorperazine (COMPAZINE) 10 MG tablet Take 1 tablet (10 mg total) by mouth every 6 (six) hours as needed (Nausea or vomiting).  . promethazine (PHENERGAN) 25 MG tablet Take 0.5 tablets (12.5 mg total) by mouth every 6 (six) hours as needed for nausea or vomiting.  . triamterene-hydrochlorothiazide (MAXZIDE-25) 37.5-25 MG tablet Take 1 tablet by mouth daily.  Marland Kitchen HYDROcodone-homatropine (HYCODAN) 5-1.5 MG/5ML syrup Take 5 mLs by mouth every 6 (six) hours as needed for cough. (Patient not taking: Reported on 07/18/2016)   No facility-administered encounter medications on file as of 07/18/2016.     ALLERGIES:  No Known Allergies   PHYSICAL EXAM:  ECOG Performance status: 2, Symptomatic <50% time spent in bed.   Physical Exam  Constitutional: She is oriented to person, place, and time and well-developed,  well-nourished, and in no distress.  She is seen/examined in treatment chair today.   HENT:  Head: Normocephalic.  Mouth/Throat: Oropharynx is clear and moist. No oropharyngeal exudate.  Hard of hearing; hearing aid in place  Eyes: Conjunctivae are normal. No scleral icterus.  Neck: Normal range of motion. Neck supple.  Cardiovascular: Normal rate, regular rhythm and normal heart sounds.   Pulmonary/Chest: Effort normal. No respiratory distress. She has decreased breath sounds in the right lower field and the left lower field.  Abdominal: Soft. Bowel sounds are normal. There is no tenderness.  Musculoskeletal: Normal range of motion. She exhibits no edema.  Lymphadenopathy:    She has no cervical adenopathy.  Neurological: She is alert and oriented to person, place, and time. No cranial nerve deficit. Gait normal.  Skin: Skin is warm and dry. There is pallor.  Psychiatric: Mood, memory, affect and judgment normal.     LABORATORY DATA:  I have reviewed the labs as listed.  CBC    Component Value Date/Time   WBC 4.7 07/11/2016 1316   RBC 3.10 (L) 07/11/2016 1316   HGB 10.1 (L) 07/11/2016 1316   HCT 28.8 (L) 07/11/2016 1316   PLT 204 07/11/2016 1316   MCV 92.9 07/11/2016 1316   MCH 32.6 07/11/2016 1316   MCHC 35.1 07/11/2016 1316   RDW 15.9 (H) 07/11/2016 1316   LYMPHSABS 1.1 07/11/2016 1316   MONOABS 0.5 07/11/2016 1316   EOSABS 0.2 07/11/2016 1316   BASOSABS 0.0 07/11/2016 1316   CMP Latest Ref Rng & Units 07/11/2016 07/04/2016 06/13/2016  Glucose 65 - 99 mg/dL 102(H) 102(H) 117(H)  BUN 6 - 20 mg/dL '13 12 13  ' Creatinine 0.44 - 1.00 mg/dL 0.92 0.98 0.96  Sodium 135 - 145 mmol/L 130(L) 133(L) 130(L)  Potassium 3.5 - 5.1 mmol/L 4.1 3.8 3.1(L)  Chloride 101 - 111 mmol/L 98(L) 101 94(L)  CO2 22 - 32 mmol/L '24 24 26  ' Calcium 8.9 - 10.3 mg/dL 8.9 9.0 8.8(L)  Total Protein 6.5 - 8.1 g/dL 6.8 6.6 6.7  Total Bilirubin 0.3 - 1.2 mg/dL 0.7 0.8 1.0  Alkaline Phos 38 - 126 U/L 82 71  69  AST 15 - 41 U/L '29 27 17  ' ALT 14 - 54 U/L '25 14 15   ' Results for Ashley Doyle, Ashley Doyle (MRN [id number])   Ref. Range 06/13/2016 12:10  B Natriuretic Peptide Latest Ref Range: 0.0 - 100.0 pg/mL 387.0 (H)   PENDING LABS:    DIAGNOSTIC IMAGING:  EKG: 07/04/16; notation of septal infart, age undetermined; normal sinus rhythm    PATHOLOGY:  Breast biopsy: 02/28/16       ASSESSMENT & PLAN:   Stage IIIA right breast cancer:  -s/p Adriamycin/Cytoxan x 4 cycles; cycle #4 delayed x 1 week and dose-reduced d/t thrombocytopenia.  Adriamycin discontinued from cycle #4 d/t concerns regarding cardiomyopathy/cardiotoxicity.  BNP elevated 387. ECHO completed and revealed EF 60-65%.  -Labs reviewed and are adequate for cycle #4 of Taxol.  She is tolerating therapy well.  -Return weekly for continued Taxol therapy; we will see her for follow-up appt in 2 weeks.    Anemia:  -Hemoglobin 9.5 today; likely secondary to chemotherapy. Will continue to monitor.   Intermittent chest pain:  -She did complete 3 cycles of Adriamycin; cycle #4 of Adriamycin held d/t concerns for chemo-associated cardiotoxicity given her cough, shortness of breath, and elevated BNP.  ECHO done on 06/14/16 revealed EF 60-65%.  -Reported periods of chest pain with activity concerning for cardiac etiology.  -EKG done recently in clinic and showed "septal infarct, age undetermined." Normal sinus rhythm.  -Denies chest pain during her visit and treatment today.  -She has cardiology consultation scheduled for tomorrow, 07/19/16.  Stressed the importance of going to this visit.   Anxiety:  -Continue Ativan as needed.   Diarrhea followed by constipation:  -Diarrhea likely secondary to chemotherapy; self-limiting; could take Imodium OTC prn if needed as well.  -Recommended she use Miralax for constipation as a more gentle daily laxative, when needed.      Dispo:  -Return weekly for Taxol.  -Follow-up visit in 2 weeks to continue  to monitor chemo toxicity.     All questions were answered to patient's stated satisfaction. Encouraged patient to call with any new concerns or questions before her next visit to the cancer center and we can certain see her sooner, if needed.     Plan of care discussed with Dr. Talbert Cage, who agrees with the above aforementioned.    Mike Craze, NP [facility] [phone]

## 2016-07-18 NOTE — Progress Notes (Signed)
Candie Mile tolerated chemo tx well without complaints or incident. Labs reviewed prior to administering chemotherapy. VSS upon discharge. Pt discharged self ambulatory in satisfactory condition accompanied by her husband

## 2016-07-19 ENCOUNTER — Ambulatory Visit: Payer: Medicare HMO | Admitting: Cardiovascular Disease

## 2016-07-19 ENCOUNTER — Encounter: Payer: Self-pay | Admitting: Cardiovascular Disease

## 2016-07-25 ENCOUNTER — Telehealth (HOSPITAL_COMMUNITY): Payer: Self-pay | Admitting: Emergency Medicine

## 2016-07-25 ENCOUNTER — Inpatient Hospital Stay (HOSPITAL_COMMUNITY): Payer: Medicare HMO

## 2016-07-25 NOTE — Telephone Encounter (Signed)
Called husband and they were willing to come in for chemo on Friday.  We are going to adjust the schedule.  Told them to stop by and get a new schedule on Friday.

## 2016-07-25 NOTE — Telephone Encounter (Signed)
Pts husband called and said that he needed to reschedule her chemo.  Pt is not having any fevers, vomiting, or diarrhea.  Pt just over-done it yesterday working in the house.  She has ben up since 4 am and now is just completely tired.  Spoke with Mike Craze and she said she could come in Friday but it would put all her future treatments on Friday, or she could skip this week and keep her scheduled appt for next week.  Husband wants to just come in next Wednesday as scheduled.  Apppt for chemo canceled today.

## 2016-07-27 ENCOUNTER — Telehealth (HOSPITAL_COMMUNITY): Payer: Self-pay | Admitting: Emergency Medicine

## 2016-07-27 ENCOUNTER — Inpatient Hospital Stay (HOSPITAL_COMMUNITY): Payer: Medicare HMO

## 2016-07-27 NOTE — Telephone Encounter (Signed)
Pts husband called and stated that wife said she was not going to come in until next Wednesday.  I asked what was wrong and he said she didn't want to come in.  He told her not to worry about it.  I explained that she is curable and that missing treatments like this is not good.  He said that he understands but he can not make her come.  I will re-schedule the pt again.

## 2016-08-01 ENCOUNTER — Other Ambulatory Visit (HOSPITAL_COMMUNITY): Payer: Self-pay | Admitting: Adult Health

## 2016-08-01 ENCOUNTER — Encounter (HOSPITAL_BASED_OUTPATIENT_CLINIC_OR_DEPARTMENT_OTHER): Payer: Medicare HMO

## 2016-08-01 ENCOUNTER — Inpatient Hospital Stay (HOSPITAL_COMMUNITY): Payer: Medicare HMO

## 2016-08-01 ENCOUNTER — Ambulatory Visit (HOSPITAL_COMMUNITY): Payer: Medicare HMO | Admitting: Oncology

## 2016-08-01 ENCOUNTER — Encounter (HOSPITAL_COMMUNITY): Payer: Self-pay

## 2016-08-01 VITALS — BP 140/68 | HR 93 | Temp 97.8°F | Resp 18 | Wt 166.0 lb

## 2016-08-01 DIAGNOSIS — J449 Chronic obstructive pulmonary disease, unspecified: Secondary | ICD-10-CM | POA: Diagnosis not present

## 2016-08-01 DIAGNOSIS — Z9889 Other specified postprocedural states: Secondary | ICD-10-CM | POA: Diagnosis not present

## 2016-08-01 DIAGNOSIS — Z5111 Encounter for antineoplastic chemotherapy: Secondary | ICD-10-CM | POA: Diagnosis not present

## 2016-08-01 DIAGNOSIS — F419 Anxiety disorder, unspecified: Secondary | ICD-10-CM

## 2016-08-01 DIAGNOSIS — E785 Hyperlipidemia, unspecified: Secondary | ICD-10-CM | POA: Diagnosis not present

## 2016-08-01 DIAGNOSIS — Z8052 Family history of malignant neoplasm of bladder: Secondary | ICD-10-CM | POA: Diagnosis not present

## 2016-08-01 DIAGNOSIS — C50911 Malignant neoplasm of unspecified site of right female breast: Secondary | ICD-10-CM

## 2016-08-01 DIAGNOSIS — Z800 Family history of malignant neoplasm of digestive organs: Secondary | ICD-10-CM | POA: Diagnosis not present

## 2016-08-01 DIAGNOSIS — C773 Secondary and unspecified malignant neoplasm of axilla and upper limb lymph nodes: Secondary | ICD-10-CM

## 2016-08-01 DIAGNOSIS — I10 Essential (primary) hypertension: Secondary | ICD-10-CM | POA: Diagnosis not present

## 2016-08-01 DIAGNOSIS — Z8673 Personal history of transient ischemic attack (TIA), and cerebral infarction without residual deficits: Secondary | ICD-10-CM | POA: Diagnosis not present

## 2016-08-01 DIAGNOSIS — C50411 Malignant neoplasm of upper-outer quadrant of right female breast: Secondary | ICD-10-CM

## 2016-08-01 DIAGNOSIS — Z9221 Personal history of antineoplastic chemotherapy: Secondary | ICD-10-CM | POA: Diagnosis not present

## 2016-08-01 LAB — CBC WITH DIFFERENTIAL/PLATELET
BASOS ABS: 0 10*3/uL (ref 0.0–0.1)
Basophils Relative: 1 %
EOS PCT: 2 %
Eosinophils Absolute: 0.1 10*3/uL (ref 0.0–0.7)
HEMATOCRIT: 31.8 % — AB (ref 36.0–46.0)
Hemoglobin: 11.1 g/dL — ABNORMAL LOW (ref 12.0–15.0)
LYMPHS ABS: 1 10*3/uL (ref 0.7–4.0)
LYMPHS PCT: 17 %
MCH: 33.2 pg (ref 26.0–34.0)
MCHC: 34.9 g/dL (ref 30.0–36.0)
MCV: 95.2 fL (ref 78.0–100.0)
MONO ABS: 0.8 10*3/uL (ref 0.1–1.0)
Monocytes Relative: 14 %
NEUTROS ABS: 3.8 10*3/uL (ref 1.7–7.7)
Neutrophils Relative %: 66 %
Platelets: 222 10*3/uL (ref 150–400)
RBC: 3.34 MIL/uL — ABNORMAL LOW (ref 3.87–5.11)
RDW: 18.4 % — AB (ref 11.5–15.5)
WBC: 5.8 10*3/uL (ref 4.0–10.5)

## 2016-08-01 LAB — COMPREHENSIVE METABOLIC PANEL
ALT: 19 U/L (ref 14–54)
AST: 29 U/L (ref 15–41)
Albumin: 3.9 g/dL (ref 3.5–5.0)
Alkaline Phosphatase: 86 U/L (ref 38–126)
Anion gap: 9 (ref 5–15)
BUN: 14 mg/dL (ref 6–20)
CHLORIDE: 98 mmol/L — AB (ref 101–111)
CO2: 24 mmol/L (ref 22–32)
Calcium: 9.3 mg/dL (ref 8.9–10.3)
Creatinine, Ser: 0.89 mg/dL (ref 0.44–1.00)
Glucose, Bld: 142 mg/dL — ABNORMAL HIGH (ref 65–99)
POTASSIUM: 3.6 mmol/L (ref 3.5–5.1)
Sodium: 131 mmol/L — ABNORMAL LOW (ref 135–145)
Total Bilirubin: 0.5 mg/dL (ref 0.3–1.2)
Total Protein: 6.7 g/dL (ref 6.5–8.1)

## 2016-08-01 MED ORDER — FAMOTIDINE IN NACL 20-0.9 MG/50ML-% IV SOLN
20.0000 mg | Freq: Once | INTRAVENOUS | Status: AC
Start: 2016-08-01 — End: 2016-08-01
  Administered 2016-08-01: 20 mg via INTRAVENOUS
  Filled 2016-08-01: qty 50

## 2016-08-01 MED ORDER — LORAZEPAM 0.5 MG PO TABS: 0.5000 mg | ORAL_TABLET | Freq: Three times a day (TID) | ORAL | 0 refills | Status: DC

## 2016-08-01 MED ORDER — DIPHENHYDRAMINE HCL 50 MG/ML IJ SOLN
50.0000 mg | Freq: Once | INTRAMUSCULAR | Status: AC
  Administered 2016-08-01: 50 mg via INTRAVENOUS
  Filled 2016-08-01: qty 1

## 2016-08-01 MED ORDER — HEPARIN SOD (PORK) LOCK FLUSH 100 UNIT/ML IV SOLN
500.0000 [IU] | Freq: Once | INTRAVENOUS | Status: AC | PRN
  Administered 2016-08-01: 500 [IU]
  Filled 2016-08-01 (×2): qty 5

## 2016-08-01 MED ORDER — SODIUM CHLORIDE 0.9 % IV SOLN
Freq: Once | INTRAVENOUS | Status: AC
  Administered 2016-08-01: 13:00:00 via INTRAVENOUS

## 2016-08-01 MED ORDER — SODIUM CHLORIDE 0.9 % IV SOLN
20.0000 mg | Freq: Once | INTRAVENOUS | Status: AC
  Administered 2016-08-01: 20 mg via INTRAVENOUS
  Filled 2016-08-01: qty 2

## 2016-08-01 MED ORDER — PACLITAXEL CHEMO INJECTION 300 MG/50ML
80.0000 mg/m2 | Freq: Once | INTRAVENOUS | Status: AC
  Administered 2016-08-01: 144 mg via INTRAVENOUS
  Filled 2016-08-01: qty 24

## 2016-08-01 NOTE — Progress Notes (Signed)
Patient requesting refill of Ativan 0.5 mg.   [place] Controlled Substance Registry reviewed and refill is appropriate. Paper prescription provided to patient today after she completed treatment.   Mike Craze, NP [place] [phone]

## 2016-08-01 NOTE — Patient Instructions (Signed)
[facility] Discharge Instructions for Patients Receiving Chemotherapy   Beginning January 23rd 2017 lab work for the [facility] will be done in the  Main lab at [facility] on 1st floor. If you have a lab appointment with the [facility] please come in thru the  Main Entrance and check in at the main information desk   Today you received the following chemotherapy agent: Taxol.     If you develop nausea and vomiting, or diarrhea that is not controlled by your medication, call the clinic.  The clinic phone number is [phone]. Office hours are Monday-Friday 8:30am-5:00pm.  BELOW ARE SYMPTOMS THAT SHOULD BE REPORTED IMMEDIATELY:  *FEVER GREATER THAN 101.0 F  *CHILLS WITH OR WITHOUT FEVER  NAUSEA AND VOMITING THAT IS NOT CONTROLLED WITH YOUR NAUSEA MEDICATION  *UNUSUAL SHORTNESS OF BREATH  *UNUSUAL BRUISING OR BLEEDING  TENDERNESS IN MOUTH AND THROAT WITH OR WITHOUT PRESENCE OF ULCERS  *URINARY PROBLEMS  *BOWEL PROBLEMS  UNUSUAL RASH Items with * indicate a potential emergency and should be followed up as soon as possible. If you have an emergency after office hours please contact your primary care physician or go to the nearest emergency department.  Please call the clinic during office hours if you have any questions or concerns.   You may also contact the Patient Navigator at [phone] should you have any questions or need assistance in obtaining follow up care.      Resources For Cancer Patients and their Caregivers ? American Cancer Society: Can assist with transportation, wigs, general needs, runs Look Good Feel Better.        814-666-3196 ? Cancer Care: Provides financial assistance, online support groups, medication/co-pay assistance.  1-800-813-HOPE 570-420-4520) ? [facility] Assists [place] cancer patients and their families through emotional , educational and financial support.   978-455-2277 ? [place] DSS Where to apply for food stamps, Medicaid and utility assistance. (570)512-9140 ? RCATS: Transportation to medical appointments. 9160989271 ? Social Security Administration: May apply for disability if have a Stage IV cancer. 947-183-5549 3150048311 ? [employer], Disability and Transit Services: Assists with nutrition, care and transit needs. [phone]

## 2016-08-01 NOTE — Progress Notes (Signed)
Patient tolerated infusion well.  VSS.  Patient ambulatory and stable upon discharge from clinic.   

## 2016-08-08 ENCOUNTER — Encounter (HOSPITAL_BASED_OUTPATIENT_CLINIC_OR_DEPARTMENT_OTHER): Payer: Medicare HMO

## 2016-08-08 ENCOUNTER — Encounter (HOSPITAL_COMMUNITY): Payer: Self-pay | Admitting: Adult Health

## 2016-08-08 ENCOUNTER — Other Ambulatory Visit (HOSPITAL_COMMUNITY): Payer: Self-pay | Admitting: *Deleted

## 2016-08-08 ENCOUNTER — Encounter (HOSPITAL_COMMUNITY): Payer: Self-pay

## 2016-08-08 VITALS — BP 145/55 | HR 90 | Temp 98.3°F | Resp 18 | Wt 167.0 lb

## 2016-08-08 DIAGNOSIS — Z800 Family history of malignant neoplasm of digestive organs: Secondary | ICD-10-CM | POA: Diagnosis not present

## 2016-08-08 DIAGNOSIS — J449 Chronic obstructive pulmonary disease, unspecified: Secondary | ICD-10-CM | POA: Diagnosis not present

## 2016-08-08 DIAGNOSIS — C773 Secondary and unspecified malignant neoplasm of axilla and upper limb lymph nodes: Secondary | ICD-10-CM | POA: Diagnosis not present

## 2016-08-08 DIAGNOSIS — Z5111 Encounter for antineoplastic chemotherapy: Secondary | ICD-10-CM | POA: Diagnosis not present

## 2016-08-08 DIAGNOSIS — Z8673 Personal history of transient ischemic attack (TIA), and cerebral infarction without residual deficits: Secondary | ICD-10-CM | POA: Diagnosis not present

## 2016-08-08 DIAGNOSIS — C50911 Malignant neoplasm of unspecified site of right female breast: Secondary | ICD-10-CM

## 2016-08-08 DIAGNOSIS — C50411 Malignant neoplasm of upper-outer quadrant of right female breast: Secondary | ICD-10-CM | POA: Diagnosis not present

## 2016-08-08 DIAGNOSIS — Z9889 Other specified postprocedural states: Secondary | ICD-10-CM | POA: Diagnosis not present

## 2016-08-08 DIAGNOSIS — Z8052 Family history of malignant neoplasm of bladder: Secondary | ICD-10-CM | POA: Diagnosis not present

## 2016-08-08 DIAGNOSIS — Z9221 Personal history of antineoplastic chemotherapy: Secondary | ICD-10-CM | POA: Diagnosis not present

## 2016-08-08 DIAGNOSIS — E785 Hyperlipidemia, unspecified: Secondary | ICD-10-CM | POA: Diagnosis not present

## 2016-08-08 DIAGNOSIS — I10 Essential (primary) hypertension: Secondary | ICD-10-CM | POA: Diagnosis not present

## 2016-08-08 LAB — CBC WITH DIFFERENTIAL/PLATELET
BASOS ABS: 0 10*3/uL (ref 0.0–0.1)
Basophils Relative: 1 %
Eosinophils Absolute: 0.3 10*3/uL (ref 0.0–0.7)
Eosinophils Relative: 6 %
HEMATOCRIT: 32.2 % — AB (ref 36.0–46.0)
HEMOGLOBIN: 11.1 g/dL — AB (ref 12.0–15.0)
LYMPHS ABS: 1.5 10*3/uL (ref 0.7–4.0)
LYMPHS PCT: 30 %
MCH: 32.8 pg (ref 26.0–34.0)
MCHC: 34.5 g/dL (ref 30.0–36.0)
MCV: 95.3 fL (ref 78.0–100.0)
Monocytes Absolute: 0.4 10*3/uL (ref 0.1–1.0)
Monocytes Relative: 9 %
NEUTROS ABS: 2.7 10*3/uL (ref 1.7–7.7)
Neutrophils Relative %: 54 %
PLATELETS: 232 10*3/uL (ref 150–400)
RBC: 3.38 MIL/uL — AB (ref 3.87–5.11)
RDW: 17 % — ABNORMAL HIGH (ref 11.5–15.5)
WBC: 4.9 10*3/uL (ref 4.0–10.5)

## 2016-08-08 LAB — COMPREHENSIVE METABOLIC PANEL
ALT: 34 U/L (ref 14–54)
AST: 36 U/L (ref 15–41)
Albumin: 4 g/dL (ref 3.5–5.0)
Alkaline Phosphatase: 79 U/L (ref 38–126)
Anion gap: 7 (ref 5–15)
BUN: 13 mg/dL (ref 6–20)
CHLORIDE: 100 mmol/L — AB (ref 101–111)
CO2: 24 mmol/L (ref 22–32)
CREATININE: 0.81 mg/dL (ref 0.44–1.00)
Calcium: 8.9 mg/dL (ref 8.9–10.3)
GFR calc non Af Amer: >60 mL/min (ref >60)
Glucose, Bld: 109 mg/dL — ABNORMAL HIGH (ref 65–99)
Potassium: 3.6 mmol/L (ref 3.5–5.1)
SODIUM: 131 mmol/L — AB (ref 135–145)
Total Bilirubin: 0.7 mg/dL (ref 0.3–1.2)
Total Protein: 6.6 g/dL (ref 6.5–8.1)

## 2016-08-08 MED ORDER — DIPHENHYDRAMINE HCL 50 MG/ML IJ SOLN
50.0000 mg | Freq: Once | INTRAMUSCULAR | Status: AC
  Administered 2016-08-08: 50 mg via INTRAVENOUS
  Filled 2016-08-08: qty 1

## 2016-08-08 MED ORDER — FAMOTIDINE IN NACL 20-0.9 MG/50ML-% IV SOLN
20.0000 mg | Freq: Once | INTRAVENOUS | Status: AC
Start: 2016-08-08 — End: 2016-08-08
  Administered 2016-08-08: 20 mg via INTRAVENOUS
  Filled 2016-08-08: qty 50

## 2016-08-08 MED ORDER — OXYCODONE-ACETAMINOPHEN 10-325 MG PO TABS: 1.0000 | ORAL_TABLET | ORAL | 0 refills | Status: DC | PRN

## 2016-08-08 MED ORDER — PACLITAXEL CHEMO INJECTION 300 MG/50ML
80.0000 mg/m2 | Freq: Once | INTRAVENOUS | Status: AC
  Administered 2016-08-08: 144 mg via INTRAVENOUS
  Filled 2016-08-08: qty 24

## 2016-08-08 MED ORDER — SODIUM CHLORIDE 0.9 % IV SOLN
20.0000 mg | Freq: Once | INTRAVENOUS | Status: AC
  Administered 2016-08-08: 20 mg via INTRAVENOUS
  Filled 2016-08-08: qty 2

## 2016-08-08 MED ORDER — SODIUM CHLORIDE 0.9% FLUSH
10.0000 mL | INTRAVENOUS | Status: DC | PRN
  Administered 2016-08-08: 10 mL
  Filled 2016-08-08: qty 10

## 2016-08-08 MED ORDER — SODIUM CHLORIDE 0.9 % IV SOLN
Freq: Once | INTRAVENOUS | Status: AC
  Administered 2016-08-08: 15:00:00 via INTRAVENOUS

## 2016-08-08 MED ORDER — HEPARIN SOD (PORK) LOCK FLUSH 100 UNIT/ML IV SOLN
500.0000 [IU] | Freq: Once | INTRAVENOUS | Status: AC | PRN
  Administered 2016-08-08: 500 [IU]
  Filled 2016-08-08 (×2): qty 5

## 2016-08-08 NOTE — Patient Instructions (Signed)
[facility] Discharge Instructions for Patients Receiving Chemotherapy   Beginning January 23rd 2017 lab work for the Cancer Center will be done in the  Main lab at  on 1st floor. If you have a lab appointment with the Cancer Center please come in thru the  Main Entrance and check in at the main information desk   Today you received the following chemotherapy agents   To help prevent nausea and vomiting after your treatment, we encourage you to take your nausea medication     If you develop nausea and vomiting, or diarrhea that is not controlled by your medication, call the clinic.  The clinic phone number is [phone]. Office hours are Monday-Friday 8:30am-5:00pm.  BELOW ARE SYMPTOMS THAT SHOULD BE REPORTED IMMEDIATELY:  *FEVER GREATER THAN 101.0 F  *CHILLS WITH OR WITHOUT FEVER  NAUSEA AND VOMITING THAT IS NOT CONTROLLED WITH YOUR NAUSEA MEDICATION  *UNUSUAL SHORTNESS OF BREATH  *UNUSUAL BRUISING OR BLEEDING  TENDERNESS IN MOUTH AND THROAT WITH OR WITHOUT PRESENCE OF ULCERS  *URINARY PROBLEMS  *BOWEL PROBLEMS  UNUSUAL RASH Items with * indicate a potential emergency and should be followed up as soon as possible. If you have an emergency after office hours please contact your primary care physician or go to the nearest emergency department.  Please call the clinic during office hours if you have any questions or concerns.   You may also contact the Patient Navigator at [phone] should you have any questions or need assistance in obtaining follow up care.      Resources For Cancer Patients and their Caregivers ? American Cancer Society: Can assist with transportation, wigs, general needs, runs Look Good Feel Better.        1-888-227-6333 ? Cancer Care: Provides financial assistance, online support groups, medication/co-pay assistance.  1-800-813-HOPE (4673) ? [facility] Assists [place] cancer  patients and their families through emotional , educational and financial support.  336-427-4357 ? [place] DSS Where to apply for food stamps, Medicaid and utility assistance. 336-342-1394 ? RCATS: Transportation to medical appointments. 336-347-2287 ? Social Security Administration: May apply for disability if have a Stage IV cancer. 336-342-7796 1-800-772-1213 ? [place] Aging, Disability and Transit Services: Assists with nutrition, care and transit needs. 336-349-2343         

## 2016-08-08 NOTE — Progress Notes (Signed)
Refilled pain medicine. Instructed patient that this will have to last her a month per Mike Craze NP.  Chemotherapy given today per orders. Patient tolerated it well, no issues. Vitals stable and discharged home from clinic ambulatory with husband. Follow up as scheduled.

## 2016-08-08 NOTE — Progress Notes (Signed)
Patient here for weekly Taxol. Requesting refill of Percocet; states she has no pain pills left.    Relayed to RN that I would refill Percocet this time, but she needed to try some OTC Aleve 2 tabs/day for arthralgias, as this is a better analgesic for joint pain.  Her platelets are preserved, so NSAIDs okay at this time.  This prescription needs to last 1 month for her.   Mike Craze, NP [place] [phone]    [place] Controlled Substance Registry reviewed:

## 2016-08-15 ENCOUNTER — Encounter (HOSPITAL_COMMUNITY): Payer: Medicare HMO | Attending: Oncology

## 2016-08-15 ENCOUNTER — Encounter (HOSPITAL_COMMUNITY): Payer: Self-pay

## 2016-08-15 VITALS — BP 131/66 | HR 87 | Temp 98.9°F | Resp 18 | Wt 169.2 lb

## 2016-08-15 DIAGNOSIS — Z800 Family history of malignant neoplasm of digestive organs: Secondary | ICD-10-CM | POA: Insufficient documentation

## 2016-08-15 DIAGNOSIS — Z5111 Encounter for antineoplastic chemotherapy: Secondary | ICD-10-CM

## 2016-08-15 DIAGNOSIS — Z9889 Other specified postprocedural states: Secondary | ICD-10-CM | POA: Insufficient documentation

## 2016-08-15 DIAGNOSIS — Z8673 Personal history of transient ischemic attack (TIA), and cerebral infarction without residual deficits: Secondary | ICD-10-CM | POA: Diagnosis not present

## 2016-08-15 DIAGNOSIS — I10 Essential (primary) hypertension: Secondary | ICD-10-CM | POA: Diagnosis not present

## 2016-08-15 DIAGNOSIS — C50411 Malignant neoplasm of upper-outer quadrant of right female breast: Secondary | ICD-10-CM | POA: Insufficient documentation

## 2016-08-15 DIAGNOSIS — Z8052 Family history of malignant neoplasm of bladder: Secondary | ICD-10-CM | POA: Diagnosis not present

## 2016-08-15 DIAGNOSIS — E785 Hyperlipidemia, unspecified: Secondary | ICD-10-CM | POA: Insufficient documentation

## 2016-08-15 DIAGNOSIS — C50911 Malignant neoplasm of unspecified site of right female breast: Secondary | ICD-10-CM

## 2016-08-15 DIAGNOSIS — F17210 Nicotine dependence, cigarettes, uncomplicated: Secondary | ICD-10-CM | POA: Diagnosis not present

## 2016-08-15 DIAGNOSIS — J449 Chronic obstructive pulmonary disease, unspecified: Secondary | ICD-10-CM | POA: Insufficient documentation

## 2016-08-15 DIAGNOSIS — C773 Secondary and unspecified malignant neoplasm of axilla and upper limb lymph nodes: Secondary | ICD-10-CM

## 2016-08-15 DIAGNOSIS — Z9221 Personal history of antineoplastic chemotherapy: Secondary | ICD-10-CM | POA: Insufficient documentation

## 2016-08-15 LAB — CBC WITH DIFFERENTIAL/PLATELET
BASOS PCT: 0 %
Basophils Absolute: 0 10*3/uL (ref 0.0–0.1)
EOS ABS: 0.2 10*3/uL (ref 0.0–0.7)
Eosinophils Relative: 3 %
HCT: 32.6 % — ABNORMAL LOW (ref 36.0–46.0)
HEMOGLOBIN: 11.3 g/dL — AB (ref 12.0–15.0)
Lymphocytes Relative: 17 %
Lymphs Abs: 1 10*3/uL (ref 0.7–4.0)
MCH: 32.8 pg (ref 26.0–34.0)
MCHC: 34.7 g/dL (ref 30.0–36.0)
MCV: 94.8 fL (ref 78.0–100.0)
Monocytes Absolute: 0.4 10*3/uL (ref 0.1–1.0)
Monocytes Relative: 7 %
NEUTROS PCT: 73 %
Neutro Abs: 4.2 10*3/uL (ref 1.7–7.7)
Platelets: 211 10*3/uL (ref 150–400)
RBC: 3.44 MIL/uL — AB (ref 3.87–5.11)
RDW: 17.5 % — ABNORMAL HIGH (ref 11.5–15.5)
WBC: 5.7 10*3/uL (ref 4.0–10.5)

## 2016-08-15 LAB — COMPREHENSIVE METABOLIC PANEL
ALBUMIN: 3.9 g/dL (ref 3.5–5.0)
ALK PHOS: 76 U/L (ref 38–126)
ALT: 31 U/L (ref 14–54)
ANION GAP: 7 (ref 5–15)
AST: 35 U/L (ref 15–41)
BUN: 13 mg/dL (ref 6–20)
CALCIUM: 9.5 mg/dL (ref 8.9–10.3)
CO2: 25 mmol/L (ref 22–32)
CREATININE: 0.77 mg/dL (ref 0.44–1.00)
Chloride: 101 mmol/L (ref 101–111)
GFR calc Af Amer: >60 mL/min (ref >60)
GFR calc non Af Amer: >60 mL/min (ref >60)
GLUCOSE: 128 mg/dL — AB (ref 65–99)
Potassium: 3.7 mmol/L (ref 3.5–5.1)
Sodium: 133 mmol/L — ABNORMAL LOW (ref 135–145)
Total Bilirubin: 0.6 mg/dL (ref 0.3–1.2)
Total Protein: 6.4 g/dL — ABNORMAL LOW (ref 6.5–8.1)

## 2016-08-15 MED ORDER — PACLITAXEL CHEMO INJECTION 300 MG/50ML
80.0000 mg/m2 | Freq: Once | INTRAVENOUS | Status: AC
  Administered 2016-08-15: 144 mg via INTRAVENOUS
  Filled 2016-08-15: qty 24

## 2016-08-15 MED ORDER — HEPARIN SOD (PORK) LOCK FLUSH 100 UNIT/ML IV SOLN
500.0000 [IU] | Freq: Once | INTRAVENOUS | Status: AC | PRN
  Administered 2016-08-15: 500 [IU]
  Filled 2016-08-15: qty 5

## 2016-08-15 MED ORDER — FAMOTIDINE IN NACL 20-0.9 MG/50ML-% IV SOLN
20.0000 mg | Freq: Once | INTRAVENOUS | Status: AC
  Administered 2016-08-15: 20 mg via INTRAVENOUS
  Filled 2016-08-15: qty 50

## 2016-08-15 MED ORDER — SODIUM CHLORIDE 0.9% FLUSH
10.0000 mL | INTRAVENOUS | Status: DC | PRN
  Administered 2016-08-15: 10 mL
  Filled 2016-08-15: qty 10

## 2016-08-15 MED ORDER — DIPHENHYDRAMINE HCL 50 MG/ML IJ SOLN
50.0000 mg | Freq: Once | INTRAMUSCULAR | Status: AC
  Administered 2016-08-15: 50 mg via INTRAVENOUS
  Filled 2016-08-15: qty 1

## 2016-08-15 MED ORDER — SODIUM CHLORIDE 0.9 % IV SOLN
20.0000 mg | Freq: Once | INTRAVENOUS | Status: AC
  Administered 2016-08-15: 20 mg via INTRAVENOUS
  Filled 2016-08-15: qty 2

## 2016-08-15 MED ORDER — SODIUM CHLORIDE 0.9 % IV SOLN
Freq: Once | INTRAVENOUS | Status: AC
  Administered 2016-08-15: 14:00:00 via INTRAVENOUS

## 2016-08-15 NOTE — Patient Instructions (Signed)
[facility] Discharge Instructions for Patients Receiving Chemotherapy   Beginning January 23rd 2017 lab work for the Cancer Center will be done in the  Main lab at [facility] on 1st floor. If you have a lab appointment with the Cancer Center please come in thru the  Main Entrance and check in at the main information desk   Today you received the following chemotherapy agents   To help prevent nausea and vomiting after your treatment, we encourage you to take your nausea medication     If you develop nausea and vomiting, or diarrhea that is not controlled by your medication, call the clinic.  The clinic phone number is [phone]. Office hours are Monday-Friday 8:30am-5:00pm.  BELOW ARE SYMPTOMS THAT SHOULD BE REPORTED IMMEDIATELY:  *FEVER GREATER THAN 101.0 F  *CHILLS WITH OR WITHOUT FEVER  NAUSEA AND VOMITING THAT IS NOT CONTROLLED WITH YOUR NAUSEA MEDICATION  *UNUSUAL SHORTNESS OF BREATH  *UNUSUAL BRUISING OR BLEEDING  TENDERNESS IN MOUTH AND THROAT WITH OR WITHOUT PRESENCE OF ULCERS  *URINARY PROBLEMS  *BOWEL PROBLEMS  UNUSUAL RASH Items with * indicate a potential emergency and should be followed up as soon as possible. If you have an emergency after office hours please contact your primary care physician or go to the nearest emergency department.  Please call the clinic during office hours if you have any questions or concerns.   You may also contact the Patient Navigator at [phone] should you have any questions or need assistance in obtaining follow up care.      Resources For Cancer Patients and their Caregivers ? American Cancer Society: Can assist with transportation, wigs, general needs, runs Look Good Feel Better.        1-888-227-6333 ? Cancer Care: Provides financial assistance, online support groups, medication/co-pay assistance.  1-800-813-HOPE (4673) ? [facility] Assists [place] cancer  patients and their families through emotional , educational and financial support.  336-427-4357 ? [place] DSS Where to apply for food stamps, Medicaid and utility assistance. 336-342-1394 ? RCATS: Transportation to medical appointments. 336-347-2287 ? Social Security Administration: May apply for disability if have a Stage IV cancer. 336-342-7796 1-800-772-1213 ? [place] Aging, Disability and Transit Services: Assists with nutrition, care and transit needs. 336-349-2343         

## 2016-08-15 NOTE — Progress Notes (Signed)
Labs reviewed with Dr. Talbert Cage , proceed with treatment.  Chemotherapy given today per orders. Patient tolerated it well without problems. Vitals stable and discharged home from clinic ambulatory. Follow up as scheduled.

## 2016-08-20 ENCOUNTER — Inpatient Hospital Stay (HOSPITAL_COMMUNITY): Payer: Medicare HMO

## 2016-08-20 ENCOUNTER — Ambulatory Visit (HOSPITAL_COMMUNITY): Payer: Medicare HMO | Admitting: Oncology

## 2016-08-22 ENCOUNTER — Encounter (HOSPITAL_BASED_OUTPATIENT_CLINIC_OR_DEPARTMENT_OTHER): Payer: Medicare HMO | Admitting: Oncology

## 2016-08-22 ENCOUNTER — Encounter (HOSPITAL_COMMUNITY): Payer: Self-pay | Admitting: Adult Health

## 2016-08-22 ENCOUNTER — Inpatient Hospital Stay (HOSPITAL_COMMUNITY): Payer: Medicare HMO

## 2016-08-22 ENCOUNTER — Encounter (HOSPITAL_BASED_OUTPATIENT_CLINIC_OR_DEPARTMENT_OTHER): Payer: Medicare HMO

## 2016-08-22 ENCOUNTER — Ambulatory Visit (HOSPITAL_COMMUNITY): Payer: Medicare HMO | Admitting: Oncology

## 2016-08-22 ENCOUNTER — Encounter (HOSPITAL_COMMUNITY): Payer: Self-pay

## 2016-08-22 VITALS — BP 135/68 | HR 86 | Temp 98.6°F | Resp 18 | Wt 168.9 lb

## 2016-08-22 DIAGNOSIS — Z720 Tobacco use: Secondary | ICD-10-CM

## 2016-08-22 DIAGNOSIS — C50911 Malignant neoplasm of unspecified site of right female breast: Secondary | ICD-10-CM

## 2016-08-22 DIAGNOSIS — Z9221 Personal history of antineoplastic chemotherapy: Secondary | ICD-10-CM | POA: Diagnosis not present

## 2016-08-22 DIAGNOSIS — C773 Secondary and unspecified malignant neoplasm of axilla and upper limb lymph nodes: Secondary | ICD-10-CM

## 2016-08-22 DIAGNOSIS — Z170 Estrogen receptor positive status [ER+]: Secondary | ICD-10-CM

## 2016-08-22 DIAGNOSIS — K760 Fatty (change of) liver, not elsewhere classified: Secondary | ICD-10-CM | POA: Diagnosis not present

## 2016-08-22 DIAGNOSIS — C50411 Malignant neoplasm of upper-outer quadrant of right female breast: Secondary | ICD-10-CM

## 2016-08-22 DIAGNOSIS — Z8673 Personal history of transient ischemic attack (TIA), and cerebral infarction without residual deficits: Secondary | ICD-10-CM | POA: Diagnosis not present

## 2016-08-22 DIAGNOSIS — Z800 Family history of malignant neoplasm of digestive organs: Secondary | ICD-10-CM | POA: Diagnosis not present

## 2016-08-22 DIAGNOSIS — Z9889 Other specified postprocedural states: Secondary | ICD-10-CM | POA: Diagnosis not present

## 2016-08-22 DIAGNOSIS — Z5111 Encounter for antineoplastic chemotherapy: Secondary | ICD-10-CM

## 2016-08-22 DIAGNOSIS — Z8052 Family history of malignant neoplasm of bladder: Secondary | ICD-10-CM | POA: Diagnosis not present

## 2016-08-22 DIAGNOSIS — J449 Chronic obstructive pulmonary disease, unspecified: Secondary | ICD-10-CM | POA: Diagnosis not present

## 2016-08-22 DIAGNOSIS — I10 Essential (primary) hypertension: Secondary | ICD-10-CM | POA: Diagnosis not present

## 2016-08-22 DIAGNOSIS — E785 Hyperlipidemia, unspecified: Secondary | ICD-10-CM | POA: Diagnosis not present

## 2016-08-22 LAB — COMPREHENSIVE METABOLIC PANEL
ALT: 34 U/L (ref 14–54)
AST: 27 U/L (ref 15–41)
Albumin: 4.2 g/dL (ref 3.5–5.0)
Alkaline Phosphatase: 71 U/L (ref 38–126)
Anion gap: 7 (ref 5–15)
BUN: 8 mg/dL (ref 6–20)
CHLORIDE: 99 mmol/L — AB (ref 101–111)
CO2: 25 mmol/L (ref 22–32)
Calcium: 9.5 mg/dL (ref 8.9–10.3)
Creatinine, Ser: 0.79 mg/dL (ref 0.44–1.00)
Glucose, Bld: 99 mg/dL (ref 65–99)
POTASSIUM: 4.2 mmol/L (ref 3.5–5.1)
Sodium: 131 mmol/L — ABNORMAL LOW (ref 135–145)
Total Bilirubin: 0.8 mg/dL (ref 0.3–1.2)
Total Protein: 6.6 g/dL (ref 6.5–8.1)

## 2016-08-22 LAB — CBC WITH DIFFERENTIAL/PLATELET
BASOS ABS: 0 10*3/uL (ref 0.0–0.1)
BASOS PCT: 1 %
EOS ABS: 0.2 10*3/uL (ref 0.0–0.7)
EOS PCT: 3 %
HCT: 31.6 % — ABNORMAL LOW (ref 36.0–46.0)
Hemoglobin: 10.7 g/dL — ABNORMAL LOW (ref 12.0–15.0)
LYMPHS PCT: 27 %
Lymphs Abs: 1.4 10*3/uL (ref 0.7–4.0)
MCH: 32.3 pg (ref 26.0–34.0)
MCHC: 33.9 g/dL (ref 30.0–36.0)
MCV: 95.5 fL (ref 78.0–100.0)
MONO ABS: 0.5 10*3/uL (ref 0.1–1.0)
Monocytes Relative: 8 %
Neutro Abs: 3.3 10*3/uL (ref 1.7–7.7)
Neutrophils Relative %: 61 %
PLATELETS: 256 10*3/uL (ref 150–400)
RBC: 3.31 MIL/uL — AB (ref 3.87–5.11)
RDW: 17.8 % — AB (ref 11.5–15.5)
WBC: 5.4 10*3/uL (ref 4.0–10.5)

## 2016-08-22 MED ORDER — SODIUM CHLORIDE 0.9 % IV SOLN
Freq: Once | INTRAVENOUS | Status: AC
  Administered 2016-08-22: 13:00:00 via INTRAVENOUS

## 2016-08-22 MED ORDER — PACLITAXEL CHEMO INJECTION 300 MG/50ML
80.0000 mg/m2 | Freq: Once | INTRAVENOUS | Status: AC
  Administered 2016-08-22: 144 mg via INTRAVENOUS
  Filled 2016-08-22: qty 24

## 2016-08-22 MED ORDER — SODIUM CHLORIDE 0.9 % IV SOLN
20.0000 mg | Freq: Once | INTRAVENOUS | Status: AC
  Administered 2016-08-22: 20 mg via INTRAVENOUS
  Filled 2016-08-22: qty 2

## 2016-08-22 MED ORDER — FAMOTIDINE IN NACL 20-0.9 MG/50ML-% IV SOLN
INTRAVENOUS | Status: AC
  Filled 2016-08-22: qty 50

## 2016-08-22 MED ORDER — HEPARIN SOD (PORK) LOCK FLUSH 100 UNIT/ML IV SOLN
500.0000 [IU] | Freq: Once | INTRAVENOUS | Status: AC | PRN
  Administered 2016-08-22: 500 [IU]

## 2016-08-22 MED ORDER — SODIUM CHLORIDE 0.9% FLUSH
10.0000 mL | INTRAVENOUS | Status: DC | PRN
  Administered 2016-08-22: 10 mL
  Filled 2016-08-22: qty 10

## 2016-08-22 MED ORDER — DIPHENHYDRAMINE HCL 50 MG/ML IJ SOLN
INTRAMUSCULAR | Status: AC
  Filled 2016-08-22: qty 1

## 2016-08-22 MED ORDER — GABAPENTIN 300 MG PO CAPS: 300.0000 mg | ORAL_CAPSULE | Freq: Three times a day (TID) | ORAL | 2 refills | Status: DC

## 2016-08-22 MED ORDER — FAMOTIDINE IN NACL 20-0.9 MG/50ML-% IV SOLN
20.0000 mg | Freq: Once | INTRAVENOUS | Status: AC
  Administered 2016-08-22: 20 mg via INTRAVENOUS

## 2016-08-22 MED ORDER — DIPHENHYDRAMINE HCL 50 MG/ML IJ SOLN
50.0000 mg | Freq: Once | INTRAMUSCULAR | Status: AC
  Administered 2016-08-22: 50 mg via INTRAVENOUS

## 2016-08-22 NOTE — Patient Instructions (Signed)
[facility] at [facility] Discharge Instructions  RECOMMENDATIONS MADE BY THE CONSULTANT AND ANY TEST RESULTS WILL BE SENT TO YOUR REFERRING PHYSICIAN.  You were seen today by Dr. Louise Zhou Follow up in 2 weeks See Amy up front for appointments   Thank you for choosing [facility] at [facility] to provide your oncology and hematology care.  To afford each patient quality time with our provider, please arrive at least 15 minutes before your scheduled appointment time.    If you have a lab appointment with the Cancer Center please come in thru the  Main Entrance and check in at the main information desk  You need to re-schedule your appointment should you arrive 10 or more minutes late.  We strive to give you quality time with our providers, and arriving late affects you and other patients whose appointments are after yours.  Also, if you no show three or more times for appointments you may be dismissed from the clinic at the providers discretion.     Again, thank you for choosing [facility].  Our hope is that these requests will decrease the amount of time that you wait before being seen by our physicians.       _____________________________________________________________  Should you have questions after your visit to [facility], please contact our office at [phone] between the hours of 8:30 a.m. and 4:30 p.m.  Voicemails left after 4:30 p.m. will not be returned until the following business day.  For prescription refill requests, have your pharmacy contact our office.       Resources For Cancer Patients and their Caregivers ? American Cancer Society: Can assist with transportation, wigs, general needs, runs Look Good Feel Better.        1-888-227-6333 ? Cancer Care: Provides financial assistance, online support groups, medication/co-pay assistance.  1-800-813-HOPE (4673) ? [facility] Assists [place] cancer patients and their families through emotional , educational and financial support.  336-427-4357 ? [place] DSS Where to apply for food stamps, Medicaid and utility assistance. 336-342-1394 ? RCATS: Transportation to medical appointments. 336-347-2287 ? Social Security Administration: May apply for disability if have a Stage IV cancer. 336-342-7796 1-800-772-1213 ? [place] Aging, Disability and Transit Services: Assists with nutrition, care and transit needs. 336-349-2343  Cancer Center Support Programs: [email or web address]@ > Cancer Support Group  2nd Tuesday of the month 1pm-2pm, Journey Room  > Creative Journey  3rd Tuesday of the month 1130am-1pm, Journey Room  > Look Good Feel Better  1st Wednesday of the month 10am-12 noon, Journey Room (Call American Cancer Society to register 1-800-395-5775)    

## 2016-08-22 NOTE — Progress Notes (Signed)
[facility] Hematology/Oncology Progress Note  Name: Ashley Doyle      MRN: [id number]    Date: 08/22/2016 Time:12:05 PM   REFERRING PHYSICIAN:  Wende Neighbors, MD (Primary Care Provider)  REASON FOR CONSULT:  Grade 2 invasive ductal carcinoma   DIAGNOSIS:  Invasive ductal carcinoma of right breast ER/PR +, HER2 NEGATIVE, with right axillary lymph node positive for disease.    Invasive ductal carcinoma of breast, female, right ([place])   02/21/2016 Mammogram    Irregular vascular mass in the right breast at 10 o'clock, 10 cm the nipple, measuring 2.9 x 2.5 x 2.6 cm. In the right axilla there is 3 discrete enlarged abnormal appearing lymph nodes. Largest, which lies lateral to the palpable mass, measures 1.9 x 1.3 x 1.9 cm.      02/28/2016 Procedure    Right needle core biopsy in 10:00 position and right axillary biopsy      02/28/2016 Procedure    Postprocedure mammogram for clip placement. Ribbon shaped clip well-positioned within the biopsied mass in the right breast at the 10 o'clock axis. Wing shaped clip is well-positioned within the biopsied lymph node as seen on postprocedure ultrasound evaluation.  Final Assessment: Post Procedure Mammograms for Marker Placement      03/01/2016 Pathology Results    Invasive ductal carcinoma with extracellular mucin of right breast mass and ductal carcinoma of right axillary lymph node.  Grade 2.  ER 100%, PR 100%, Ki-67 12%, HER2 NEGATIVE.      03/12/2016 Imaging    CT CAP- Right breast cancer with associated right axillary metastatic lymph nodes. 2. No evidence of distant metastatic disease. 3. Hepatic steatosis.      03/26/2016 Breast MRI    2.7 cm spiculated mass in the upper-outer quadrant of right breast corresponding with the invasive ductal carcinoma. Masses in the right axilla consistent with axillary adenopathy.      04/16/2016 -  Chemotherapy    The patient had DOXOrubicin (ADRIAMYCIN) chemo  injection 116 mg, 60 mg/m2 = 116 mg, Intravenous,  Once, 3 of 4 cycles  palonosetron (ALOXI) injection 0.25 mg, 0.25 mg, Intravenous,  Once, 3 of 4 cycles  pegfilgrastim (NEULASTA ONPRO KIT) injection 6 mg, 6 mg, Subcutaneous, Once, 3 of 4 cycles  cyclophosphamide (CYTOXAN) 1,160 mg in sodium chloride 0.9 % 250 mL chemo infusion, 600 mg/m2 = 1,160 mg, Intravenous,  Once, 3 of 4 cycles  PACLitaxel (TAXOL) 156 mg in dextrose 5 % 250 mL chemo infusion ( fosaprepitant (EMEND) 150 mg, dexamethasone (DECADRON) 12 mg in sodium chloride 0.9 % 145 mL IVPB, , Intravenous,  Once, 3 of 4 cycles  for chemotherapy treatment.        05/28/2016 Adverse Reaction    Thrombocytopenia      05/28/2016 Treatment Plan Change    AC #4 delayed x 1 week      06/13/2016 Treatment Plan Change    Adriamycin d/c'd for cycle #4 due to concerns for cardiomyopathy/fluid volume overload.       06/14/2016 Echocardiogram    EF 60-65%      HISTORY OF PRESENT ILLNESS:   Ashley Doyle is a 71 y.o. female with a medical history significant for tobacco abuse, hyperlipidemia, major depressive disorder, scistica, dysthymic disorder, esstenial HTN,  who is here for a follow-up invasive ductal carcinoma of right breast ER/PR +, HER2 NEGATIVE, with right axillary lymph node positive for disease.   Patient presents today for follow up.  She will be getting cycle 7 of taxol today. She is doing okay today, tolerating treatment well. She has a lot of chronic joint/bone pains which have been presents even prior to her breast cancer diagnosis. She has arthritis, sciatica, and pinched nerves. Her PCP was giving her pain medication, but he hasn't given her any since she started getting chemo. Her fingers and toes "throb", but she can still button her clothes and feel the ground. She also has some burning in her feet. She believes she might want a mastectomy rather than a lumpectomy because she doesn't believe she will be able to get to  radiation 5 days a week. Denies loss of appetite, or any other concerns. She smokes 2 ppd.   Review of Systems  Constitutional: Negative.        No loss of appetite   HENT: Positive for hearing loss.   Eyes: Negative.   Respiratory: Negative.   Cardiovascular: Negative.   Gastrointestinal: Negative.   Genitourinary: Negative.   Musculoskeletal: Positive for joint pain (arthritis).  Skin: Negative.   Neurological: Positive for tingling (hands and feet).       Burning in feet  Endo/Heme/Allergies: Negative.   Psychiatric/Behavioral: Negative.   All other systems reviewed and are negative. 14 point review of systems was performed and is negative except as detailed under history of present illness and above  PAST MEDICAL HISTORY:   Past Medical History:  Diagnosis Date  . Anxiety   . Arthritis   . Asthma   . Breast cancer ([place])   . COPD (chronic obstructive pulmonary disease) ([place])   . Depression   . Emphysema of lung ([place])   . Family history of adverse reaction to anesthesia    sister has PONV  . GERD (gastroesophageal reflux disease)   . HOH (hard of hearing)   . Hypertension   . Invasive ductal carcinoma of breast, female, right ([place]) 03/07/2016  . Pinched nerve in neck    and back  . Stroke ([place])   . Tuberculosis    "several years ago" 04/11/16  . Wears glasses   . Wears hearing aid    left ear    ALLERGIES: No Known Allergies    MEDICATIONS: I have reviewed the patient's current medications.    Current Outpatient Prescriptions on File Prior to Visit  Medication Sig Dispense Refill  . citalopram (CELEXA) 40 MG tablet Take 40 mg by mouth daily.  4  . dexamethasone (DECADRON) 4 MG tablet Take 2 tablets by mouth once a day on the day after chemotherapy and then take 2 tablets two times a day for 2 days. Take with food. 30 tablet 1  . lidocaine-prilocaine (EMLA) cream Apply to affected area once 30 g 3  . LORazepam (ATIVAN) 0.5 MG tablet Take 1 tablet (0.5 mg  total) by mouth every 8 (eight) hours. 30 tablet 0  . omeprazole (PRILOSEC) 20 MG capsule Take 1 capsule (20 mg total) by mouth daily. 30 capsule 5  . ondansetron (ZOFRAN) 8 MG tablet Take 1 tablet (8 mg total) by mouth every 8 (eight) hours as needed. Start on the third day after chemotherapy. 30 tablet 1  . oxyCODONE-acetaminophen (PERCOCET) 10-325 MG tablet Take 1 tablet by mouth every 4 (four) hours as needed for pain. 60 tablet 0  . potassium chloride SA (K-DUR,KLOR-CON) 20 MEQ tablet Take 2 tablets (40 mEq total) by mouth 2 (two) times daily. 60 tablet 1  . prochlorperazine (COMPAZINE) 10 MG tablet Take 1  tablet (10 mg total) by mouth every 6 (six) hours as needed (Nausea or vomiting). 30 tablet 1  . promethazine (PHENERGAN) 25 MG tablet Take 0.5 tablets (12.5 mg total) by mouth every 6 (six) hours as needed for nausea or vomiting. 30 tablet 1  . triamterene-hydrochlorothiazide (MAXZIDE-25) 37.5-25 MG tablet Take 1 tablet by mouth daily.  1   No current facility-administered medications on file prior to visit.      PAST SURGICAL HISTORY Past Surgical History:  Procedure Laterality Date  . ABDOMINAL HYSTERECTOMY    . APPENDECTOMY    . INNER EAR SURGERY Right   . PORTACATH PLACEMENT N/A 04/12/2016   Procedure: INSERTION PORT-A-CATH WITH [place];  Surgeon: Rolm Bookbinder, MD;  Location: [facility];  Service: General;  Laterality: N/A;    FAMILY HISTORY: She reports a family history of cancer:  Bother deceased at the age of 46 secondary to colon cancer  Brother is survivor of bladder cancer  Sister is survivor with history of colon cancer requiring colectomy.  1 son is deceased at the age of 19 secondary to complications with cerebral palsy 1 son alive, living in [place].  He is a Curator. 1 Grandson 1 Great-granddaughter.  SOCIAL HISTORY:  reports that she has been smoking Cigarettes.  She has a 108.00 pack-year smoking history. She has never used smokeless tobacco. She reports  that she does not drink alcohol or use drugs.   She is Psychologist, forensic in religion.  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  She is married x 24 years to current husband who is a cancer survivor himself of head and neck cancer treated by Dr. Isidore Moos (XRT) and Dr. Alvy Bimler (Medical Oncology); currently 1.5 year survivor.  Social History   Social History  . Marital status: Married    Spouse name: N/A  . Number of children: N/A  . Years of education: N/A   Social History Main Topics  . Smoking status: Current Every Day Smoker    Packs/day: 2.00    Years: 54.00    Types: Cigarettes  . Smokeless tobacco: Never Used  . Alcohol use No  . Drug use: No  . Sexual activity: Not Asked     Comment: hysterectomy   Other Topics Concern  . None   Social History Narrative  . None   PERFORMANCE STATUS: The patient's performance status is 1 - Symptomatic but completely ambulatory  PHYSICAL EXAM: Most Recent Vital Signs:   Physical Exam  Constitutional: She is oriented to person, place, and time and well-developed, well-nourished, and in no distress.  alopecia  HENT:  Head: Normocephalic and atraumatic.  Mouth/Throat: Oropharynx is clear and moist.  Eyes: Conjunctivae and EOM are normal. Pupils are equal, round, and reactive to light.  Neck: Normal range of motion. Neck supple.  Cardiovascular: Normal rate, regular rhythm and normal heart sounds.   Pulmonary/Chest: Effort normal and breath sounds normal.  chemoport on right chest wall C/D/I  Abdominal: Soft. Bowel sounds are normal.  Musculoskeletal: Normal range of motion.  Neurological: She is alert and oriented to person, place, and time. Gait normal.  Skin: Skin is warm and dry.  Nursing note and vitals reviewed.  LABORATORY DATA:  I have reviewed the data as listed. CBC Latest Ref Rng & Units 08/15/2016 08/08/2016 08/01/2016  WBC 4.0 - 10.5 K/uL 5.7 4.9 5.8  Hemoglobin 12.0 - 15.0 g/dL 11.3(L) 11.1(L) 11.1(L)  Hematocrit 36.0 -  46.0 % 32.6(L) 32.2(L) 31.8(L)  Platelets 150 -  400 K/uL 211 232 222   CMP Latest Ref Rng & Units 08/15/2016 08/08/2016 08/01/2016  Glucose 65 - 99 mg/dL 128(H) 109(H) 142(H)  BUN 6 - 20 mg/dL '13 13 14  ' Creatinine 0.44 - 1.00 mg/dL 0.77 0.81 0.89  Sodium 135 - 145 mmol/L 133(L) 131(L) 131(L)  Potassium 3.5 - 5.1 mmol/L 3.7 3.6 3.6  Chloride 101 - 111 mmol/L 101 100(L) 98(L)  CO2 22 - 32 mmol/L '25 24 24  ' Calcium 8.9 - 10.3 mg/dL 9.5 8.9 9.3  Total Protein 6.5 - 8.1 g/dL 6.4(L) 6.6 6.7  Total Bilirubin 0.3 - 1.2 mg/dL 0.6 0.7 0.5  Alkaline Phos 38 - 126 U/L 76 79 86  AST 15 - 41 U/L 35 36 29  ALT 14 - 54 U/L 31 34 19   RADIOGRAPHY: I have reviewed the data as listed below and agree with the findings.  DG Chest 2 View 06/13/2016 IMPRESSION: No active cardiopulmonary disease.  PATHOLOGY:     ASSESSMENT/PLAN:  Invasive ductal carcinoma of breast, female, right upper outer quadrant ER/PR +, HER2 NEGATIVE  Right axillary lymph node positive for disease (R axillary mets) Tobacco Abuse Hepatic Steatosis Port placement  Labs reviewed. Continue with cycle 7 of taxol. Prescribe Gabapentin for her neuropathy.   Patient asked for more narcotic pain medications. However she just had her percocet refilled on 08/08/16, and I have told her that I cannot give her anymore at this time.   I encouraged her to cut back on smoking. She is at 2 ppd currently.   She will return for follow up in 2 weeks   All questions were answered. The patient knows to call the clinic with any problems, questions or concerns. We can certainly see the patient much sooner if necessary.  This document serves as a record of services personally performed by Twana First, MD. It was created on her behalf by Martinique Casey, a trained medical scribe. The creation of this record is based on the scribe's personal observations and the provider's statements to them. This document has been checked and approved by the attending  provider.  I have reviewed the above documentation for accuracy and completeness and I agree with the above.  This note is electronically signed by: Martinique M Casey   08/22/2016 12:05 PM

## 2016-08-22 NOTE — Progress Notes (Signed)
Ashley Doyle is here today for her next cycle of Taxol.  Patient spoke with nursing and requested to see me today re: her pain management.   We had lengthy discussion in a private room while patient received chemotherapy in treatment area.  She is upset because she does not feel her pain is well-managed with her current regimen/"daily allowance" of Percocet.  She tells me that she tries to only take 2 tabs/day, but 3 tabs generally works better.  Her pain is generalized and in her joints.  At her last follow-up visit, we discussed trying OTC Aleve, along with her opiates.  She did not try this recommendation "because I've seen all this stuff on TV that said that Aleve is bad for you."  She expresses frustration "because I can't get the medicine I need because other people are addicted to pain pills."  I provided support by active listening, offering understanding of her concerns, and expressive supportive counseling.    I explained the purpose of combined use of NSAIDs with opiates for the management of her pain.  I shared that I do not feel comfortable going up on the dosage of her Percocet, when she has not tried combination NSAID/opiate therapy, which is often very effective.  I provided reassurance that short-course NSAIDs, even in the setting of her previous heart disease/strokes, is generally considered to be safe and well-tolerated.  I also discussed this plan with Dr. Talbert Cage, who saw and evaluated the patient today during an office visit today.    -Percocet 10/325 po Q6Hprn, #90, no refills. This will be new pain regimen prescription and one-month's supply (when pt needs refill); no prescription provided today as patient has several pills left on previous prescription.   -Pills in current medicine bottle were counted and there were 23 left in bottle.  -Instructed her to begin taking Percocet 3 times/day PRN, along with Aleve 2 tabs once per day. She knows to contact our office when she needs a new  prescription.    Ashley Doyle and I came to the following agreement for her pain regimen. She understands that I am not willing to prescribe more than what is documented below, as she completes her chemotherapy.  Her pain is chronic and pre-dates her cancer diagnosis; however, there is data that suggests the chemotherapy can worsen already present arthralgias/arthritis.  Therefore, I am willing to provide her pain medication prescriptions while she completes treatment, then she will need referral to a pain specialist or have her PCP resume care of her chronic pain needs.     Stressed the importance of adequate bowel regimen with an increase in pain medications.     Mike Craze, NP [place] [phone]

## 2016-08-22 NOTE — Patient Instructions (Addendum)
[facility] Discharge Instructions for Patients Receiving Chemotherapy   Beginning January 23rd 2017 lab work for the [facility] will be done in the  Main lab at [facility] on 1st floor. If you have a lab appointment with the [facility] please come in thru the  Main Entrance and check in at the main information desk  Call office before you run out of pain medications. Take 2 aleve every morning for two weeks.  Today you received the following chemotherapy agents  To help prevent nausea and vomiting after your treatment, we encourage you to take your nausea medication     If you develop nausea and vomiting, or diarrhea that is not controlled by your medication, call the clinic.  The clinic phone number is [phone]. Office hours are Monday-Friday 8:30am-5:00pm.  BELOW ARE SYMPTOMS THAT SHOULD BE REPORTED IMMEDIATELY:  *FEVER GREATER THAN 101.0 F  *CHILLS WITH OR WITHOUT FEVER  NAUSEA AND VOMITING THAT IS NOT CONTROLLED WITH YOUR NAUSEA MEDICATION  *UNUSUAL SHORTNESS OF BREATH  *UNUSUAL BRUISING OR BLEEDING  TENDERNESS IN MOUTH AND THROAT WITH OR WITHOUT PRESENCE OF ULCERS  *URINARY PROBLEMS  *BOWEL PROBLEMS  UNUSUAL RASH Items with * indicate a potential emergency and should be followed up as soon as possible. If you have an emergency after office hours please contact your primary care physician or go to the nearest emergency department.  Please call the clinic during office hours if you have any questions or concerns.   You may also contact the Patient Navigator at [phone] should you have any questions or need assistance in obtaining follow up care.      Resources For Cancer Patients and their Caregivers ? American Cancer Society: Can assist with transportation, wigs, general needs, runs Look Good Feel Better.        (725)651-7516 ? Cancer Care: Provides financial assistance, online support groups, medication/co-pay assistance.   1-800-813-HOPE (774)136-2792) ? [facility] Assists [place] cancer patients and their families through emotional , educational and financial support.  970-814-8171 ? [place] DSS Where to apply for food stamps, Medicaid and utility assistance. 272-835-9421 ? RCATS: Transportation to medical appointments. (813)074-5981 ? Social Security Administration: May apply for disability if have a Stage IV cancer. 339-336-4173 (409)054-2799 ? [employer], Disability and Transit Services: Assists with nutrition, care and transit needs. 617-621-4430

## 2016-08-22 NOTE — Progress Notes (Signed)
Labs reviewed with MD. Proceed with treatment.  Chemotherapy given today per orders. Patient tolerated it well, no issues. Vitals stable and discharged home from clinic ambulatory. Follow up as scheduled.

## 2016-08-23 ENCOUNTER — Ambulatory Visit (HOSPITAL_COMMUNITY): Payer: Medicare HMO | Admitting: Oncology

## 2016-08-23 ENCOUNTER — Inpatient Hospital Stay (HOSPITAL_COMMUNITY): Payer: Medicare HMO

## 2016-08-27 ENCOUNTER — Other Ambulatory Visit (HOSPITAL_COMMUNITY): Payer: Self-pay | Admitting: Adult Health

## 2016-08-27 DIAGNOSIS — E876 Hypokalemia: Secondary | ICD-10-CM

## 2016-08-27 DIAGNOSIS — C50911 Malignant neoplasm of unspecified site of right female breast: Secondary | ICD-10-CM

## 2016-08-28 ENCOUNTER — Other Ambulatory Visit (HOSPITAL_COMMUNITY): Payer: Self-pay | Admitting: Emergency Medicine

## 2016-08-28 ENCOUNTER — Inpatient Hospital Stay (HOSPITAL_COMMUNITY): Payer: Medicare HMO

## 2016-08-28 MED ORDER — OXYCODONE-ACETAMINOPHEN 10-325 MG PO TABS: 1.0000 | ORAL_TABLET | ORAL | 0 refills | Status: AC | PRN

## 2016-08-28 NOTE — Progress Notes (Signed)
mildreds oxycodone refilled with 90 pills for a 1 month supply.

## 2016-08-29 ENCOUNTER — Encounter (HOSPITAL_COMMUNITY): Payer: Medicare HMO | Attending: Oncology

## 2016-08-29 VITALS — BP 141/69 | HR 82 | Temp 97.5°F | Resp 18

## 2016-08-29 DIAGNOSIS — C50911 Malignant neoplasm of unspecified site of right female breast: Secondary | ICD-10-CM | POA: Diagnosis not present

## 2016-08-29 DIAGNOSIS — C773 Secondary and unspecified malignant neoplasm of axilla and upper limb lymph nodes: Secondary | ICD-10-CM | POA: Diagnosis not present

## 2016-08-29 DIAGNOSIS — C50411 Malignant neoplasm of upper-outer quadrant of right female breast: Secondary | ICD-10-CM | POA: Diagnosis not present

## 2016-08-29 DIAGNOSIS — Z5111 Encounter for antineoplastic chemotherapy: Secondary | ICD-10-CM | POA: Diagnosis not present

## 2016-08-29 LAB — COMPREHENSIVE METABOLIC PANEL
ALK PHOS: 75 U/L (ref 38–126)
ALT: 24 U/L (ref 14–54)
AST: 23 U/L (ref 15–41)
Albumin: 3.9 g/dL (ref 3.5–5.0)
Anion gap: 8 (ref 5–15)
BILIRUBIN TOTAL: 0.5 mg/dL (ref 0.3–1.2)
BUN: 9 mg/dL (ref 6–20)
CALCIUM: 8.9 mg/dL (ref 8.9–10.3)
CO2: 23 mmol/L (ref 22–32)
CREATININE: 0.84 mg/dL (ref 0.44–1.00)
Chloride: 101 mmol/L (ref 101–111)
Glucose, Bld: 95 mg/dL (ref 65–99)
Potassium: 4.1 mmol/L (ref 3.5–5.1)
Sodium: 132 mmol/L — ABNORMAL LOW (ref 135–145)
TOTAL PROTEIN: 6.2 g/dL — AB (ref 6.5–8.1)

## 2016-08-29 LAB — CBC WITH DIFFERENTIAL/PLATELET
BASOS ABS: 0 10*3/uL (ref 0.0–0.1)
Basophils Relative: 1 %
EOS PCT: 3 %
Eosinophils Absolute: 0.2 10*3/uL (ref 0.0–0.7)
HEMATOCRIT: 31.7 % — AB (ref 36.0–46.0)
Hemoglobin: 11.4 g/dL — ABNORMAL LOW (ref 12.0–15.0)
LYMPHS ABS: 1.4 10*3/uL (ref 0.7–4.0)
LYMPHS PCT: 22 %
MCH: 34.2 pg — AB (ref 26.0–34.0)
MCHC: 36 g/dL (ref 30.0–36.0)
MCV: 95.2 fL (ref 78.0–100.0)
Monocytes Absolute: 0.4 10*3/uL (ref 0.1–1.0)
Monocytes Relative: 6 %
NEUTROS ABS: 4.3 10*3/uL (ref 1.7–7.7)
Neutrophils Relative %: 68 %
PLATELETS: 230 10*3/uL (ref 150–400)
RBC: 3.33 MIL/uL — AB (ref 3.87–5.11)
RDW: 18.4 % — ABNORMAL HIGH (ref 11.5–15.5)
WBC: 6.3 10*3/uL (ref 4.0–10.5)

## 2016-08-29 MED ORDER — SODIUM CHLORIDE 0.9 % IV SOLN
20.0000 mg | Freq: Once | INTRAVENOUS | Status: AC
  Administered 2016-08-29: 20 mg via INTRAVENOUS
  Filled 2016-08-29: qty 2

## 2016-08-29 MED ORDER — SODIUM CHLORIDE 0.9 % IV SOLN
Freq: Once | INTRAVENOUS | Status: AC
Start: 2016-08-29 — End: 2016-08-29
  Administered 2016-08-29: 14:00:00 via INTRAVENOUS

## 2016-08-29 MED ORDER — DIPHENHYDRAMINE HCL 50 MG/ML IJ SOLN
INTRAMUSCULAR | Status: AC
  Filled 2016-08-29: qty 1

## 2016-08-29 MED ORDER — PACLITAXEL CHEMO INJECTION 300 MG/50ML
80.0000 mg/m2 | Freq: Once | INTRAVENOUS | Status: AC
  Administered 2016-08-29: 144 mg via INTRAVENOUS
  Filled 2016-08-29: qty 24

## 2016-08-29 MED ORDER — HEPARIN SOD (PORK) LOCK FLUSH 100 UNIT/ML IV SOLN
500.0000 [IU] | Freq: Once | INTRAVENOUS | Status: AC | PRN
  Administered 2016-08-29: 500 [IU]

## 2016-08-29 MED ORDER — FAMOTIDINE IN NACL 20-0.9 MG/50ML-% IV SOLN
20.0000 mg | Freq: Once | INTRAVENOUS | Status: AC
  Administered 2016-08-29: 20 mg via INTRAVENOUS

## 2016-08-29 MED ORDER — DIPHENHYDRAMINE HCL 50 MG/ML IJ SOLN
50.0000 mg | Freq: Once | INTRAMUSCULAR | Status: AC
Start: 2016-08-29 — End: 2016-08-29
  Administered 2016-08-29: 50 mg via INTRAVENOUS

## 2016-08-29 MED ORDER — FAMOTIDINE IN NACL 20-0.9 MG/50ML-% IV SOLN
INTRAVENOUS | Status: AC
  Filled 2016-08-29: qty 50

## 2016-08-29 NOTE — Progress Notes (Signed)
Tolerated chemo well. Stable and ambulatory on discharge home with husband. 

## 2016-08-29 NOTE — Patient Instructions (Signed)
[facility] Discharge Instructions for Patients Receiving Chemotherapy   Beginning January 23rd 2017 lab work for the [facility] will be done in the  Main lab at [facility] on 1st floor. If you have a lab appointment with the [facility] please come in thru the  Main Entrance and check in at the main information desk   Today you received the following chemotherapy agents taxol.  If you develop nausea and vomiting, or diarrhea that is not controlled by your medication, call the clinic.  The clinic phone number is [phone]. Office hours are Monday-Friday 8:30am-5:00pm.  BELOW ARE SYMPTOMS THAT SHOULD BE REPORTED IMMEDIATELY:  *FEVER GREATER THAN 101.0 F  *CHILLS WITH OR WITHOUT FEVER  NAUSEA AND VOMITING THAT IS NOT CONTROLLED WITH YOUR NAUSEA MEDICATION  *UNUSUAL SHORTNESS OF BREATH  *UNUSUAL BRUISING OR BLEEDING  TENDERNESS IN MOUTH AND THROAT WITH OR WITHOUT PRESENCE OF ULCERS  *URINARY PROBLEMS  *BOWEL PROBLEMS  UNUSUAL RASH Items with * indicate a potential emergency and should be followed up as soon as possible. If you have an emergency after office hours please contact your primary care physician or go to the nearest emergency department.  Please call the clinic during office hours if you have any questions or concerns.   You may also contact the Patient Navigator at [phone] should you have any questions or need assistance in obtaining follow up care.      Resources For Cancer Patients and their Caregivers ? American Cancer Society: Can assist with transportation, wigs, general needs, runs Look Good Feel Better.        620-570-5780 ? Cancer Care: Provides financial assistance, online support groups, medication/co-pay assistance.  1-800-813-HOPE 870-664-8522) ? [facility] Assists [place] cancer patients and their families through emotional , educational and financial support.  575 017 4465 ? [place] DSS Where to apply for food stamps, Medicaid and utility assistance. 201-459-7582 ? RCATS: Transportation to medical appointments. 878-376-1747 ? Social Security Administration: May apply for disability if have a Stage IV cancer. ([phone] ? [employer], Disability and Transit Services: Assists with nutrition, care and transit needs. [phone]

## 2016-09-05 ENCOUNTER — Encounter (HOSPITAL_BASED_OUTPATIENT_CLINIC_OR_DEPARTMENT_OTHER): Payer: Medicare HMO | Admitting: Oncology

## 2016-09-05 ENCOUNTER — Other Ambulatory Visit (HOSPITAL_COMMUNITY): Payer: Self-pay | Admitting: Oncology

## 2016-09-05 ENCOUNTER — Encounter (HOSPITAL_COMMUNITY): Payer: Medicare HMO | Attending: Oncology

## 2016-09-05 ENCOUNTER — Encounter (HOSPITAL_COMMUNITY): Payer: Self-pay | Admitting: Oncology

## 2016-09-05 ENCOUNTER — Ambulatory Visit (HOSPITAL_COMMUNITY): Payer: Medicare HMO | Admitting: Oncology

## 2016-09-05 VITALS — BP 119/58 | HR 87 | Temp 98.6°F | Resp 18 | Wt 169.4 lb

## 2016-09-05 DIAGNOSIS — C773 Secondary and unspecified malignant neoplasm of axilla and upper limb lymph nodes: Secondary | ICD-10-CM

## 2016-09-05 DIAGNOSIS — Z170 Estrogen receptor positive status [ER+]: Secondary | ICD-10-CM

## 2016-09-05 DIAGNOSIS — C50411 Malignant neoplasm of upper-outer quadrant of right female breast: Secondary | ICD-10-CM

## 2016-09-05 DIAGNOSIS — C50911 Malignant neoplasm of unspecified site of right female breast: Secondary | ICD-10-CM | POA: Diagnosis not present

## 2016-09-05 DIAGNOSIS — Z5111 Encounter for antineoplastic chemotherapy: Secondary | ICD-10-CM

## 2016-09-05 LAB — CBC WITH DIFFERENTIAL/PLATELET
BASOS PCT: 1 %
Basophils Absolute: 0 10*3/uL (ref 0.0–0.1)
EOS ABS: 0.2 10*3/uL (ref 0.0–0.7)
EOS PCT: 3 %
HCT: 33.1 % — ABNORMAL LOW (ref 36.0–46.0)
HEMOGLOBIN: 11.7 g/dL — AB (ref 12.0–15.0)
Lymphocytes Relative: 19 %
Lymphs Abs: 1.3 10*3/uL (ref 0.7–4.0)
MCH: 33.5 pg (ref 26.0–34.0)
MCHC: 35.3 g/dL (ref 30.0–36.0)
MCV: 94.8 fL (ref 78.0–100.0)
MONOS PCT: 9 %
Monocytes Absolute: 0.6 10*3/uL (ref 0.1–1.0)
NEUTROS PCT: 68 %
Neutro Abs: 4.5 10*3/uL (ref 1.7–7.7)
PLATELETS: 227 10*3/uL (ref 150–400)
RBC: 3.49 MIL/uL — ABNORMAL LOW (ref 3.87–5.11)
RDW: 18.7 % — AB (ref 11.5–15.5)
WBC: 6.6 10*3/uL (ref 4.0–10.5)

## 2016-09-05 LAB — COMPREHENSIVE METABOLIC PANEL
ALBUMIN: 3.9 g/dL (ref 3.5–5.0)
ALT: 26 U/L (ref 14–54)
ANION GAP: 9 (ref 5–15)
AST: 24 U/L (ref 15–41)
Alkaline Phosphatase: 71 U/L (ref 38–126)
BILIRUBIN TOTAL: 0.6 mg/dL (ref 0.3–1.2)
BUN: 8 mg/dL (ref 6–20)
CHLORIDE: 100 mmol/L — AB (ref 101–111)
CO2: 25 mmol/L (ref 22–32)
Calcium: 9.2 mg/dL (ref 8.9–10.3)
Creatinine, Ser: 0.84 mg/dL (ref 0.44–1.00)
GFR calc Af Amer: >60 mL/min (ref >60)
GFR calc non Af Amer: >60 mL/min (ref >60)
GLUCOSE: 93 mg/dL (ref 65–99)
POTASSIUM: 3.7 mmol/L (ref 3.5–5.1)
Sodium: 134 mmol/L — ABNORMAL LOW (ref 135–145)
TOTAL PROTEIN: 6.1 g/dL — AB (ref 6.5–8.1)

## 2016-09-05 MED ORDER — FAMOTIDINE IN NACL 20-0.9 MG/50ML-% IV SOLN
INTRAVENOUS | Status: AC
  Filled 2016-09-05: qty 50

## 2016-09-05 MED ORDER — DIPHENHYDRAMINE HCL 50 MG/ML IJ SOLN
INTRAMUSCULAR | Status: AC
  Filled 2016-09-05: qty 1

## 2016-09-05 MED ORDER — PACLITAXEL CHEMO INJECTION 300 MG/50ML
80.0000 mg/m2 | Freq: Once | INTRAVENOUS | Status: AC
  Administered 2016-09-05: 144 mg via INTRAVENOUS
  Filled 2016-09-05: qty 24

## 2016-09-05 MED ORDER — SODIUM CHLORIDE 0.9% FLUSH
10.0000 mL | INTRAVENOUS | Status: DC | PRN
  Administered 2016-09-05: 10 mL
  Filled 2016-09-05: qty 10

## 2016-09-05 MED ORDER — FAMOTIDINE IN NACL 20-0.9 MG/50ML-% IV SOLN
20.0000 mg | Freq: Once | INTRAVENOUS | Status: AC
  Administered 2016-09-05: 20 mg via INTRAVENOUS

## 2016-09-05 MED ORDER — HEPARIN SOD (PORK) LOCK FLUSH 100 UNIT/ML IV SOLN
500.0000 [IU] | Freq: Once | INTRAVENOUS | Status: AC | PRN
  Administered 2016-09-05: 500 [IU]
  Filled 2016-09-05: qty 5

## 2016-09-05 MED ORDER — SODIUM CHLORIDE 0.9 % IV SOLN
20.0000 mg | Freq: Once | INTRAVENOUS | Status: AC
  Administered 2016-09-05: 20 mg via INTRAVENOUS
  Filled 2016-09-05: qty 2

## 2016-09-05 MED ORDER — DIPHENHYDRAMINE HCL 50 MG/ML IJ SOLN
50.0000 mg | Freq: Once | INTRAMUSCULAR | Status: AC
  Administered 2016-09-05: 50 mg via INTRAVENOUS

## 2016-09-05 MED ORDER — SODIUM CHLORIDE 0.9 % IV SOLN
Freq: Once | INTRAVENOUS | Status: AC
  Administered 2016-09-05: 14:00:00 via INTRAVENOUS

## 2016-09-05 MED ORDER — LIDOCAINE-PRILOCAINE 2.5-2.5 % EX CREA: TOPICAL_CREAM | CUTANEOUS | 3 refills | Status: DC

## 2016-09-05 NOTE — Patient Instructions (Signed)
[facility] Discharge Instructions for Patients Receiving Chemotherapy   Beginning January 23rd 2017 lab work for the Cancer Center will be done in the  Main lab at [place] on 1st floor. If you have a lab appointment with the Cancer Center please come in thru the  Main Entrance and check in at the main information desk   Today you received the following chemotherapy agents Taxol. Follow-up as scheduled. Call clinic for any questions or concerns  To help prevent nausea and vomiting after your treatment, we encourage you to take your nausea medication   If you develop nausea and vomiting, or diarrhea that is not controlled by your medication, call the clinic.  The clinic phone number is [phone]. Office hours are Monday-Friday 8:30am-5:00pm.  BELOW ARE SYMPTOMS THAT SHOULD BE REPORTED IMMEDIATELY:  *FEVER GREATER THAN 101.0 F  *CHILLS WITH OR WITHOUT FEVER  NAUSEA AND VOMITING THAT IS NOT CONTROLLED WITH YOUR NAUSEA MEDICATION  *UNUSUAL SHORTNESS OF BREATH  *UNUSUAL BRUISING OR BLEEDING  TENDERNESS IN MOUTH AND THROAT WITH OR WITHOUT PRESENCE OF ULCERS  *URINARY PROBLEMS  *BOWEL PROBLEMS  UNUSUAL RASH Items with * indicate a potential emergency and should be followed up as soon as possible. If you have an emergency after office hours please contact your primary care physician or go to the nearest emergency department.  Please call the clinic during office hours if you have any questions or concerns.   You may also contact the Patient Navigator at [phone] should you have any questions or need assistance in obtaining follow up care.      Resources For Cancer Patients and their Caregivers ? American Cancer Society: Can assist with transportation, wigs, general needs, runs Look Good Feel Better.        1-888-227-6333 ? Cancer Care: Provides financial assistance, online support groups, medication/co-pay assistance.  1-800-813-HOPE  (4673) ? [facility] Assists [place] cancer patients and their families through emotional , educational and financial support.  336-427-4357 ? [place] DSS Where to apply for food stamps, Medicaid and utility assistance. 336-342-1394 ? RC[facility]: Transportation to medical appointments. 336-347-2287 ? Social Security Administration: May apply for disability if have a Stage IV cancer. 336-342-7796 1-800-772-1213 ? [place] Aging, Disability and Transit Services: Assists with nutrition, care and transit needs. 336-349-2343         

## 2016-09-05 NOTE — Progress Notes (Signed)
Ashley Doyle tolerated chemo tx well without complaints or incident. Labs reviewed with Dr. Talbert Cage prior to administering chemotherapy. VSS upon discharge. Pt discharged self ambulatory in satisfactory condition accompanied by her husband

## 2016-09-05 NOTE — Patient Instructions (Addendum)
[place] at [facility] Discharge Instructions  RECOMMENDATIONS MADE BY THE CONSULTANT AND ANY TEST RESULTS WILL BE SENT TO YOUR REFERRING PHYSICIAN.  You were seen today by Kirby Crigler PA-C. Return next week for cycle 10. Return in 2 weeks for follow up and treatment.    Thank you for choosing [place] at [facility] to provide your oncology and hematology care.  To afford each patient quality time with our provider, please arrive at least 15 minutes before your scheduled appointment time.    If you have a lab appointment with the [facility] please come in thru the  Main Entrance and check in at the main information desk  You need to re-schedule your appointment should you arrive 10 or more minutes late.  We strive to give you quality time with our providers, and arriving late affects you and other patients whose appointments are after yours.  Also, if you no show three or more times for appointments you may be dismissed from the clinic at the providers discretion.     Again, thank you for choosing [facility].  Our hope is that these requests will decrease the amount of time that you wait before being seen by our physicians.       _____________________________________________________________  Should you have questions after your visit to [facility], please contact our office at [phone] between the hours of 8:30 a.m. and 4:30 p.m.  Voicemails left after 4:30 p.m. will not be returned until the following business day.  For prescription refill requests, have your pharmacy contact our office.       Resources For Cancer Patients and their Caregivers ? American Cancer Society: Can assist with transportation, wigs, general needs, runs Look Good Feel Better.        (567)574-1421 ? Cancer Care: Provides financial assistance, online support groups, medication/co-pay assistance.  1-800-813-HOPE  2190874731) ? [facility] Assists [place] cancer patients and their families through emotional , educational and financial support.  306-131-4368 ? [place] DSS Where to apply for food stamps, Medicaid and utility assistance. 928 828 6228 ? RCATS: Transportation to medical appointments. 959-090-0399 ? Social Security Administration: May apply for disability if have a Stage IV cancer. 570-186-1331 860-674-3532 ? [employer], Disability and Transit Services: Assists with nutrition, care and transit needs. [facility] Support Programs: @10RELATIVEDAYS @ > Cancer Support Group  2nd Tuesday of the month 1pm-2pm, Journey Room  > Creative Journey  3rd Tuesday of the month 1130am-1pm, Journey Room  > Look Good Feel Better  1st Wednesday of the month 10am-12 noon, Journey Room (Call [place] to register [phone])

## 2016-09-05 NOTE — Assessment & Plan Note (Addendum)
Invasive ductal carcinoma of right breast ER/PR +, HER2 NEGATIVE, with right axillary lymph node positive for disease. Currently undergoing neoadjuvant chemotherapy consisting of AC followed by T beginning on 04/16/2016.  Her surgeon is Dr. Donne Hazel in [place].  Oncology history updated.  Pretreatment labs today: CBC diff, CMET.  I personally reviewed and went over laboratory results with the patient.  The results are noted within this dictation.  Treatment today, Paclitaxel.  We discussed Taxol-specific side effects including myelosuppression with dose limiting neutropenia nadir at day 8-10 and recovery by day 15-21, hypersensitivity reaction occuring in 20-40% of patients, neurotoxicity mainly in the form of sensory neuropathy with numbness and paresthesias, transient asymptomatic bradycardia is the most commonly observed cardiotoxicity, alopecia occurs in nearly all patients with total body hair loss, mucositis and/or diarrhea in 30-40% of patients, transient elevations in serum transaminases, bilirubin, and alkaline phophatase, and onycholysis which is mainly observed in those receiving >6 courses on a weekly schedule and not typical in those receiving paclitaxel on an every 3 week schedule.  She denies any new issues today.    Will refer patient back to Dr. Donne Hazel in preparation for surgical evaluation following chemotherapy completion.  She has opted for mastectomy due to her concerns about daily transportation to XRT.  She is advised that she may still need XRT despite mastectomy.  Pathology will help make that determination.  Problem list reviewed with patient and edited accordingly.  Medications are reviewed with the patient and edited accordingly.  Return next week for cycle #10 of Paclitaxel.  Return in 2 weeks for follow-up and cycle #11.    More than 50% of the time spent with the patient was utilized for counseling and coordination of care.  Addendum: Case discussed with Dr.  Donne Hazel.  He would like MRI breast performed after chemotherapy is completed.  MRI breast is ordered and will be performed 1 week after completion of chemotherapy.  He will see her shortly thereafter.

## 2016-09-05 NOTE — Addendum Note (Signed)
Addended by: [facility] on: 09/05/2016 04:04 PM   Modules accepted: Orders

## 2016-09-05 NOTE — Progress Notes (Addendum)
Wende Neighbors, MD [place] [address]  Invasive ductal carcinoma of breast, female, right [facility]) - Plan: MR Breast Right W Wo Contrast  CURRENT THERAPY: AC every 2 weeks beginning on 04/16/2016 with Neulasta support x 4 cycles followed by Paclitaxel weekly x 12.  INTERVAL HISTORY: Ashley Doyle 71 y.o. female returns for followup of  Invasive ductal carcinoma of right breast ER/PR +, HER2 NEGATIVE, with right axillary lymph node positive for disease.    Invasive ductal carcinoma of breast, female, right ([place])   02/21/2016 Mammogram    Irregular vascular mass in the right breast at 10 o'clock, 10 cm the nipple, measuring 2.9 x 2.5 x 2.6 cm. In the right axilla there is 3 discrete enlarged abnormal appearing lymph nodes. Largest, which lies lateral to the palpable mass, measures 1.9 x 1.3 x 1.9 cm.      02/28/2016 Procedure    Right needle core biopsy in 10:00 position and right axillary biopsy      02/28/2016 Procedure    Postprocedure mammogram for clip placement. Ribbon shaped clip well-positioned within the biopsied mass in the right breast at the 10 o'clock axis. Wing shaped clip is well-positioned within the biopsied lymph node as seen on postprocedure ultrasound evaluation.  Final Assessment: Post Procedure Mammograms for Marker Placement      03/01/2016 Pathology Results    Invasive ductal carcinoma with extracellular mucin of right breast mass and ductal carcinoma of right axillary lymph node.  Grade 2.  ER 100%, PR 100%, Ki-67 12%, HER2 NEGATIVE.      03/12/2016 Imaging    CT CAP- Right breast cancer with associated right axillary metastatic lymph nodes. 2. No evidence of distant metastatic disease. 3. Hepatic steatosis.      03/26/2016 Breast MRI    2.7 cm spiculated mass in the upper-outer quadrant of right breast corresponding with the invasive ductal carcinoma. Masses in the right axilla consistent with axillary adenopathy.      04/16/2016 -  Chemotherapy    The patient had DOXOrubicin (ADRIAMYCIN) chemo injection 116 mg, 60 mg/m2 = 116 mg, Intravenous,  Once, 4 of 4 cycles Dose modification: 50 mg/m2 (original dose 60 mg/m2, Cycle 4, Reason: Dose not tolerated)  palonosetron (ALOXI) injection 0.25 mg, 0.25 mg, Intravenous,  Once, 4 of 4 cycles  pegfilgrastim (NEULASTA ONPRO KIT) injection 6 mg, 6 mg, Subcutaneous, Once, 4 of 4 cycles  cyclophosphamide (CYTOXAN) 1,160 mg in sodium chloride 0.9 % 250 mL chemo infusion, 600 mg/m2 = 1,160 mg, Intravenous,  Once, 4 of 4 cycles Dose modification: 500 mg/m2 (original dose 600 mg/m2, Cycle 4, Reason: Dose not tolerated)  PACLitaxel (TAXOL) 156 mg in dextrose 5 % 250 mL chemo infusion ( fosaprepitant (EMEND) 150 mg, dexamethasone (DECADRON) 12 mg in sodium chloride 0.9 % 145 mL IVPB, , Intravenous,  Once, 4 of 4 cycles  for chemotherapy treatment.        05/28/2016 Adverse Reaction    Thrombocytopenia      05/28/2016 Treatment Plan Change    AC #4 delayed x 1 week      06/13/2016 Treatment Plan Change    Adriamycin d/c'd for cycle #4 due to concerns for cardiomyopathy/fluid volume overload.       06/14/2016 Echocardiogram    EF 60-65%      HPI Elements   Location: Right Breast  Quality: Invasive ductal carcinoma  Severity: Stage IIIA. 3 cm mass in right breast upper-outer quadrant  with involved right axillary lymphadenopathy.  Duration: Dx on 02/28/2016  Context:   Timing:   Modifying Factors: ER/PR positive.  HER2 negative.  Ongoing tobacco abuse  Associated Signs & Symptoms: Treatment course complicated by fatigue, arthralgias, unrealistic expectations, and poor compliance   She reports an appetite of 100%.  Energy level is rated at 75%.  She has chronic back pain and she rates this as a 4 out of 10.  She reports constipation.  Review of Systems  Constitutional: Negative.  Negative for chills, fever and weight loss.  HENT: Negative.   Eyes:  Negative.   Respiratory: Negative.  Negative for cough.   Cardiovascular: Negative.  Negative for chest pain.  Gastrointestinal: Positive for constipation. Negative for blood in stool, diarrhea, melena, nausea and vomiting.  Genitourinary: Negative.   Musculoskeletal: Positive for back pain (chronic).  Skin: Negative.   Neurological: Negative.  Negative for weakness.  Endo/Heme/Allergies: Negative.   Psychiatric/Behavioral: Negative.     Past Medical History:  Diagnosis Date  . Anxiety   . Arthritis   . Asthma   . Breast cancer ([place])   . COPD (chronic obstructive pulmonary disease) ([place])   . Depression   . Emphysema of lung ([place])   . Family history of adverse reaction to anesthesia    sister has PONV  . GERD (gastroesophageal reflux disease)   . HOH (hard of hearing)   . Hypertension   . Invasive ductal carcinoma of breast, female, right ([place]) 03/07/2016  . Pinched nerve in neck    and back  . Stroke ([place])   . Tuberculosis    "several years ago" 04/11/16  . Wears glasses   . Wears hearing aid    left ear    Past Surgical History:  Procedure Laterality Date  . ABDOMINAL HYSTERECTOMY    . APPENDECTOMY    . INNER EAR SURGERY Right   . PORTACATH PLACEMENT N/A 04/12/2016   Procedure: INSERTION PORT-A-CATH WITH [place];  Surgeon: Rolm Bookbinder, MD;  Location: [facility];  Service: General;  Laterality: N/A;    Family History  Problem Relation Age of Onset  . Stroke Mother   . Heart disease Mother   . Lung cancer Father     Social History   Social History  . Marital status: Married    Spouse name: N/A  . Number of children: N/A  . Years of education: N/A   Social History Main Topics  . Smoking status: Current Every Day Smoker    Packs/day: 2.00    Years: 54.00    Types: Cigarettes  . Smokeless tobacco: Never Used  . Alcohol use No  . Drug use: No  . Sexual activity: Not Asked     Comment: hysterectomy   Other Topics Concern  . None   Social History  Narrative  . None     PHYSICAL EXAMINATION  ECOG PERFORMANCE STATUS: 1 - Symptomatic but completely ambulatory  There were no vitals filed for this visit.  Vitals - 1 value per visit 12/06/2033  SYSTOLIC 597  DIASTOLIC 65  Pulse 95  Temperature 97.9  Respirations 18  Weight (lb) 169.4    GENERAL:alert, no distress, well nourished, well developed, comfortable, cooperative, obese, smiling and hard of hearing, smelling of tobacco smoke, accompanied by husband. SKIN: skin color, texture, turgor are normal, no rashes or significant lesions HEAD: Normocephalic, No masses, lesions, tenderness or abnormalities EYES: normal, EOMI EARS: External ears normal OROPHARYNX:lips, buccal mucosa, and tongue normal  NECK: supple, trachea  midline LYMPH:  As described in breast exam. BREAST:right breast with 2-2.5 cm upper-outer quadrant mass that is firm to palpation and tender to palpation without skin abnormality.  Right axillary node smaller measuring 1 cm in size clinically.  LUNGS: clear to auscultation  HEART: regular rate & rhythm ABDOMEN:abdomen soft, obese and normal bowel sounds BACK: Back symmetric, no curvature. EXTREMITIES:less then 2 second capillary refill, no joint deformities, effusion, or inflammation, no skin discoloration, no cyanosis  NEURO: alert & oriented x 3 with fluent speech, no focal motor/sensory deficits, gait normal   LABORATORY DATA: CBC    Component Value Date/Time   WBC 6.6 09/05/2016 1321   RBC 3.49 (L) 09/05/2016 1321   HGB 11.7 (L) 09/05/2016 1321   HCT 33.1 (L) 09/05/2016 1321   PLT 227 09/05/2016 1321   MCV 94.8 09/05/2016 1321   MCH 33.5 09/05/2016 1321   MCHC 35.3 09/05/2016 1321   RDW 18.7 (H) 09/05/2016 1321   LYMPHSABS 1.3 09/05/2016 1321   MONOABS 0.6 09/05/2016 1321   EOSABS 0.2 09/05/2016 1321   BASOSABS 0.0 09/05/2016 1321      Chemistry      Component Value Date/Time   NA 134 (L) 09/05/2016 1321   K 3.7 09/05/2016 1321   CL 100  (L) 09/05/2016 1321   CO2 25 09/05/2016 1321   BUN 8 09/05/2016 1321   CREATININE 0.84 09/05/2016 1321      Component Value Date/Time   CALCIUM 9.2 09/05/2016 1321   ALKPHOS 71 09/05/2016 1321   AST 24 09/05/2016 1321   ALT 26 09/05/2016 1321   BILITOT 0.6 09/05/2016 1321        PENDING LABS:   RADIOGRAPHIC STUDIES:  No results found.   PATHOLOGY:    ASSESSMENT AND PLAN:  Invasive ductal carcinoma of breast, female, right ([place]) Invasive ductal carcinoma of right breast ER/PR +, HER2 NEGATIVE, with right axillary lymph node positive for disease. Currently undergoing neoadjuvant chemotherapy consisting of AC followed by T beginning on 04/16/2016.  Her surgeon is Dr. Donne Hazel in [place].  Oncology history updated.  Pretreatment labs today: CBC diff, CMET.  I personally reviewed and went over laboratory results with the patient.  The results are noted within this dictation.  Treatment today, Paclitaxel.  We discussed Taxol-specific side effects including myelosuppression with dose limiting neutropenia nadir at day 8-10 and recovery by day 15-21, hypersensitivity reaction occuring in 20-40% of patients, neurotoxicity mainly in the form of sensory neuropathy with numbness and paresthesias, transient asymptomatic bradycardia is the most commonly observed cardiotoxicity, alopecia occurs in nearly all patients with total body hair loss, mucositis and/or diarrhea in 30-40% of patients, transient elevations in serum transaminases, bilirubin, and alkaline phophatase, and onycholysis which is mainly observed in those receiving >6 courses on a weekly schedule and not typical in those receiving paclitaxel on an every 3 week schedule.  She denies any new issues today.    Will refer patient back to Dr. Donne Hazel in preparation for surgical evaluation following chemotherapy completion.  She has opted for mastectomy due to her concerns about daily transportation to XRT.  She is advised that she may  still need XRT despite mastectomy.  Pathology will help make that determination.  Problem list reviewed with patient and edited accordingly.  Medications are reviewed with the patient and edited accordingly.  Return next week for cycle #10 of Paclitaxel.  Return in 2 weeks for follow-up and cycle #11.    More than 50% of the time spent  with the patient was utilized for counseling and coordination of care.  Addendum: Case discussed with Dr. Donne Hazel.  He would like MRI breast performed after chemotherapy is completed.  MRI breast is ordered and will be performed 1 week after completion of chemotherapy.  He will see her shortly thereafter.   ORDERS PLACED FOR THIS ENCOUNTER: Orders Placed This Encounter  Procedures  . MR Breast Right W Wo Contrast    MEDICATIONS PRESCRIBED THIS ENCOUNTER: No orders of the defined types were placed in this encounter.   THERAPY PLAN:  Continue with adjuvant chemotherapy as planned with curative intent followed definitive surgical treatment.  Dr. Donne Hazel is the patient's surgeon.  All questions were answered. The patient knows to call the clinic with any problems, questions or concerns. We can certainly see the patient much sooner if necessary.  Patient and plan discussed with Dr. Twana First and she is in agreement with the aforementioned.   This note is electronically signed by: Doy Mince 09/05/2016 4:04 PM

## 2016-09-06 ENCOUNTER — Encounter (HOSPITAL_COMMUNITY): Payer: Self-pay | Admitting: Oncology

## 2016-09-06 NOTE — Progress Notes (Unsigned)
Faxed referral/pt records to [facility] for referral to Dr Donne Hazel.

## 2016-09-10 ENCOUNTER — Other Ambulatory Visit (HOSPITAL_COMMUNITY): Payer: Self-pay | Admitting: Oncology

## 2016-09-10 DIAGNOSIS — C50911 Malignant neoplasm of unspecified site of right female breast: Secondary | ICD-10-CM

## 2016-09-12 ENCOUNTER — Encounter (HOSPITAL_COMMUNITY): Payer: Self-pay

## 2016-09-12 ENCOUNTER — Encounter (HOSPITAL_COMMUNITY): Payer: Medicare HMO | Attending: Oncology

## 2016-09-12 VITALS — BP 112/90 | HR 81 | Temp 98.5°F | Resp 18 | Wt 170.2 lb

## 2016-09-12 DIAGNOSIS — I10 Essential (primary) hypertension: Secondary | ICD-10-CM | POA: Insufficient documentation

## 2016-09-12 DIAGNOSIS — J439 Emphysema, unspecified: Secondary | ICD-10-CM | POA: Insufficient documentation

## 2016-09-12 DIAGNOSIS — K760 Fatty (change of) liver, not elsewhere classified: Secondary | ICD-10-CM | POA: Diagnosis not present

## 2016-09-12 DIAGNOSIS — Z8673 Personal history of transient ischemic attack (TIA), and cerebral infarction without residual deficits: Secondary | ICD-10-CM | POA: Diagnosis not present

## 2016-09-12 DIAGNOSIS — E785 Hyperlipidemia, unspecified: Secondary | ICD-10-CM | POA: Insufficient documentation

## 2016-09-12 DIAGNOSIS — C773 Secondary and unspecified malignant neoplasm of axilla and upper limb lymph nodes: Secondary | ICD-10-CM | POA: Diagnosis not present

## 2016-09-12 DIAGNOSIS — J449 Chronic obstructive pulmonary disease, unspecified: Secondary | ICD-10-CM | POA: Diagnosis not present

## 2016-09-12 DIAGNOSIS — C50911 Malignant neoplasm of unspecified site of right female breast: Secondary | ICD-10-CM

## 2016-09-12 DIAGNOSIS — M1990 Unspecified osteoarthritis, unspecified site: Secondary | ICD-10-CM | POA: Insufficient documentation

## 2016-09-12 DIAGNOSIS — F329 Major depressive disorder, single episode, unspecified: Secondary | ICD-10-CM | POA: Diagnosis not present

## 2016-09-12 DIAGNOSIS — F419 Anxiety disorder, unspecified: Secondary | ICD-10-CM | POA: Insufficient documentation

## 2016-09-12 DIAGNOSIS — F17210 Nicotine dependence, cigarettes, uncomplicated: Secondary | ICD-10-CM | POA: Diagnosis not present

## 2016-09-12 DIAGNOSIS — Z5111 Encounter for antineoplastic chemotherapy: Secondary | ICD-10-CM

## 2016-09-12 DIAGNOSIS — K219 Gastro-esophageal reflux disease without esophagitis: Secondary | ICD-10-CM | POA: Diagnosis not present

## 2016-09-12 DIAGNOSIS — C50411 Malignant neoplasm of upper-outer quadrant of right female breast: Secondary | ICD-10-CM

## 2016-09-12 LAB — CBC WITH DIFFERENTIAL/PLATELET
BASOS ABS: 0 10*3/uL (ref 0.0–0.1)
Basophils Relative: 1 %
EOS PCT: 4 %
Eosinophils Absolute: 0.2 10*3/uL (ref 0.0–0.7)
HEMATOCRIT: 31.4 % — AB (ref 36.0–46.0)
Hemoglobin: 11.1 g/dL — ABNORMAL LOW (ref 12.0–15.0)
Lymphocytes Relative: 22 %
Lymphs Abs: 1.2 10*3/uL (ref 0.7–4.0)
MCH: 33.2 pg (ref 26.0–34.0)
MCHC: 35.4 g/dL (ref 30.0–36.0)
MCV: 94 fL (ref 78.0–100.0)
MONO ABS: 0.4 10*3/uL (ref 0.1–1.0)
MONOS PCT: 8 %
NEUTROS ABS: 3.4 10*3/uL (ref 1.7–7.7)
NEUTROS PCT: 65 %
PLATELETS: 197 10*3/uL (ref 150–400)
RBC: 3.34 MIL/uL — ABNORMAL LOW (ref 3.87–5.11)
RDW: 18.7 % — AB (ref 11.5–15.5)
WBC: 5.3 10*3/uL (ref 4.0–10.5)

## 2016-09-12 LAB — COMPREHENSIVE METABOLIC PANEL
ALT: 30 U/L (ref 14–54)
ANION GAP: 8 (ref 5–15)
AST: 30 U/L (ref 15–41)
Albumin: 3.8 g/dL (ref 3.5–5.0)
Alkaline Phosphatase: 59 U/L (ref 38–126)
BILIRUBIN TOTAL: 0.8 mg/dL (ref 0.3–1.2)
BUN: 10 mg/dL (ref 6–20)
CHLORIDE: 98 mmol/L — AB (ref 101–111)
CO2: 24 mmol/L (ref 22–32)
Calcium: 8.9 mg/dL (ref 8.9–10.3)
Creatinine, Ser: 0.82 mg/dL (ref 0.44–1.00)
GFR calc non Af Amer: >60 mL/min (ref >60)
Glucose, Bld: 106 mg/dL — ABNORMAL HIGH (ref 65–99)
POTASSIUM: 3.5 mmol/L (ref 3.5–5.1)
Sodium: 130 mmol/L — ABNORMAL LOW (ref 135–145)
TOTAL PROTEIN: 6.1 g/dL — AB (ref 6.5–8.1)

## 2016-09-12 MED ORDER — DIPHENHYDRAMINE HCL 50 MG/ML IJ SOLN
50.0000 mg | Freq: Once | INTRAMUSCULAR | Status: AC
  Administered 2016-09-12: 50 mg via INTRAVENOUS
  Filled 2016-09-12: qty 1

## 2016-09-12 MED ORDER — SODIUM CHLORIDE 0.9 % IV SOLN
20.0000 mg | Freq: Once | INTRAVENOUS | Status: AC
  Administered 2016-09-12: 20 mg via INTRAVENOUS
  Filled 2016-09-12: qty 2

## 2016-09-12 MED ORDER — HEPARIN SOD (PORK) LOCK FLUSH 100 UNIT/ML IV SOLN
500.0000 [IU] | Freq: Once | INTRAVENOUS | Status: AC | PRN
  Administered 2016-09-12: 500 [IU]
  Filled 2016-09-12: qty 5

## 2016-09-12 MED ORDER — SODIUM CHLORIDE 0.9 % IV SOLN
Freq: Once | INTRAVENOUS | Status: AC
  Administered 2016-09-12: 13:00:00 via INTRAVENOUS

## 2016-09-12 MED ORDER — FAMOTIDINE IN NACL 20-0.9 MG/50ML-% IV SOLN
20.0000 mg | Freq: Once | INTRAVENOUS | Status: AC
  Administered 2016-09-12: 20 mg via INTRAVENOUS
  Filled 2016-09-12: qty 50

## 2016-09-12 MED ORDER — PACLITAXEL CHEMO INJECTION 300 MG/50ML
80.0000 mg/m2 | Freq: Once | INTRAVENOUS | Status: AC
  Administered 2016-09-12: 144 mg via INTRAVENOUS
  Filled 2016-09-12: qty 24

## 2016-09-12 NOTE — Progress Notes (Signed)
Tolerated infusion w/o adverse reaction.  Alert, in no distress.  VSS.  Discharged ambulatory in c/o spouse.  

## 2016-09-12 NOTE — Patient Instructions (Signed)
[facility] Discharge Instructions for Patients Receiving Chemotherapy   Beginning January 23rd 2017 lab work for the Cancer Center will be done in the  Main lab at [place] on 1st floor. If you have a lab appointment with the Cancer Center please come in thru the  Main Entrance and check in at the main information desk   Today you received the following chemotherapy agents:  Taxol  If you develop nausea and vomiting, or diarrhea that is not controlled by your medication, call the clinic.  The clinic phone number is [phone]. Office hours are Monday-Friday 8:30am-5:00pm.  BELOW ARE SYMPTOMS THAT SHOULD BE REPORTED IMMEDIATELY:  *FEVER GREATER THAN 101.0 F  *CHILLS WITH OR WITHOUT FEVER  NAUSEA AND VOMITING THAT IS NOT CONTROLLED WITH YOUR NAUSEA MEDICATION  *UNUSUAL SHORTNESS OF BREATH  *UNUSUAL BRUISING OR BLEEDING  TENDERNESS IN MOUTH AND THROAT WITH OR WITHOUT PRESENCE OF ULCERS  *URINARY PROBLEMS  *BOWEL PROBLEMS  UNUSUAL RASH Items with * indicate a potential emergency and should be followed up as soon as possible. If you have an emergency after office hours please contact your primary care physician or go to the nearest emergency department.  Please call the clinic during office hours if you have any questions or concerns.   You may also contact the Patient Navigator at [phone] should you have any questions or need assistance in obtaining follow up care.      Resources For Cancer Patients and their Caregivers ? American Cancer Society: Can assist with transportation, wigs, general needs, runs Look Good Feel Better.        1-888-227-6333 ? Cancer Care: Provides financial assistance, online support groups, medication/co-pay assistance.  1-800-813-HOPE (4673) ? [facility] Assists [place] cancer patients and their families through emotional , educational and financial support.  336-427-4357 ? [place] DSS Where to apply for food stamps, Medicaid and utility assistance. 336-342-1394 ? RC[facility]: Transportation to medical appointments. 336-347-2287 ? Social Security Administration: May apply for disability if have a Stage IV cancer. 336-342-7796 1-800-772-1213 ? [place] Aging, Disability and Transit Services: Assists with nutrition, care and transit needs. [phone]         

## 2016-09-17 ENCOUNTER — Other Ambulatory Visit (HOSPITAL_COMMUNITY): Payer: Self-pay | Admitting: Adult Health

## 2016-09-17 DIAGNOSIS — F419 Anxiety disorder, unspecified: Secondary | ICD-10-CM

## 2016-09-17 DIAGNOSIS — C50911 Malignant neoplasm of unspecified site of right female breast: Secondary | ICD-10-CM

## 2016-09-19 ENCOUNTER — Encounter (HOSPITAL_BASED_OUTPATIENT_CLINIC_OR_DEPARTMENT_OTHER): Payer: Medicare HMO

## 2016-09-19 ENCOUNTER — Encounter (HOSPITAL_COMMUNITY): Payer: Self-pay

## 2016-09-19 ENCOUNTER — Encounter (HOSPITAL_BASED_OUTPATIENT_CLINIC_OR_DEPARTMENT_OTHER): Payer: Medicare HMO | Admitting: Oncology

## 2016-09-19 VITALS — BP 127/69 | HR 96 | Temp 98.4°F | Resp 16 | Wt 170.0 lb

## 2016-09-19 VITALS — BP 145/73 | HR 87 | Temp 97.7°F | Resp 18

## 2016-09-19 DIAGNOSIS — F329 Major depressive disorder, single episode, unspecified: Secondary | ICD-10-CM | POA: Diagnosis not present

## 2016-09-19 DIAGNOSIS — F17210 Nicotine dependence, cigarettes, uncomplicated: Secondary | ICD-10-CM | POA: Diagnosis not present

## 2016-09-19 DIAGNOSIS — C773 Secondary and unspecified malignant neoplasm of axilla and upper limb lymph nodes: Secondary | ICD-10-CM | POA: Diagnosis not present

## 2016-09-19 DIAGNOSIS — C50911 Malignant neoplasm of unspecified site of right female breast: Secondary | ICD-10-CM | POA: Diagnosis not present

## 2016-09-19 DIAGNOSIS — Z720 Tobacco use: Secondary | ICD-10-CM | POA: Diagnosis not present

## 2016-09-19 DIAGNOSIS — Z5111 Encounter for antineoplastic chemotherapy: Secondary | ICD-10-CM

## 2016-09-19 DIAGNOSIS — Z170 Estrogen receptor positive status [ER+]: Secondary | ICD-10-CM | POA: Diagnosis not present

## 2016-09-19 DIAGNOSIS — F419 Anxiety disorder, unspecified: Secondary | ICD-10-CM

## 2016-09-19 DIAGNOSIS — J449 Chronic obstructive pulmonary disease, unspecified: Secondary | ICD-10-CM | POA: Diagnosis not present

## 2016-09-19 DIAGNOSIS — C50411 Malignant neoplasm of upper-outer quadrant of right female breast: Secondary | ICD-10-CM

## 2016-09-19 DIAGNOSIS — K760 Fatty (change of) liver, not elsewhere classified: Secondary | ICD-10-CM | POA: Diagnosis not present

## 2016-09-19 DIAGNOSIS — E785 Hyperlipidemia, unspecified: Secondary | ICD-10-CM | POA: Diagnosis not present

## 2016-09-19 DIAGNOSIS — I10 Essential (primary) hypertension: Secondary | ICD-10-CM | POA: Diagnosis not present

## 2016-09-19 DIAGNOSIS — M1990 Unspecified osteoarthritis, unspecified site: Secondary | ICD-10-CM | POA: Diagnosis not present

## 2016-09-19 LAB — COMPREHENSIVE METABOLIC PANEL
ALBUMIN: 4.1 g/dL (ref 3.5–5.0)
ALT: 33 U/L (ref 14–54)
AST: 32 U/L (ref 15–41)
Alkaline Phosphatase: 69 U/L (ref 38–126)
Anion gap: 7 (ref 5–15)
BILIRUBIN TOTAL: 0.7 mg/dL (ref 0.3–1.2)
BUN: 11 mg/dL (ref 6–20)
CO2: 25 mmol/L (ref 22–32)
Calcium: 9.1 mg/dL (ref 8.9–10.3)
Chloride: 99 mmol/L — ABNORMAL LOW (ref 101–111)
Creatinine, Ser: 0.83 mg/dL (ref 0.44–1.00)
GFR calc Af Amer: >60 mL/min (ref >60)
GFR calc non Af Amer: >60 mL/min (ref >60)
GLUCOSE: 125 mg/dL — AB (ref 65–99)
POTASSIUM: 3.8 mmol/L (ref 3.5–5.1)
Sodium: 131 mmol/L — ABNORMAL LOW (ref 135–145)
TOTAL PROTEIN: 6.5 g/dL (ref 6.5–8.1)

## 2016-09-19 LAB — CBC WITH DIFFERENTIAL/PLATELET
BASOS ABS: 0 10*3/uL (ref 0.0–0.1)
BASOS PCT: 1 %
Eosinophils Absolute: 0.1 10*3/uL (ref 0.0–0.7)
Eosinophils Relative: 3 %
HEMATOCRIT: 33.3 % — AB (ref 36.0–46.0)
Hemoglobin: 11.7 g/dL — ABNORMAL LOW (ref 12.0–15.0)
Lymphocytes Relative: 20 %
Lymphs Abs: 1.1 10*3/uL (ref 0.7–4.0)
MCH: 33.3 pg (ref 26.0–34.0)
MCHC: 35.1 g/dL (ref 30.0–36.0)
MCV: 94.9 fL (ref 78.0–100.0)
MONO ABS: 0.4 10*3/uL (ref 0.1–1.0)
Monocytes Relative: 8 %
NEUTROS ABS: 3.6 10*3/uL (ref 1.7–7.7)
NEUTROS PCT: 68 %
Platelets: 221 10*3/uL (ref 150–400)
RBC: 3.51 MIL/uL — ABNORMAL LOW (ref 3.87–5.11)
RDW: 19.2 % — AB (ref 11.5–15.5)
WBC: 5.3 10*3/uL (ref 4.0–10.5)

## 2016-09-19 MED ORDER — SODIUM CHLORIDE 0.9 % IV SOLN
Freq: Once | INTRAVENOUS | Status: AC
  Administered 2016-09-19: 13:00:00 via INTRAVENOUS

## 2016-09-19 MED ORDER — HEPARIN SOD (PORK) LOCK FLUSH 100 UNIT/ML IV SOLN
500.0000 [IU] | Freq: Once | INTRAVENOUS | Status: AC | PRN
Start: 2016-09-19 — End: 2016-09-19
  Administered 2016-09-19: 500 [IU]
  Filled 2016-09-19: qty 5

## 2016-09-19 MED ORDER — DEXAMETHASONE SODIUM PHOSPHATE 100 MG/10ML IJ SOLN
20.0000 mg | Freq: Once | INTRAMUSCULAR | Status: AC
  Administered 2016-09-19: 20 mg via INTRAVENOUS
  Filled 2016-09-19: qty 2

## 2016-09-19 MED ORDER — DIPHENHYDRAMINE HCL 50 MG/ML IJ SOLN
50.0000 mg | Freq: Once | INTRAMUSCULAR | Status: AC
  Administered 2016-09-19: 50 mg via INTRAVENOUS

## 2016-09-19 MED ORDER — PACLITAXEL CHEMO INJECTION 300 MG/50ML
80.0000 mg/m2 | Freq: Once | INTRAVENOUS | Status: AC
  Administered 2016-09-19: 144 mg via INTRAVENOUS
  Filled 2016-09-19: qty 24

## 2016-09-19 MED ORDER — FAMOTIDINE IN NACL 20-0.9 MG/50ML-% IV SOLN
INTRAVENOUS | Status: AC
  Filled 2016-09-19: qty 50

## 2016-09-19 MED ORDER — FAMOTIDINE IN NACL 20-0.9 MG/50ML-% IV SOLN
20.0000 mg | Freq: Once | INTRAVENOUS | Status: AC
  Administered 2016-09-19: 20 mg via INTRAVENOUS

## 2016-09-19 MED ORDER — DIPHENHYDRAMINE HCL 50 MG/ML IJ SOLN
INTRAMUSCULAR | Status: AC
  Filled 2016-09-19: qty 1

## 2016-09-19 MED ORDER — LORAZEPAM 0.5 MG PO TABS: 0.5000 mg | ORAL_TABLET | Freq: Three times a day (TID) | ORAL | 0 refills | Status: DC

## 2016-09-19 NOTE — Progress Notes (Signed)
[facility] Hematology/Oncology Progress Note  Name: Ashley Doyle      MRN: [id number]    Date: 09/19/2016 Time:11:58 AM   REFERRING PHYSICIAN:  Wende Neighbors, MD (Primary Care Provider)  REASON FOR CONSULT:  Grade 2 invasive ductal carcinoma   DIAGNOSIS:  Invasive ductal carcinoma of right breast ER/PR +, HER2 NEGATIVE, with right axillary lymph node positive for disease.    Invasive ductal carcinoma of breast, female, right ([place])   02/21/2016 Mammogram    Irregular vascular mass in the right breast at 10 o'clock, 10 cm the nipple, measuring 2.9 x 2.5 x 2.6 cm. In the right axilla there is 3 discrete enlarged abnormal appearing lymph nodes. Largest, which lies lateral to the palpable mass, measures 1.9 x 1.3 x 1.9 cm.      02/28/2016 Procedure    Right needle core biopsy in 10:00 position and right axillary biopsy      02/28/2016 Procedure    Postprocedure mammogram for clip placement. Ribbon shaped clip well-positioned within the biopsied mass in the right breast at the 10 o'clock axis. Wing shaped clip is well-positioned within the biopsied lymph node as seen on postprocedure ultrasound evaluation.  Final Assessment: Post Procedure Mammograms for Marker Placement      03/01/2016 Pathology Results    Invasive ductal carcinoma with extracellular mucin of right breast mass and ductal carcinoma of right axillary lymph node.  Grade 2.  ER 100%, PR 100%, Ki-67 12%, HER2 NEGATIVE.      03/12/2016 Imaging    CT CAP- Right breast cancer with associated right axillary metastatic lymph nodes. 2. No evidence of distant metastatic disease. 3. Hepatic steatosis.      03/26/2016 Breast MRI    2.7 cm spiculated mass in the upper-outer quadrant of right breast corresponding with the invasive ductal carcinoma. Masses in the right axilla consistent with axillary adenopathy.      04/16/2016 -  Chemotherapy    The patient had DOXOrubicin (ADRIAMYCIN) chemo  injection 116 mg, 60 mg/m2 = 116 mg, Intravenous,  Once, 4 of 4 cycles Dose modification: 50 mg/m2 (original dose 60 mg/m2, Cycle 4, Reason: Dose not tolerated)  palonosetron (ALOXI) injection 0.25 mg, 0.25 mg, Intravenous,  Once, 4 of 4 cycles  pegfilgrastim (NEULASTA ONPRO KIT) injection 6 mg, 6 mg, Subcutaneous, Once, 4 of 4 cycles  cyclophosphamide (CYTOXAN) 1,160 mg in sodium chloride 0.9 % 250 mL chemo infusion, 600 mg/m2 = 1,160 mg, Intravenous,  Once, 4 of 4 cycles Dose modification: 500 mg/m2 (original dose 600 mg/m2, Cycle 4, Reason: Dose not tolerated)  PACLitaxel (TAXOL) 156 mg in dextrose 5 % 250 mL chemo infusion ( fosaprepitant (EMEND) 150 mg, dexamethasone (DECADRON) 12 mg in sodium chloride 0.9 % 145 mL IVPB, , Intravenous,  Once, 4 of 4 cycles  for chemotherapy treatment.        05/28/2016 Adverse Reaction    Thrombocytopenia      05/28/2016 Treatment Plan Change    AC #4 delayed x 1 week      06/13/2016 Treatment Plan Change    Adriamycin d/c'd for cycle #4 due to concerns for cardiomyopathy/fluid volume overload.       06/14/2016 Echocardiogram    EF 60-65%      HISTORY OF PRESENT ILLNESS:   Ashley Doyle is a 71 y.o. female with a medical history significant for tobacco abuse, hyperlipidemia, major depressive disorder, scistica, dysthymic disorder, esstenial HTN,  who is here for  a follow-up invasive ductal carcinoma of right breast ER/PR +, HER2 NEGATIVE, with right axillary lymph node positive for disease.   Patient presents today for follow up. She will be getting cycle 11 of taxol today. She is doing okay today, tolerating treatment well. She has a lot of chronic joint/bone pains which have been presents even prior to her breast cancer diagnosis. She has arthritis, sciatica, and pinched nerves. Her PCP was giving her pain medication, but he hasn't given her any since she started getting chemo. Her fingers and toes "throb", but she can still button her clothes  and feel the ground. She also had some burning in her feet, which has since resolved. She denies chest pain, and reports some shortness of breath. She denies lower extremity edema.  She wants a bilateral mastectomy rather than a unilateral mastectomy in the affected breastbecause she doesn't believe she will be able to get to radiation 5 days a week, and she "does not want to go through this again." She requests a refill of Ativan. Denies any other concerns. She still smokes 2 ppd.   Review of Systems  Constitutional: Positive for malaise/fatigue. Negative for weight loss.  HENT: Positive for hearing loss.   Eyes: Negative.   Respiratory: Positive for shortness of breath.   Cardiovascular: Negative.  Negative for chest pain and leg swelling.  Gastrointestinal: Negative.   Genitourinary: Negative.   Musculoskeletal: Positive for joint pain (arthritis).  Skin: Negative.   Neurological: Positive for tingling (hands and feet, resolving) and weakness.       Burning in feet, resolving  Endo/Heme/Allergies: Negative.   Psychiatric/Behavioral: Negative.   All other systems reviewed and are negative. 14 point review of systems was performed and is negative except as detailed under history of present illness and above  PAST MEDICAL HISTORY:   Past Medical History:  Diagnosis Date  . Anxiety   . Arthritis   . Asthma   . Breast cancer ([place])   . COPD (chronic obstructive pulmonary disease) ([place])   . Depression   . Emphysema of lung ([place])   . Family history of adverse reaction to anesthesia    sister has PONV  . GERD (gastroesophageal reflux disease)   . HOH (hard of hearing)   . Hypertension   . Invasive ductal carcinoma of breast, female, right ([place]) 03/07/2016  . Pinched nerve in neck    and back  . Stroke ([place])   . Tuberculosis    "several years ago" 04/11/16  . Wears glasses   . Wears hearing aid    left ear    ALLERGIES: No Known Allergies    MEDICATIONS: I have reviewed the  patient's current medications.    Current Outpatient Prescriptions on File Prior to Visit  Medication Sig Dispense Refill  . citalopram (CELEXA) 40 MG tablet Take 40 mg by mouth daily.  4  . dexamethasone (DECADRON) 4 MG tablet Take 2 tablets by mouth once a day on the day after chemotherapy and then take 2 tablets two times a day for 2 days. Take with food. 30 tablet 1  . gabapentin (NEURONTIN) 300 MG capsule Take 1 capsule (300 mg total) by mouth 3 (three) times daily. 90 capsule 2  . KLOR-CON M20 20 MEQ tablet TAKE 2 TABLETS (40 MEQ TOTAL) BY MOUTH 2 (TWO) TIMES DAILY. 60 tablet 1  . lidocaine-prilocaine (EMLA) cream Apply to affected area once 30 g 3  . LORazepam (ATIVAN) 0.5 MG tablet Take 1 tablet (0.5 mg  total) by mouth every 8 (eight) hours. 30 tablet 0  . omeprazole (PRILOSEC) 20 MG capsule Take 1 capsule (20 mg total) by mouth daily. 30 capsule 5  . ondansetron (ZOFRAN) 8 MG tablet Take 1 tablet (8 mg total) by mouth every 8 (eight) hours as needed. Start on the third day after chemotherapy. 30 tablet 1  . oxyCODONE-acetaminophen (PERCOCET) 10-325 MG tablet Take 1 tablet by mouth every 4 (four) hours as needed for pain. 1 month supply 90 tablet 0  . prochlorperazine (COMPAZINE) 10 MG tablet Take 1 tablet (10 mg total) by mouth every 6 (six) hours as needed (Nausea or vomiting). 30 tablet 1  . promethazine (PHENERGAN) 25 MG tablet Take 0.5 tablets (12.5 mg total) by mouth every 6 (six) hours as needed for nausea or vomiting. 30 tablet 1  . triamterene-hydrochlorothiazide (MAXZIDE-25) 37.5-25 MG tablet Take 1 tablet by mouth daily.  1   No current facility-administered medications on file prior to visit.      PAST SURGICAL HISTORY Past Surgical History:  Procedure Laterality Date  . ABDOMINAL HYSTERECTOMY    . APPENDECTOMY    . INNER EAR SURGERY Right   . PORTACATH PLACEMENT N/A 04/12/2016   Procedure: INSERTION PORT-A-CATH WITH [place];  Surgeon: Rolm Bookbinder, MD;  Location: [facility];  Service: General;  Laterality: N/A;    FAMILY HISTORY: She reports a family history of cancer:  Bother deceased at the age of 9 secondary to colon cancer  Brother is survivor of bladder cancer  Sister is survivor with history of colon cancer requiring colectomy.  1 son is deceased at the age of 40 secondary to complications with cerebral palsy 1 son alive, living in [place].  He is a Curator. 1 Grandson 1 Great-granddaughter.  SOCIAL HISTORY:  reports that she has been smoking Cigarettes.  She has a 108.00 pack-year smoking history. She has never used smokeless tobacco. She reports that she does not drink alcohol or use drugs.   She is Psychologist, forensic in religion.  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  She is married x 24 years to current husband who is a cancer survivor himself of head and neck cancer treated by Dr. Isidore Moos (XRT) and Dr. Alvy Bimler (Medical Oncology); currently 1.5 year survivor.  Social History   Social History  . Marital status: Married    Spouse name: N/A  . Number of children: N/A  . Years of education: N/A   Social History Main Topics  . Smoking status: Current Every Day Smoker    Packs/day: 2.00    Years: 54.00    Types: Cigarettes  . Smokeless tobacco: Never Used  . Alcohol use No  . Drug use: No  . Sexual activity: Not on file     Comment: hysterectomy   Other Topics Concern  . Not on file   Social History Narrative  . No narrative on file   PERFORMANCE STATUS: The patient's performance status is 1 - Symptomatic but completely ambulatory  PHYSICAL EXAM: Most Recent Vital Signs:   Physical Exam  Constitutional: She is oriented to person, place, and time and well-developed, well-nourished, and in no distress.  alopecia  HENT:  Head: Normocephalic and atraumatic.  Mouth/Throat: Oropharynx is clear and moist.  Eyes: Conjunctivae and EOM are normal. Pupils are equal, round, and reactive to light.  Neck: Normal range of  motion. Neck supple.  Cardiovascular: Normal rate, regular rhythm and normal heart sounds.  Exam reveals no gallop and  no friction rub.   No murmur heard. Pulmonary/Chest: Effort normal. No respiratory distress. She has no wheezes. She exhibits no tenderness.  chemoport on right chest wall C/D/I. Coarse rhonchi bilaterally.  Abdominal: Soft. Bowel sounds are normal. She exhibits no distension and no mass. There is no tenderness. There is no rebound and no guarding.  Musculoskeletal: Normal range of motion. She exhibits no edema.  Neurological: She is alert and oriented to person, place, and time. Gait normal.  Skin: Skin is warm and dry.  Nursing note and vitals reviewed.  LABORATORY DATA:  I have reviewed the data as listed. CBC Latest Ref Rng & Units 09/12/2016 09/05/2016 08/29/2016  WBC 4.0 - 10.5 K/uL 5.3 6.6 6.3  Hemoglobin 12.0 - 15.0 g/dL 11.1(L) 11.7(L) 11.4(L)  Hematocrit 36.0 - 46.0 % 31.4(L) 33.1(L) 31.7(L)  Platelets 150 - 400 K/uL 197 227 230   CMP Latest Ref Rng & Units 09/12/2016 09/05/2016 08/29/2016  Glucose 65 - 99 mg/dL 106(H) 93 95  BUN 6 - 20 mg/dL '10 8 9  ' Creatinine 0.44 - 1.00 mg/dL 0.82 0.84 0.84  Sodium 135 - 145 mmol/L 130(L) 134(L) 132(L)  Potassium 3.5 - 5.1 mmol/L 3.5 3.7 4.1  Chloride 101 - 111 mmol/L 98(L) 100(L) 101  CO2 22 - 32 mmol/L '24 25 23  ' Calcium 8.9 - 10.3 mg/dL 8.9 9.2 8.9  Total Protein 6.5 - 8.1 g/dL 6.1(L) 6.1(L) 6.2(L)  Total Bilirubin 0.3 - 1.2 mg/dL 0.8 0.6 0.5  Alkaline Phos 38 - 126 U/L 59 71 75  AST 15 - 41 U/L '30 24 23  ' ALT 14 - 54 U/L '30 26 24   ' RADIOGRAPHY: I have reviewed the data as listed below and agree with the findings.  DG Chest 2 View 06/13/2016 IMPRESSION: No active cardiopulmonary disease.  PATHOLOGY:     ASSESSMENT/PLAN:  Invasive ductal carcinoma of breast, female, right upper outer quadrant ER/PR +, HER2 NEGATIVE  Right axillary lymph node positive for disease (R axillary mets) Tobacco Abuse Hepatic  Steatosis Port placement  PLAN: - Refill Ativan today. - Labs reviewed. Proceed with cycle 11 taxol chemo today. - post treatment MRI of breast scheduled for 10/03/16. - follow up with Dr. Donne Hazel for surgical evaluation and to discuss the option of bilateral mastectomy with Dr. Donne Hazel. - RTC in 1 month for follow up.  All questions were answered. The patient knows to call the clinic with any problems, questions or concerns. We can certainly see the patient much sooner if necessary.  This document serves as a record of services personally performed by Twana First, MD. It was created on her behalf by Maryla Morrow, a trained medical scribe. The creation of this record is based on the scribe's personal observations and the provider's statements to them. This document has been checked and approved by the attending provider.  I have reviewed the above documentation for accuracy and completeness and I agree with the above.  This note is electronically signed by:  Twana First, MD  09/19/2016 11:58 AM

## 2016-09-19 NOTE — Progress Notes (Signed)
Tolerated chemo well. Stable and ambulatory on discharge home with husband. 

## 2016-09-19 NOTE — Patient Instructions (Addendum)
[place] at [facility] Discharge Instructions  RECOMMENDATIONS MADE BY THE CONSULTANT AND ANY TEST RESULTS WILL BE SENT TO YOUR REFERRING PHYSICIAN.  You were seen today by Dr. Twana First Follow up in 2 weeks for next treatment Follow up in 4 weeks with lab work  Thank you for choosing [place] at [facility] to provide your oncology and hematology care.  To afford each patient quality time with our provider, please arrive at least 15 minutes before your scheduled appointment time.    If you have a lab appointment with the [facility] please come in thru the  Main Entrance and check in at the main information desk  You need to re-schedule your appointment should you arrive 10 or more minutes late.  We strive to give you quality time with our providers, and arriving late affects you and other patients whose appointments are after yours.  Also, if you no show three or more times for appointments you may be dismissed from the clinic at the providers discretion.     Again, thank you for choosing [facility].  Our hope is that these requests will decrease the amount of time that you wait before being seen by our physicians.       _____________________________________________________________  Should you have questions after your visit to [facility], please contact our office at [phone] between the hours of 8:30 a.m. and 4:30 p.m.  Voicemails left after 4:30 p.m. will not be returned until the following business day.  For prescription refill requests, have your pharmacy contact our office.       Resources For Cancer Patients and their Caregivers ? American Cancer Society: Can assist with transportation, wigs, general needs, runs Look Good Feel Better.        414-265-9221 ? Cancer Care: Provides financial assistance, online support groups, medication/co-pay assistance.  1-800-813-HOPE (859)743-9801) ? [facility] Assists [place] cancer patients and their families through emotional , educational and financial support.  252-026-0303 ? [place] DSS Where to apply for food stamps, Medicaid and utility assistance. 4503080711 ? RCATS: Transportation to medical appointments. 516-847-1482 ? Social Security Administration: May apply for disability if have a Stage IV cancer. (715)199-4973 (403) 724-0552 ? [employer], Disability and Transit Services: Assists with nutrition, care and transit needs. [place] Support Programs: @10RELATIVEDAYS @ > Cancer Support Group  2nd Tuesday of the month 1pm-2pm, Journey Room  > Creative Journey  3rd Tuesday of the month 1130am-1pm, Journey Room  > Look Good Feel Better  1st Wednesday of the month 10am-12 noon, Journey Room (Call [facility] to register [phone])

## 2016-09-19 NOTE — Patient Instructions (Signed)
[facility] Discharge Instructions for Patients Receiving Chemotherapy   Beginning January 23rd 2017 lab work for the [facility] will be done in the  Main lab at [facility] on 1st floor. If you have a lab appointment with the [facility] please come in thru the  Main Entrance and check in at the main information desk   Today you received the following chemotherapy agents taxol week 11 of 12.  If you develop nausea and vomiting, or diarrhea that is not controlled by your medication, call the clinic.  The clinic phone number is [phone]. Office hours are Monday-Friday 8:30am-5:00pm.  BELOW ARE SYMPTOMS THAT SHOULD BE REPORTED IMMEDIATELY:  *FEVER GREATER THAN 101.0 F  *CHILLS WITH OR WITHOUT FEVER  NAUSEA AND VOMITING THAT IS NOT CONTROLLED WITH YOUR NAUSEA MEDICATION  *UNUSUAL SHORTNESS OF BREATH  *UNUSUAL BRUISING OR BLEEDING  TENDERNESS IN MOUTH AND THROAT WITH OR WITHOUT PRESENCE OF ULCERS  *URINARY PROBLEMS  *BOWEL PROBLEMS  UNUSUAL RASH Items with * indicate a potential emergency and should be followed up as soon as possible. If you have an emergency after office hours please contact your primary care physician or go to the nearest emergency department.  Please call the clinic during office hours if you have any questions or concerns.   You may also contact the Patient Navigator at [phone] should you have any questions or need assistance in obtaining follow up care.      Resources For Cancer Patients and their Caregivers ? American Cancer Society: Can assist with transportation, wigs, general needs, runs Look Good Feel Better.        (626)733-2705 ? Cancer Care: Provides financial assistance, online support groups, medication/co-pay assistance.  1-800-813-HOPE 229-221-3426) ? [facility] Assists [place] cancer patients and their families through emotional , educational and financial support.   (864)041-2081 ? [place] DSS Where to apply for food stamps, Medicaid and utility assistance. 831-149-3511 ? RCATS: Transportation to medical appointments. 916-225-6214 ? Social Security Administration: May apply for disability if have a Stage IV cancer. 312-163-0685 [phone] [phone] ? [employer], Disability and Transit Services: Assists with nutrition, care and transit needs. 518-121-6564

## 2016-09-26 ENCOUNTER — Encounter (HOSPITAL_COMMUNITY): Payer: Medicare HMO | Attending: Oncology

## 2016-09-26 ENCOUNTER — Encounter (HOSPITAL_COMMUNITY): Payer: Self-pay

## 2016-09-26 VITALS — BP 133/62 | HR 86 | Temp 98.5°F | Resp 18 | Wt 170.8 lb

## 2016-09-26 DIAGNOSIS — C773 Secondary and unspecified malignant neoplasm of axilla and upper limb lymph nodes: Secondary | ICD-10-CM | POA: Diagnosis not present

## 2016-09-26 DIAGNOSIS — C50911 Malignant neoplasm of unspecified site of right female breast: Secondary | ICD-10-CM | POA: Diagnosis not present

## 2016-09-26 DIAGNOSIS — Z5111 Encounter for antineoplastic chemotherapy: Secondary | ICD-10-CM | POA: Diagnosis not present

## 2016-09-26 DIAGNOSIS — C50411 Malignant neoplasm of upper-outer quadrant of right female breast: Secondary | ICD-10-CM | POA: Diagnosis not present

## 2016-09-26 LAB — CBC WITH DIFFERENTIAL/PLATELET
BASOS ABS: 0 10*3/uL (ref 0.0–0.1)
Basophils Relative: 1 %
Eosinophils Absolute: 0.1 10*3/uL (ref 0.0–0.7)
Eosinophils Relative: 3 %
HEMATOCRIT: 31.7 % — AB (ref 36.0–46.0)
HEMOGLOBIN: 11.2 g/dL — AB (ref 12.0–15.0)
LYMPHS PCT: 22 %
Lymphs Abs: 1 10*3/uL (ref 0.7–4.0)
MCH: 33.8 pg (ref 26.0–34.0)
MCHC: 35.3 g/dL (ref 30.0–36.0)
MCV: 95.8 fL (ref 78.0–100.0)
MONO ABS: 0.6 10*3/uL (ref 0.1–1.0)
Monocytes Relative: 12 %
NEUTROS ABS: 3 10*3/uL (ref 1.7–7.7)
Neutrophils Relative %: 62 %
Platelets: 217 10*3/uL (ref 150–400)
RBC: 3.31 MIL/uL — AB (ref 3.87–5.11)
RDW: 19.4 % — AB (ref 11.5–15.5)
WBC: 4.8 10*3/uL (ref 4.0–10.5)

## 2016-09-26 LAB — COMPREHENSIVE METABOLIC PANEL
ALK PHOS: 65 U/L (ref 38–126)
ALT: 33 U/L (ref 14–54)
AST: 26 U/L (ref 15–41)
Albumin: 4.1 g/dL (ref 3.5–5.0)
Anion gap: 7 (ref 5–15)
BUN: 12 mg/dL (ref 6–20)
CALCIUM: 9.3 mg/dL (ref 8.9–10.3)
CO2: 25 mmol/L (ref 22–32)
CREATININE: 0.73 mg/dL (ref 0.44–1.00)
Chloride: 101 mmol/L (ref 101–111)
GFR calc Af Amer: >60 mL/min (ref >60)
Glucose, Bld: 101 mg/dL — ABNORMAL HIGH (ref 65–99)
Potassium: 3.7 mmol/L (ref 3.5–5.1)
Sodium: 133 mmol/L — ABNORMAL LOW (ref 135–145)
Total Bilirubin: 0.8 mg/dL (ref 0.3–1.2)
Total Protein: 6.5 g/dL (ref 6.5–8.1)

## 2016-09-26 MED ORDER — SODIUM CHLORIDE 0.9 % IV SOLN
20.0000 mg | Freq: Once | INTRAVENOUS | Status: AC
  Administered 2016-09-26: 20 mg via INTRAVENOUS
  Filled 2016-09-26: qty 2

## 2016-09-26 MED ORDER — DIPHENHYDRAMINE HCL 50 MG/ML IJ SOLN
50.0000 mg | Freq: Once | INTRAMUSCULAR | Status: AC
  Administered 2016-09-26: 50 mg via INTRAVENOUS
  Filled 2016-09-26: qty 1

## 2016-09-26 MED ORDER — HEPARIN SOD (PORK) LOCK FLUSH 100 UNIT/ML IV SOLN
500.0000 [IU] | Freq: Once | INTRAVENOUS | Status: AC | PRN
  Administered 2016-09-26: 500 [IU]
  Filled 2016-09-26: qty 5

## 2016-09-26 MED ORDER — PACLITAXEL CHEMO INJECTION 300 MG/50ML
80.0000 mg/m2 | Freq: Once | INTRAVENOUS | Status: AC
  Administered 2016-09-26: 144 mg via INTRAVENOUS
  Filled 2016-09-26: qty 24

## 2016-09-26 MED ORDER — SODIUM CHLORIDE 0.9 % IV SOLN
Freq: Once | INTRAVENOUS | Status: AC
  Administered 2016-09-26: 13:00:00 via INTRAVENOUS

## 2016-09-26 MED ORDER — FAMOTIDINE IN NACL 20-0.9 MG/50ML-% IV SOLN
20.0000 mg | Freq: Once | INTRAVENOUS | Status: AC
  Administered 2016-09-26: 20 mg via INTRAVENOUS
  Filled 2016-09-26: qty 50

## 2016-09-26 NOTE — Progress Notes (Signed)
Tolerated tx w/o adverse reaction.  Alert, in no distress.  VSS.  Discharged ambulatory in c/o spouse.  

## 2016-09-26 NOTE — Patient Instructions (Signed)
[facility] Discharge Instructions for Patients Receiving Chemotherapy   Beginning January 23rd 2017 lab work for the Cancer Center will be done in the  Main lab at [facility] on 1st floor. If you have a lab appointment with the Cancer Center please come in thru the  Main Entrance and check in at the main information desk   Today you received the following chemotherapy agents:  Taxol  If you develop nausea and vomiting, or diarrhea that is not controlled by your medication, call the clinic.  The clinic phone number is [phone]. Office hours are Monday-Friday 8:30am-5:00pm.  BELOW ARE SYMPTOMS THAT SHOULD BE REPORTED IMMEDIATELY:  *FEVER GREATER THAN 101.0 F  *CHILLS WITH OR WITHOUT FEVER  NAUSEA AND VOMITING THAT IS NOT CONTROLLED WITH YOUR NAUSEA MEDICATION  *UNUSUAL SHORTNESS OF BREATH  *UNUSUAL BRUISING OR BLEEDING  TENDERNESS IN MOUTH AND THROAT WITH OR WITHOUT PRESENCE OF ULCERS  *URINARY PROBLEMS  *BOWEL PROBLEMS  UNUSUAL RASH Items with * indicate a potential emergency and should be followed up as soon as possible. If you have an emergency after office hours please contact your primary care physician or go to the nearest emergency department.  Please call the clinic during office hours if you have any questions or concerns.   You may also contact the Patient Navigator at [phone] should you have any questions or need assistance in obtaining follow up care.      Resources For Cancer Patients and their Caregivers ? American Cancer Society: Can assist with transportation, wigs, general needs, runs Look Good Feel Better.        1-888-227-6333 ? Cancer Care: Provides financial assistance, online support groups, medication/co-pay assistance.  1-800-813-HOPE (4673) ? [facility] Assists [place] cancer patients and their families through emotional , educational and financial support.  336-427-4357 ? [place] DSS Where to apply for food stamps, Medicaid and utility assistance. 336-342-1394 ? RCATS: Transportation to medical appointments. 336-347-2287 ? Social Security Administration: May apply for disability if have a Stage IV cancer. 336-342-7796 1-800-772-1213 ? [place] Aging, Disability and Transit Services: Assists with nutrition, care and transit needs. 336-349-2343         

## 2016-10-03 ENCOUNTER — Ambulatory Visit (HOSPITAL_COMMUNITY): Payer: Medicare HMO

## 2016-10-04 DIAGNOSIS — H9193 Unspecified hearing loss, bilateral: Secondary | ICD-10-CM | POA: Diagnosis not present

## 2016-10-04 DIAGNOSIS — C50919 Malignant neoplasm of unspecified site of unspecified female breast: Secondary | ICD-10-CM | POA: Diagnosis not present

## 2016-10-04 DIAGNOSIS — F331 Major depressive disorder, recurrent, moderate: Secondary | ICD-10-CM | POA: Diagnosis not present

## 2016-10-04 DIAGNOSIS — I10 Essential (primary) hypertension: Secondary | ICD-10-CM | POA: Diagnosis not present

## 2016-10-04 DIAGNOSIS — G8929 Other chronic pain: Secondary | ICD-10-CM | POA: Diagnosis not present

## 2016-10-04 DIAGNOSIS — F419 Anxiety disorder, unspecified: Secondary | ICD-10-CM | POA: Diagnosis not present

## 2016-10-04 DIAGNOSIS — Z720 Tobacco use: Secondary | ICD-10-CM | POA: Diagnosis not present

## 2016-10-07 ENCOUNTER — Other Ambulatory Visit (HOSPITAL_COMMUNITY): Payer: Self-pay | Admitting: Oncology

## 2016-10-07 DIAGNOSIS — E876 Hypokalemia: Secondary | ICD-10-CM

## 2016-10-07 DIAGNOSIS — C50911 Malignant neoplasm of unspecified site of right female breast: Secondary | ICD-10-CM

## 2016-10-09 DIAGNOSIS — C50411 Malignant neoplasm of upper-outer quadrant of right female breast: Secondary | ICD-10-CM | POA: Diagnosis not present

## 2016-10-10 ENCOUNTER — Ambulatory Visit (HOSPITAL_COMMUNITY): Payer: Medicare HMO

## 2016-10-10 ENCOUNTER — Other Ambulatory Visit (HOSPITAL_COMMUNITY): Payer: Self-pay | Admitting: Oncology

## 2016-10-11 ENCOUNTER — Ambulatory Visit
Admission: RE | Admit: 2016-10-11 | Discharge: 2016-10-11 | Disposition: A | Discharge status: Home / Self Care | Payer: Medicare HMO | Source: Ambulatory Visit | Attending: Oncology | Admitting: Oncology

## 2016-10-11 DIAGNOSIS — C50911 Malignant neoplasm of unspecified site of right female breast: Secondary | ICD-10-CM

## 2016-10-11 DIAGNOSIS — R937 Abnormal findings on diagnostic imaging of other parts of musculoskeletal system: Secondary | ICD-10-CM | POA: Diagnosis not present

## 2016-10-11 MED ORDER — GADOBENATE DIMEGLUMINE 529 MG/ML IV SOLN
16.0000 mL | Freq: Once | INTRAVENOUS | Status: AC | PRN
  Administered 2016-10-11: 16 mL via INTRAVENOUS

## 2016-10-17 ENCOUNTER — Ambulatory Visit (HOSPITAL_COMMUNITY): Payer: Medicare HMO

## 2016-10-17 ENCOUNTER — Encounter (HOSPITAL_COMMUNITY): Payer: Medicare HMO | Attending: Oncology

## 2016-10-17 DIAGNOSIS — C50911 Malignant neoplasm of unspecified site of right female breast: Secondary | ICD-10-CM

## 2016-10-17 DIAGNOSIS — F419 Anxiety disorder, unspecified: Secondary | ICD-10-CM

## 2016-10-17 LAB — COMPREHENSIVE METABOLIC PANEL
ALT: 21 U/L (ref 14–54)
AST: 29 U/L (ref 15–41)
Albumin: 4 g/dL (ref 3.5–5.0)
Alkaline Phosphatase: 73 U/L (ref 38–126)
Anion gap: 8 (ref 5–15)
BUN: 11 mg/dL (ref 6–20)
CO2: 26 mmol/L (ref 22–32)
CREATININE: 0.84 mg/dL (ref 0.44–1.00)
Calcium: 9.2 mg/dL (ref 8.9–10.3)
Chloride: 100 mmol/L — ABNORMAL LOW (ref 101–111)
GFR calc Af Amer: >60 mL/min (ref >60)
Glucose, Bld: 122 mg/dL — ABNORMAL HIGH (ref 65–99)
POTASSIUM: 4.6 mmol/L (ref 3.5–5.1)
Sodium: 134 mmol/L — ABNORMAL LOW (ref 135–145)
Total Bilirubin: 0.9 mg/dL (ref 0.3–1.2)
Total Protein: 6.7 g/dL (ref 6.5–8.1)

## 2016-10-17 LAB — CBC WITH DIFFERENTIAL/PLATELET
Basophils Absolute: 0 10*3/uL (ref 0.0–0.1)
Basophils Relative: 1 %
EOS PCT: 2 %
Eosinophils Absolute: 0.1 10*3/uL (ref 0.0–0.7)
HCT: 34.6 % — ABNORMAL LOW (ref 36.0–46.0)
HEMOGLOBIN: 11.8 g/dL — AB (ref 12.0–15.0)
LYMPHS ABS: 1.7 10*3/uL (ref 0.7–4.0)
LYMPHS PCT: 26 %
MCH: 33.5 pg (ref 26.0–34.0)
MCHC: 34.1 g/dL (ref 30.0–36.0)
MCV: 98.3 fL (ref 78.0–100.0)
MONOS PCT: 11 %
Monocytes Absolute: 0.7 10*3/uL (ref 0.1–1.0)
NEUTROS PCT: 60 %
Neutro Abs: 3.9 10*3/uL (ref 1.7–7.7)
PLATELETS: 226 10*3/uL (ref 150–400)
RBC: 3.52 MIL/uL — AB (ref 3.87–5.11)
RDW: 17.1 % — ABNORMAL HIGH (ref 11.5–15.5)
WBC: 6.4 10*3/uL (ref 4.0–10.5)

## 2016-10-23 ENCOUNTER — Other Ambulatory Visit: Payer: Self-pay | Admitting: General Surgery

## 2016-10-23 DIAGNOSIS — C50411 Malignant neoplasm of upper-outer quadrant of right female breast: Secondary | ICD-10-CM

## 2016-10-23 DIAGNOSIS — Z170 Estrogen receptor positive status [ER+]: Principal | ICD-10-CM

## 2016-11-05 ENCOUNTER — Other Ambulatory Visit: Payer: Self-pay | Admitting: General Surgery

## 2016-11-05 DIAGNOSIS — Z170 Estrogen receptor positive status [ER+]: Principal | ICD-10-CM

## 2016-11-05 DIAGNOSIS — C50411 Malignant neoplasm of upper-outer quadrant of right female breast: Secondary | ICD-10-CM

## 2016-11-10 ENCOUNTER — Other Ambulatory Visit (HOSPITAL_COMMUNITY): Payer: Self-pay | Admitting: Oncology

## 2016-11-10 DIAGNOSIS — R1013 Epigastric pain: Secondary | ICD-10-CM

## 2016-11-15 ENCOUNTER — Inpatient Hospital Stay (HOSPITAL_COMMUNITY)
Admission: RE | Admit: 2016-11-15 | Discharge: 2016-11-15 | Disposition: A | Discharge status: Home / Self Care | Payer: Medicare HMO | Source: Ambulatory Visit

## 2016-11-15 NOTE — Pre-Procedure Instructions (Addendum)
Ashley Doyle  11/15/2016      [facility], [place] - [address] [place] [address] Phone: [phone] Fax: [phone]  [id number] - [place], [place] [address] [place] [address] Phone: [phone] Fax: [phone]    Your procedure is scheduled on July 12  Report to [facility] Admitting at 12:00 P..M.  Call this number if you have problems the morning of surgery:  [phone]   Remember:  Do not eat food or drink liquids after midnight.      DRINK BOOST BREEZE AT 10:00   Take these medicines the morning of surgery with A SIP OF WATER : citalopram(celexa), gabapentin(neurontin), lorazepam(ativan), omeprazole(prilosec), oxycodone if needed,            1 week prior to surgery stop: aspirin, advil, motrin, aleve, ibuprofen, BC Powders, Goody's, herbal medicines and vitamins.   Do not wear jewelry, make-up or nail polish.  Do not wear lotions, powders, or perfumes, or deoderant.  Do not shave 48 hours prior to surgery.  Men may shave face and neck.  Do not bring valuables to the hospital.  [facility] is not responsible for any belongings or valuables.  Contacts, dentures or bridgework may not be worn into surgery.  Leave your suitcase in the car.  After surgery it may be brought to your room.  For patients admitted to the hospital, discharge time will be determined by your treatment team.  Patients discharged the day of surgery will not be allowed to drive home.    Special instructions:   [facility]- Preparing For Surgery  Before surgery, you can play an important role. Because skin is not sterile, your skin needs to be as free of germs as possible. You can reduce the number of germs on your skin by washing with CHG (chlorahexidine gluconate) Soap before surgery.  CHG is an antiseptic cleaner which kills germs and bonds with the skin to continue killing  germs even after washing.  Please do not use if you have an allergy to CHG or antibacterial soaps. If your skin becomes reddened/irritated stop using the CHG.  Do not shave (including legs and underarms) for at least 48 hours prior to first CHG shower. It is OK to shave your face.  Please follow these instructions carefully.   1. Shower the NIGHT BEFORE SURGERY and the MORNING OF SURGERY with CHG.   2. If you chose to wash your hair, wash your hair first as usual with your normal shampoo.  3. After you shampoo, rinse your hair and body thoroughly to remove the shampoo.  4. Use CHG as you would any other liquid soap. You can apply CHG directly to the skin and wash gently with a scrungie or a clean washcloth.   5. Apply the CHG Soap to your body ONLY FROM THE NECK DOWN.  Do not use on open wounds or open sores. Avoid contact with your eyes, ears, mouth and genitals (private parts). Wash genitals (private parts) with your normal soap.  6. Wash thoroughly, paying special attention to the area where your surgery will be performed.  7. Thoroughly rinse your body with warm water from the neck down.  8. DO NOT shower/wash with your normal soap after using and rinsing off the CHG Soap.  9. Pat yourself dry with a CLEAN TOWEL.   10. Wear CLEAN PAJAMAS   11.  Place CLEAN SHEETS on your bed the night of your first shower and DO NOT SLEEP WITH PETS.    Day of Surgery: Do not apply any deodorants/lotions. Please wear clean clothes to the hospital/surgery center.      Please read over the following fact sheets that you were given. Coughing and Deep Breathing and Surgical Site Infection Prevention

## 2016-11-19 ENCOUNTER — Encounter (HOSPITAL_COMMUNITY): Payer: Self-pay

## 2016-11-19 ENCOUNTER — Other Ambulatory Visit: Payer: Self-pay | Admitting: General Surgery

## 2016-11-19 ENCOUNTER — Ambulatory Visit
Admission: RE | Admit: 2016-11-19 | Discharge: 2016-11-19 | Disposition: A | Discharge status: Home / Self Care | Payer: Medicare HMO | Source: Ambulatory Visit | Attending: General Surgery | Admitting: General Surgery

## 2016-11-19 ENCOUNTER — Encounter (HOSPITAL_COMMUNITY)
Admission: RE | Admit: 2016-11-19 | Discharge: 2016-11-19 | Disposition: A | Discharge status: Home / Self Care | Payer: Medicare HMO | Source: Ambulatory Visit | Attending: General Surgery | Admitting: General Surgery

## 2016-11-19 DIAGNOSIS — I10 Essential (primary) hypertension: Secondary | ICD-10-CM | POA: Insufficient documentation

## 2016-11-19 DIAGNOSIS — J449 Chronic obstructive pulmonary disease, unspecified: Secondary | ICD-10-CM | POA: Diagnosis not present

## 2016-11-19 DIAGNOSIS — Z170 Estrogen receptor positive status [ER+]: Secondary | ICD-10-CM

## 2016-11-19 DIAGNOSIS — K219 Gastro-esophageal reflux disease without esophagitis: Secondary | ICD-10-CM | POA: Insufficient documentation

## 2016-11-19 DIAGNOSIS — I739 Peripheral vascular disease, unspecified: Secondary | ICD-10-CM | POA: Diagnosis not present

## 2016-11-19 DIAGNOSIS — Z853 Personal history of malignant neoplasm of breast: Secondary | ICD-10-CM | POA: Diagnosis not present

## 2016-11-19 DIAGNOSIS — Z8673 Personal history of transient ischemic attack (TIA), and cerebral infarction without residual deficits: Secondary | ICD-10-CM | POA: Insufficient documentation

## 2016-11-19 DIAGNOSIS — J45909 Unspecified asthma, uncomplicated: Secondary | ICD-10-CM | POA: Diagnosis not present

## 2016-11-19 DIAGNOSIS — R2231 Localized swelling, mass and lump, right upper limb: Secondary | ICD-10-CM

## 2016-11-19 DIAGNOSIS — C50411 Malignant neoplasm of upper-outer quadrant of right female breast: Secondary | ICD-10-CM

## 2016-11-19 DIAGNOSIS — Z01818 Encounter for other preprocedural examination: Secondary | ICD-10-CM | POA: Diagnosis not present

## 2016-11-19 DIAGNOSIS — N6311 Unspecified lump in the right breast, upper outer quadrant: Secondary | ICD-10-CM | POA: Diagnosis not present

## 2016-11-19 HISTORY — DX: Sciatica, unspecified side: M54.30

## 2016-11-19 HISTORY — DX: Headache, unspecified: R51.9

## 2016-11-19 HISTORY — DX: Dyspnea, unspecified: R06.00

## 2016-11-19 HISTORY — DX: Peripheral vascular disease, unspecified: I73.9

## 2016-11-19 HISTORY — DX: Headache: R51

## 2016-11-19 HISTORY — DX: Constipation, unspecified: K59.00

## 2016-11-19 HISTORY — DX: Chronic kidney disease, unspecified: N18.9

## 2016-11-19 LAB — CBC
HCT: 40.3 % (ref 36.0–46.0)
Hemoglobin: 13.4 g/dL (ref 12.0–15.0)
MCH: 31.3 pg (ref 26.0–34.0)
MCHC: 33.3 g/dL (ref 30.0–36.0)
MCV: 94.2 fL (ref 78.0–100.0)
PLATELETS: 231 10*3/uL (ref 150–400)
RBC: 4.28 MIL/uL (ref 3.87–5.11)
RDW: 14 % (ref 11.5–15.5)
WBC: 7.9 10*3/uL (ref 4.0–10.5)

## 2016-11-19 LAB — BASIC METABOLIC PANEL
Anion gap: 8 (ref 5–15)
BUN: 9 mg/dL (ref 6–20)
CALCIUM: 9.5 mg/dL (ref 8.9–10.3)
CO2: 25 mmol/L (ref 22–32)
Chloride: 106 mmol/L (ref 101–111)
Creatinine, Ser: 0.87 mg/dL (ref 0.44–1.00)
GFR calc Af Amer: >60 mL/min (ref >60)
GLUCOSE: 94 mg/dL (ref 65–99)
Potassium: 4.3 mmol/L (ref 3.5–5.1)
Sodium: 139 mmol/L (ref 135–145)

## 2016-11-19 NOTE — Pre-Procedure Instructions (Signed)
Ashley Doyle  11/19/2016     Your procedure is scheduled on Thursday, July 12  Report to [facility] Admitting at 12:00 noon               Your surgery or procedure is scheduled for 2:00 PM   Call this number if you have problems the morning of surgery: [phone]- Pre- Op Desk                 For any other questions, please call [phone], Monday - Friday 8 AM - 4 PM.   Remember:  Do not eat food or drink liquids after midnight Wednesday, July 11.   Except drink carton of Breeze before 10:00 AM   Take these medicines the morning of surgery with A SIP OF WATER: citalopram (CELEXA), gabapentin (NEURONTIN),omeprazole (PRILOSEC). May take if needed-  oxyCODONE-acetaminophen (PERCOCET), nausea medication.  Special instructions:  [facility]- Preparing For Surgery  Before surgery, you can play an important role. Because skin is not sterile, your skin needs to be as free of germs as possible. You can reduce the number of germs on your skin by washing with CHG (chlorahexidine gluconate) Soap before surgery.  CHG is an antiseptic cleaner which kills germs and bonds with the skin to continue killing germs even after washing.  Please do not use if you have an allergy to CHG or antibacterial soaps. If your skin becomes reddened/irritated stop using the CHG.  Do not shave (including legs and underarms) for at least 48 hours prior to first CHG shower. It is OK to shave your face.  Please follow these instructions carefully.   1. Shower the NIGHT BEFORE SURGERY and the MORNING OF SURGERY with CHG.   2. If you chose to wash your hair, wash your hair first as usual with your normal shampoo.  3. After you shampoo, rinse your hair and body thoroughly to remove the shampoo.  4. Use CHG as you would any other liquid soap. You can apply CHG directly to the skin and wash gently with a scrungie or a clean washcloth.   5. Apply the CHG Soap to your body ONLY FROM THE NECK DOWN.  Do  not use on open wounds or open sores. Avoid contact with your eyes, ears, mouth and genitals (private parts). Wash genitals (private parts) with your normal soap.  6. Wash thoroughly, paying special attention to the area where your surgery will be performed.  7. Thoroughly rinse your body with warm water from the neck down.  8. DO NOT shower/wash with your normal soap after using and rinsing off the CHG Soap.  9. Pat yourself dry with a CLEAN TOWEL.   10. Wear CLEAN PAJAMAS   11. Place CLEAN SHEETS on your bed the night of your first shower and DO NOT SLEEP WITH PETS.  Day of Surgery: Do not apply any deodorants/lotionspowders, or perfumes, . Please wear clean clothes to the hospital/surgery center.    Do not wear jewelry, make-up or nail polish.  Do not shave 48 hours prior to surgery.  Men may shave face and neck.  Do not bring valuables to the hospital.  [facility] is not responsible for any belongings or valuables.  Contacts, dentures or bridgework may not be worn into surgery.  Leave your suitcase in the car.  After surgery it may be brought to your room.  For patients admitted to the hospital, discharge time will be determined by your treatment team.  Patients discharged the day of surgery will not be allowed to drive home.   Please read over the following fact sheets that you were given: Coughing and Deep Breathing, Pain Booklet, Surgical Site Infections

## 2016-11-19 NOTE — Progress Notes (Signed)
Ashley Doyle denies chest pain or shortness of breath at rest. Patient reports that sh has been told that she has an irregular heart beat at times and was started on potassium.

## 2016-11-19 NOTE — Pre-Procedure Instructions (Addendum)
Ashley Doyle  11/19/2016     Your procedure is scheduled on Thursday, July 12  Report to [facility] Admitting at 12:00 noon               Your surgery or procedure is scheduled for 2:00 PM   Call this number if you have problems the morning of surgery: [phone]- Pre- Op Desk                 For any other questions, please call [phone], Monday - Friday 8 AM - 4 PM.   Remember:  Do not eat food or drink liquids after midnight Wednesday, July 11.   Except drink carton of Breeze before 10:00 AM   Take these medicines the morning of surgery with A SIP OF WATER:) Bupropion (Wellbutrin),  gabapentin (NEURONTIN),omeprazole (PRILOSEC). May take if needed-  oxyCODONE-acetaminophen (PERCOCET), Lorazepam  Special instructions:  [facility]- Preparing For Surgery  Before surgery, you can play an important role. Because skin is not sterile, your skin needs to be as free of germs as possible. You can reduce the number of germs on your skin by washing with CHG (chlorahexidine gluconate) Soap before surgery.  CHG is an antiseptic cleaner which kills germs and bonds with the skin to continue killing germs even after washing.  Please do not use if you have an allergy to CHG or antibacterial soaps. If your skin becomes reddened/irritated stop using the CHG.  Do not shave (including legs and underarms) for at least 48 hours prior to first CHG shower. It is OK to shave your face.  Please follow these instructions carefully.   1. Shower the NIGHT BEFORE SURGERY and the MORNING OF SURGERY with CHG.   2. If you chose to wash your hair, wash your hair first as usual with your normal shampoo.  3. After you shampoo, rinse your hair and body thoroughly to remove the shampoo.  4. Use CHG as you would any other liquid soap. You can apply CHG directly to the skin and wash gently with a scrungie or a clean washcloth.   5. Apply the CHG Soap to your body ONLY FROM THE NECK DOWN.  Do not  use on open wounds or open sores. Avoid contact with your eyes, ears, mouth and genitals (private parts). Wash genitals (private parts) with your normal soap.  6. Wash thoroughly, paying special attention to the area where your surgery will be performed.  7. Thoroughly rinse your body with warm water from the neck down.  8. DO NOT shower/wash with your normal soap after using and rinsing off the CHG Soap.  9. Pat yourself dry with a CLEAN TOWEL.   10. Wear CLEAN PAJAMAS   11. Place CLEAN SHEETS on your bed the night of your first shower and DO NOT SLEEP WITH PETS.  Day of Surgery: Do not apply any deodorants/lotionspowders, or perfumes, . Please wear clean clothes to the hospital/surgery center.    Do not wear jewelry, make-up or nail polish.  Do not shave 48 hours prior to surgery.  Men may shave face and neck.  Do not bring valuables to the hospital.  [facility] is not responsible for any belongings or valuables.  Contacts, dentures or bridgework may not be worn into surgery.  Leave your suitcase in the car.  After surgery it may be brought to your room.  For patients admitted to the hospital, discharge time will be determined by your treatment team.  Patients discharged the day of surgery will not be allowed to drive home.   Please read over the following fact sheets that you were given: Coughing and Deep Breathing, Pain Booklet, Surgical Site Infections

## 2016-11-20 NOTE — Progress Notes (Signed)
Anesthesia Chart Review:    Patient is a 71 year old female scheduled for R total mastectomy with radioactive seed targeted R axillary lymph node excision and axillary's sentinel lymph node biopsy, possible right axillary dissection on 11/22/2016 with Rolm Bookbinder, M.D.  - PCP is Delphina Cahill, M.D. - Oncologist is Twana First, MD  PMH includes: HTN, stroke, COPD, asthma, PVD, TB ("several years ago"), breast cancer, GERD. Current smoker. BMI 31. S/p Port-A-Cath insertion 04/12/16.   Medications include: Potassium, Prilosec, Maxzide  Preoperative labs reviewed.    CXR 06/13/16: No active cardiopulmonary disease.  EKG 07/04/16: NSR. Low voltage QRS. Septal infarct, age undetermined  Echo 06/14/16:  - Left ventricle: The cavity size was normal. Wall thickness was normal. Systolic function was normal. The estimated ejection fraction was in the range of 60% to 65%. Wall motion was normal; there were no regional wall motion abnormalities. - Right atrium: The atrium was mildly dilated.  03/09/16 CT Chest/Abd/pelvis: IMPRESSION: 1. Right breast cancer with associated right axillary metastatic lymph nodes. 2. No evidence of distant metastatic disease. 3. Hepatic steatosis. 4. Atherosclerosis (mild atherosclerosis of the aorta, great vessels and coronary arteries), greatest within the abdominal aorta.  If no changes, I anticipate pt can proceed with surgery as scheduled.   Willeen Cass, FNP-BC [facility] Short Stay Surgical Center/Anesthesiology Phone: [phone] 11/20/2016 12:12 PM

## 2016-11-22 ENCOUNTER — Ambulatory Visit (HOSPITAL_COMMUNITY): Payer: Medicare HMO | Admitting: Emergency Medicine

## 2016-11-22 ENCOUNTER — Ambulatory Visit
Admission: RE | Admit: 2016-11-22 | Discharge: 2016-11-22 | Disposition: A | Discharge status: Home / Self Care | Payer: Medicare HMO | Source: Ambulatory Visit | Attending: General Surgery | Admitting: General Surgery

## 2016-11-22 ENCOUNTER — Other Ambulatory Visit: Payer: Self-pay | Admitting: General Surgery

## 2016-11-22 ENCOUNTER — Encounter (HOSPITAL_COMMUNITY)
Admission: RE | Disposition: A | Discharge status: Home / Self Care | Payer: Self-pay | Source: Ambulatory Visit | Attending: General Surgery

## 2016-11-22 ENCOUNTER — Encounter (HOSPITAL_COMMUNITY): Payer: Self-pay | Admitting: General Practice

## 2016-11-22 ENCOUNTER — Encounter (HOSPITAL_COMMUNITY)
Admission: RE | Admit: 2016-11-22 | Discharge: 2016-11-22 | Disposition: A | Discharge status: Home / Self Care | Payer: Medicare HMO | Source: Ambulatory Visit | Attending: General Surgery | Admitting: General Surgery

## 2016-11-22 ENCOUNTER — Observation Stay (HOSPITAL_COMMUNITY)
Admission: RE | Admit: 2016-11-22 | Discharge: 2016-11-23 | Disposition: A | Discharge status: Home / Self Care | Payer: Medicare HMO | Source: Ambulatory Visit | Attending: General Surgery | Admitting: General Surgery

## 2016-11-22 ENCOUNTER — Ambulatory Visit: Admission: RE | Admit: 2016-11-22 | Payer: Medicare HMO | Source: Ambulatory Visit

## 2016-11-22 ENCOUNTER — Ambulatory Visit (HOSPITAL_COMMUNITY): Payer: Medicare HMO | Admitting: Anesthesiology

## 2016-11-22 DIAGNOSIS — C7989 Secondary malignant neoplasm of other specified sites: Secondary | ICD-10-CM | POA: Diagnosis not present

## 2016-11-22 DIAGNOSIS — K219 Gastro-esophageal reflux disease without esophagitis: Secondary | ICD-10-CM | POA: Insufficient documentation

## 2016-11-22 DIAGNOSIS — C50911 Malignant neoplasm of unspecified site of right female breast: Secondary | ICD-10-CM | POA: Diagnosis present

## 2016-11-22 DIAGNOSIS — Z8371 Family history of colonic polyps: Secondary | ICD-10-CM | POA: Insufficient documentation

## 2016-11-22 DIAGNOSIS — Z8261 Family history of arthritis: Secondary | ICD-10-CM | POA: Insufficient documentation

## 2016-11-22 DIAGNOSIS — C50411 Malignant neoplasm of upper-outer quadrant of right female breast: Principal | ICD-10-CM | POA: Insufficient documentation

## 2016-11-22 DIAGNOSIS — J449 Chronic obstructive pulmonary disease, unspecified: Secondary | ICD-10-CM | POA: Insufficient documentation

## 2016-11-22 DIAGNOSIS — I10 Essential (primary) hypertension: Secondary | ICD-10-CM | POA: Diagnosis not present

## 2016-11-22 DIAGNOSIS — Z170 Estrogen receptor positive status [ER+]: Principal | ICD-10-CM

## 2016-11-22 DIAGNOSIS — Z9221 Personal history of antineoplastic chemotherapy: Secondary | ICD-10-CM | POA: Insufficient documentation

## 2016-11-22 DIAGNOSIS — F419 Anxiety disorder, unspecified: Secondary | ICD-10-CM | POA: Insufficient documentation

## 2016-11-22 DIAGNOSIS — G8918 Other acute postprocedural pain: Secondary | ICD-10-CM | POA: Diagnosis not present

## 2016-11-22 DIAGNOSIS — R2231 Localized swelling, mass and lump, right upper limb: Secondary | ICD-10-CM | POA: Diagnosis not present

## 2016-11-22 DIAGNOSIS — F17200 Nicotine dependence, unspecified, uncomplicated: Secondary | ICD-10-CM | POA: Diagnosis not present

## 2016-11-22 DIAGNOSIS — Z8249 Family history of ischemic heart disease and other diseases of the circulatory system: Secondary | ICD-10-CM | POA: Insufficient documentation

## 2016-11-22 DIAGNOSIS — Z452 Encounter for adjustment and management of vascular access device: Secondary | ICD-10-CM | POA: Diagnosis not present

## 2016-11-22 DIAGNOSIS — C773 Secondary and unspecified malignant neoplasm of axilla and upper limb lymph nodes: Secondary | ICD-10-CM | POA: Diagnosis not present

## 2016-11-22 DIAGNOSIS — Z823 Family history of stroke: Secondary | ICD-10-CM | POA: Insufficient documentation

## 2016-11-22 DIAGNOSIS — M1990 Unspecified osteoarthritis, unspecified site: Secondary | ICD-10-CM | POA: Diagnosis not present

## 2016-11-22 DIAGNOSIS — Z811 Family history of alcohol abuse and dependence: Secondary | ICD-10-CM | POA: Diagnosis not present

## 2016-11-22 DIAGNOSIS — Z79899 Other long term (current) drug therapy: Secondary | ICD-10-CM | POA: Diagnosis not present

## 2016-11-22 DIAGNOSIS — R928 Other abnormal and inconclusive findings on diagnostic imaging of breast: Secondary | ICD-10-CM | POA: Diagnosis not present

## 2016-11-22 DIAGNOSIS — Z833 Family history of diabetes mellitus: Secondary | ICD-10-CM | POA: Diagnosis not present

## 2016-11-22 DIAGNOSIS — Z800 Family history of malignant neoplasm of digestive organs: Secondary | ICD-10-CM | POA: Diagnosis not present

## 2016-11-22 DIAGNOSIS — R590 Localized enlarged lymph nodes: Secondary | ICD-10-CM | POA: Diagnosis not present

## 2016-11-22 DIAGNOSIS — C50211 Malignant neoplasm of upper-inner quadrant of right female breast: Secondary | ICD-10-CM | POA: Diagnosis not present

## 2016-11-22 DIAGNOSIS — F329 Major depressive disorder, single episode, unspecified: Secondary | ICD-10-CM | POA: Diagnosis not present

## 2016-11-22 HISTORY — PX: MASTECTOMY WITH RADIOACTIVE SEED GUIDED EXCISION AND AXILLARY SENTINEL LYMPH NODE BIOPSY: SHX6736

## 2016-11-22 HISTORY — PX: PORT-A-CATH REMOVAL: SHX5289

## 2016-11-22 SURGERY — MASTECTOMY WITH RADIOACTIVE SEED GUIDED EXCISION AND AXILLARY SENTINEL LYMPH NODE BIOPSY
Anesthesia: General | Site: Chest | Laterality: Right

## 2016-11-22 MED ORDER — ONDANSETRON 4 MG PO TBDP: 4.0000 mg | ORAL_TABLET | Freq: Four times a day (QID) | ORAL | Status: DC | PRN

## 2016-11-22 MED ORDER — FENTANYL CITRATE (PF) 100 MCG/2ML IJ SOLN
50.0000 ug | Freq: Once | INTRAMUSCULAR | Status: AC
  Administered 2016-11-22: 50 ug via INTRAVENOUS

## 2016-11-22 MED ORDER — TRIAMTERENE-HCTZ 37.5-25 MG PO TABS
1.0000 | ORAL_TABLET | Freq: Every day | ORAL | Status: DC
  Administered 2016-11-23: 1 via ORAL
  Filled 2016-11-22: qty 1

## 2016-11-22 MED ORDER — SIMETHICONE 80 MG PO CHEW: 40.0000 mg | CHEWABLE_TABLET | Freq: Four times a day (QID) | ORAL | Status: DC | PRN

## 2016-11-22 MED ORDER — 0.9 % SODIUM CHLORIDE (POUR BTL) OPTIME
TOPICAL | Status: DC | PRN
  Administered 2016-11-22: 1000 mL

## 2016-11-22 MED ORDER — FENTANYL CITRATE (PF) 250 MCG/5ML IJ SOLN
INTRAMUSCULAR | Status: AC
  Filled 2016-11-22: qty 5

## 2016-11-22 MED ORDER — LIDOCAINE HCL (CARDIAC) 20 MG/ML IV SOLN
INTRAVENOUS | Status: DC | PRN
  Administered 2016-11-22: 40 mg via INTRAVENOUS

## 2016-11-22 MED ORDER — ROCURONIUM BROMIDE 50 MG/5ML IV SOLN
INTRAVENOUS | Status: AC
  Filled 2016-11-22: qty 1

## 2016-11-22 MED ORDER — DEXAMETHASONE SODIUM PHOSPHATE 10 MG/ML IJ SOLN
INTRAMUSCULAR | Status: DC | PRN
  Administered 2016-11-22: 10 mg via INTRAVENOUS

## 2016-11-22 MED ORDER — ROCURONIUM BROMIDE 100 MG/10ML IV SOLN
INTRAVENOUS | Status: DC | PRN
  Administered 2016-11-22: 40 mg via INTRAVENOUS

## 2016-11-22 MED ORDER — ACETAMINOPHEN 500 MG PO TABS
1000.0000 mg | ORAL_TABLET | Freq: Four times a day (QID) | ORAL | Status: DC
  Administered 2016-11-22 – 2016-11-23 (×3): 1000 mg via ORAL
  Filled 2016-11-22 (×3): qty 2

## 2016-11-22 MED ORDER — LACTATED RINGERS IV SOLN
INTRAVENOUS | Status: DC | PRN
  Administered 2016-11-22: 14:00:00 via INTRAVENOUS

## 2016-11-22 MED ORDER — MEPERIDINE HCL 25 MG/ML IJ SOLN: 6.2500 mg | INTRAMUSCULAR | Status: DC | PRN

## 2016-11-22 MED ORDER — OXYCODONE HCL 5 MG PO TABS
5.0000 mg | ORAL_TABLET | Freq: Four times a day (QID) | ORAL | 0 refills | Status: DC | PRN
Start: 2016-11-22 — End: 2018-01-03

## 2016-11-22 MED ORDER — CITALOPRAM HYDROBROMIDE 20 MG PO TABS
40.0000 mg | ORAL_TABLET | Freq: Every day | ORAL | Status: DC
  Administered 2016-11-23: 40 mg via ORAL
  Filled 2016-11-22: qty 2

## 2016-11-22 MED ORDER — FENTANYL CITRATE (PF) 100 MCG/2ML IJ SOLN
INTRAMUSCULAR | Status: AC
  Filled 2016-11-22: qty 2

## 2016-11-22 MED ORDER — MIDAZOLAM HCL 2 MG/2ML IJ SOLN
INTRAMUSCULAR | Status: AC
  Administered 2016-11-22: 2 mg via INTRAVENOUS
  Filled 2016-11-22: qty 2

## 2016-11-22 MED ORDER — ROPIVACAINE HCL 5 MG/ML IJ SOLN
INTRAMUSCULAR | Status: DC | PRN
  Administered 2016-11-22: 30 mL

## 2016-11-22 MED ORDER — SUGAMMADEX SODIUM 200 MG/2ML IV SOLN
INTRAVENOUS | Status: DC | PRN
  Administered 2016-11-22: 200 mg via INTRAVENOUS

## 2016-11-22 MED ORDER — FENTANYL CITRATE (PF) 100 MCG/2ML IJ SOLN
INTRAMUSCULAR | Status: DC | PRN
  Administered 2016-11-22: 50 ug via INTRAVENOUS
  Administered 2016-11-22 (×2): 25 ug via INTRAVENOUS

## 2016-11-22 MED ORDER — ACETAMINOPHEN 500 MG PO TABS
1000.0000 mg | ORAL_TABLET | ORAL | Status: AC
  Administered 2016-11-22: 1000 mg via ORAL

## 2016-11-22 MED ORDER — CEFAZOLIN SODIUM-DEXTROSE 2-4 GM/100ML-% IV SOLN
INTRAVENOUS | Status: AC
  Filled 2016-11-22: qty 100

## 2016-11-22 MED ORDER — TECHNETIUM TC 99M SULFUR COLLOID FILTERED
1.0000 | Freq: Once | INTRAVENOUS | Status: AC | PRN
  Administered 2016-11-22: 1 via INTRADERMAL

## 2016-11-22 MED ORDER — ONDANSETRON HCL 4 MG/2ML IJ SOLN
INTRAMUSCULAR | Status: DC | PRN
  Administered 2016-11-22: 4 mg via INTRAVENOUS

## 2016-11-22 MED ORDER — GABAPENTIN 300 MG PO CAPS
ORAL_CAPSULE | ORAL | Status: AC
  Filled 2016-11-22: qty 1

## 2016-11-22 MED ORDER — BUPROPION HCL ER (XL) 150 MG PO TB24
150.0000 mg | ORAL_TABLET | Freq: Every day | ORAL | Status: DC
  Administered 2016-11-23: 150 mg via ORAL
  Filled 2016-11-22: qty 1

## 2016-11-22 MED ORDER — PROPOFOL 10 MG/ML IV BOLUS
INTRAVENOUS | Status: DC | PRN
  Administered 2016-11-22: 150 mg via INTRAVENOUS

## 2016-11-22 MED ORDER — SODIUM CHLORIDE 0.9 % IJ SOLN
INTRAMUSCULAR | Status: AC
  Filled 2016-11-22: qty 10

## 2016-11-22 MED ORDER — ACETAMINOPHEN 500 MG PO TABS
ORAL_TABLET | ORAL | Status: AC
  Filled 2016-11-22: qty 2

## 2016-11-22 MED ORDER — METHOCARBAMOL 500 MG PO TABS
500.0000 mg | ORAL_TABLET | Freq: Four times a day (QID) | ORAL | Status: DC | PRN
  Administered 2016-11-22: 500 mg via ORAL
  Filled 2016-11-22: qty 1

## 2016-11-22 MED ORDER — SODIUM CHLORIDE 0.9 % IV SOLN
INTRAVENOUS | Status: DC
  Administered 2016-11-22: 17:00:00 via INTRAVENOUS

## 2016-11-22 MED ORDER — CHLORHEXIDINE GLUCONATE CLOTH 2 % EX PADS: 6.0000 | MEDICATED_PAD | Freq: Once | CUTANEOUS | Status: DC

## 2016-11-22 MED ORDER — ONDANSETRON HCL 4 MG/2ML IJ SOLN: 4.0000 mg | Freq: Four times a day (QID) | INTRAMUSCULAR | Status: DC | PRN

## 2016-11-22 MED ORDER — PANTOPRAZOLE SODIUM 40 MG PO TBEC
40.0000 mg | DELAYED_RELEASE_TABLET | Freq: Every day | ORAL | Status: DC
  Administered 2016-11-23: 40 mg via ORAL
  Filled 2016-11-22: qty 1

## 2016-11-22 MED ORDER — GABAPENTIN 300 MG PO CAPS
300.0000 mg | ORAL_CAPSULE | Freq: Three times a day (TID) | ORAL | Status: DC
  Administered 2016-11-22 – 2016-11-23 (×3): 300 mg via ORAL
  Filled 2016-11-22 (×3): qty 1

## 2016-11-22 MED ORDER — ONDANSETRON HCL 4 MG/2ML IJ SOLN
INTRAMUSCULAR | Status: AC
  Filled 2016-11-22: qty 2

## 2016-11-22 MED ORDER — CEFAZOLIN SODIUM-DEXTROSE 2-4 GM/100ML-% IV SOLN
2.0000 g | INTRAVENOUS | Status: AC
  Administered 2016-11-22: 2 g via INTRAVENOUS

## 2016-11-22 MED ORDER — FENTANYL CITRATE (PF) 100 MCG/2ML IJ SOLN
25.0000 ug | INTRAMUSCULAR | Status: DC | PRN
  Administered 2016-11-22 (×2): 50 ug via INTRAVENOUS

## 2016-11-22 MED ORDER — FENTANYL CITRATE (PF) 100 MCG/2ML IJ SOLN
INTRAMUSCULAR | Status: AC
  Administered 2016-11-22: 50 ug via INTRAVENOUS
  Filled 2016-11-22: qty 2

## 2016-11-22 MED ORDER — GABAPENTIN 300 MG PO CAPS
300.0000 mg | ORAL_CAPSULE | ORAL | Status: AC
  Administered 2016-11-22: 300 mg via ORAL

## 2016-11-22 MED ORDER — PHENYLEPHRINE HCL 10 MG/ML IJ SOLN
INTRAMUSCULAR | Status: DC | PRN
  Administered 2016-11-22: 25 ug/min via INTRAVENOUS

## 2016-11-22 MED ORDER — METHYLENE BLUE 0.5 % INJ SOLN
INTRAVENOUS | Status: AC
  Filled 2016-11-22: qty 10

## 2016-11-22 MED ORDER — DOCUSATE SODIUM 100 MG PO CAPS: 100.0000 mg | ORAL_CAPSULE | Freq: Every day | ORAL | Status: DC | PRN

## 2016-11-22 MED ORDER — SODIUM CHLORIDE 0.9 % IJ SOLN
INTRAMUSCULAR | Status: DC | PRN
  Administered 2016-11-22: 5 mL

## 2016-11-22 MED ORDER — DEXAMETHASONE SODIUM PHOSPHATE 10 MG/ML IJ SOLN
INTRAMUSCULAR | Status: AC
  Filled 2016-11-22: qty 1

## 2016-11-22 MED ORDER — BUPIVACAINE-EPINEPHRINE (PF) 0.25% -1:200000 IJ SOLN
INTRAMUSCULAR | Status: AC
  Filled 2016-11-22: qty 30

## 2016-11-22 MED ORDER — POTASSIUM CHLORIDE CRYS ER 20 MEQ PO TBCR
40.0000 meq | EXTENDED_RELEASE_TABLET | Freq: Two times a day (BID) | ORAL | Status: DC
  Administered 2016-11-23: 40 meq via ORAL
  Filled 2016-11-22: qty 2

## 2016-11-22 MED ORDER — MIDAZOLAM HCL 2 MG/2ML IJ SOLN
2.0000 mg | Freq: Once | INTRAMUSCULAR | Status: AC
  Administered 2016-11-22: 2 mg via INTRAVENOUS

## 2016-11-22 MED ORDER — OXYCODONE HCL 5 MG PO TABS
5.0000 mg | ORAL_TABLET | ORAL | Status: DC | PRN
  Administered 2016-11-22 – 2016-11-23 (×3): 10 mg via ORAL
  Filled 2016-11-22 (×3): qty 2

## 2016-11-22 MED ORDER — MORPHINE SULFATE (PF) 4 MG/ML IV SOLN
2.0000 mg | INTRAVENOUS | Status: DC | PRN
  Administered 2016-11-22: 2 mg via INTRAVENOUS
  Filled 2016-11-22: qty 1

## 2016-11-22 SURGICAL SUPPLY — 54 items
ADH SKN CLS APL DERMABOND .7 (GAUZE/BANDAGES/DRESSINGS) ×2
APPLIER CLIP 9.375 MED OPEN (MISCELLANEOUS) ×4
APR CLP MED 9.3 20 MLT OPN (MISCELLANEOUS) ×2
BINDER BREAST LRG (GAUZE/BANDAGES/DRESSINGS) IMPLANT
BINDER BREAST XLRG (GAUZE/BANDAGES/DRESSINGS) ×2 IMPLANT
BIOPATCH RED 1 DISK 7.0 (GAUZE/BANDAGES/DRESSINGS) ×3 IMPLANT
BIOPATCH RED 1IN DISK 7.0MM (GAUZE/BANDAGES/DRESSINGS) ×1
CANISTER SUCT 3000ML PPV (MISCELLANEOUS) ×4 IMPLANT
CHLORAPREP W/TINT 26ML (MISCELLANEOUS) ×4 IMPLANT
CLIP APPLIE 9.375 MED OPEN (MISCELLANEOUS) ×2 IMPLANT
CLOSURE WOUND 1/2 X4 (GAUZE/BANDAGES/DRESSINGS) ×1
COVER SURGICAL LIGHT HANDLE (MISCELLANEOUS) ×4 IMPLANT
COVER TRANSDUCER ULTRASND GEL (DRAPE) ×4 IMPLANT
DERMABOND ADVANCED (GAUZE/BANDAGES/DRESSINGS) ×2
DERMABOND ADVANCED .7 DNX12 (GAUZE/BANDAGES/DRESSINGS) ×2 IMPLANT
DEVICE DUBIN SPECIMEN MAMMOGRA (MISCELLANEOUS) ×4 IMPLANT
DRAIN CHANNEL 19F RND (DRAIN) ×6 IMPLANT
DRAPE CHEST BREAST 15X10 FENES (DRAPES) ×4 IMPLANT
DRSG PAD ABDOMINAL 8X10 ST (GAUZE/BANDAGES/DRESSINGS) ×8 IMPLANT
DRSG TEGADERM 2-3/8X2-3/4 SM (GAUZE/BANDAGES/DRESSINGS) ×2 IMPLANT
DRSG TEGADERM 4X4.75 (GAUZE/BANDAGES/DRESSINGS) ×2 IMPLANT
ELECT BLADE 4.0 EZ CLEAN MEGAD (MISCELLANEOUS) ×4
ELECT CAUTERY BLADE 6.4 (BLADE) ×4 IMPLANT
ELECT REM PT RETURN 9FT ADLT (ELECTROSURGICAL) ×4
ELECTRODE BLDE 4.0 EZ CLN MEGD (MISCELLANEOUS) ×2 IMPLANT
ELECTRODE REM PT RTRN 9FT ADLT (ELECTROSURGICAL) ×2 IMPLANT
EVACUATOR SILICONE 100CC (DRAIN) ×6 IMPLANT
GLOVE BIO SURGEON STRL SZ7 (GLOVE) ×8 IMPLANT
GLOVE BIOGEL PI IND STRL 7.5 (GLOVE) ×2 IMPLANT
GLOVE BIOGEL PI INDICATOR 7.5 (GLOVE) ×2
GOWN STRL REUS W/ TWL LRG LVL3 (GOWN DISPOSABLE) ×4 IMPLANT
GOWN STRL REUS W/TWL LRG LVL3 (GOWN DISPOSABLE) ×8
KIT BASIN OR (CUSTOM PROCEDURE TRAY) ×4 IMPLANT
KIT MARKER MARGIN INK (KITS) ×4 IMPLANT
KIT ROOM TURNOVER OR (KITS) ×4 IMPLANT
MARKER SKIN DUAL TIP RULER LAB (MISCELLANEOUS) ×4 IMPLANT
NDL FILTER BLUNT 18X1 1/2 (NEEDLE) IMPLANT
NDL HYPO 25X1 1.5 SAFETY (NEEDLE) IMPLANT
NEEDLE FILTER BLUNT 18X 1/2SAF (NEEDLE)
NEEDLE FILTER BLUNT 18X1 1/2 (NEEDLE) IMPLANT
NEEDLE HYPO 25X1 1.5 SAFETY (NEEDLE) IMPLANT
NS IRRIG 1000ML POUR BTL (IV SOLUTION) ×4 IMPLANT
PACK GENERAL/GYN (CUSTOM PROCEDURE TRAY) ×4 IMPLANT
PAD ARMBOARD 7.5X6 YLW CONV (MISCELLANEOUS) ×4 IMPLANT
SPONGE LAP 18X18 X RAY DECT (DISPOSABLE) ×2 IMPLANT
STRIP CLOSURE SKIN 1/2X4 (GAUZE/BANDAGES/DRESSINGS) ×3 IMPLANT
SUT ETHILON 2 0 FS 18 (SUTURE) ×6 IMPLANT
SUT MNCRL AB 4-0 PS2 18 (SUTURE) ×6 IMPLANT
SUT SILK 2 0 SH (SUTURE) ×4 IMPLANT
SUT VIC AB 3-0 SH 18 (SUTURE) ×4 IMPLANT
SUT VIC AB 3-0 SH 8-18 (SUTURE) ×2 IMPLANT
SYR CONTROL 10ML LL (SYRINGE) ×2 IMPLANT
TOWEL OR 17X24 6PK STRL BLUE (TOWEL DISPOSABLE) ×4 IMPLANT
TOWEL OR 17X26 10 PK STRL BLUE (TOWEL DISPOSABLE) ×4 IMPLANT

## 2016-11-22 NOTE — H&P (Signed)
18 yof presented in November with newly diagnosed right breast cancer. she underwent mm/us that shows density c breasts. there is nl left breast. the right breast has a spiculated mass that on [place] measures 2.9x2.5x2.6 cm in size. there are 3 enlarged nodes the largest is 1.9 cm. 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 she now has mri also that shows 2.7 cm breast mass and axillary adenopathy without any other abnormality. she has undergone chemotherapy which she finished 5/16. she is tired but otherwise tolerated ok.  repeat mri at end of chemo shows positive response but with essentially same extent of enhancement, no enlarged nodes and no left breast abnl. she comes in today desiring bilateral mastectomies as she believes this will prevent her from getting breast cancer and avoiding radiation. she is here with her husband.   Past Surgical History Appendectomy  Breast Biopsy  Right. Sentinel Lymph Node Biopsy   Diagnostic Studies History Colonoscopy  >10 years ago  Medication History  LORazepam (0.5MG  Tablet, Oral) Active. OxyCODONE HCl (5MG  Tablet, Oral) Active. Klor-Con M20 [facility] Tablet ER, Oral) Active. Omeprazole (20MG  Capsule DR, Oral) Active. Medications Reconciled  Social History  Alcohol use  Remotely quit alcohol use. Caffeine use  Carbonated beverages, Coffee, Tea. No drug use  Tobacco use  Current every day smoker.  Family History  Alcohol Abuse  Brother, Father. Arthritis  Father, Mother, Sister. Cerebrovascular Accident  Brother, Sister. Colon Cancer  Brother, Sister. Colon Polyps  Mother, Sister. Depression  Brother, Sister. Diabetes Mellitus  Sister. Heart Disease  Father, Mother, Sister. Hypertension  Brother, Mother, Sister. Respiratory Condition  Father, Sister.  Other Problems  Anxiety Disorder  Arthritis  Asthma  Back Pain  Bladder Problems  Breast  Cancer  Cerebrovascular Accident  Chest pain  Chronic Obstructive Lung Disease  Depression  Emphysema Of Lung  Gastroesophageal Reflux Disease  Hemorrhoids  High blood pressure  Lump In Breast  Migraine Headache    Review of Systems General Present- Fatigue, Night Sweats and Weight Gain. Not Present- Appetite Loss, Chills, Fever and Weight Loss. Skin Present- Change in Wart/Mole. Not Present- Dryness, Hives, Jaundice, New Lesions, Non-Healing Wounds, Rash and Ulcer. HEENT Present- Hearing Loss, Seasonal Allergies and Wears glasses/contact lenses. Not Present- Earache, Hoarseness, Nose Bleed, Oral Ulcers, Ringing in the Ears, Sinus Pain, Sore Throat, Visual Disturbances and Yellow Eyes. Respiratory Present- Chronic Cough, Snoring and Wheezing. Not Present- Bloody sputum and Difficulty Breathing. Breast Present- Breast Mass and Breast Pain. Not Present- Nipple Discharge and Skin Changes. Cardiovascular Present- Leg Cramps and Shortness of Breath. Not Present- Chest Pain, Difficulty Breathing Lying Down, Palpitations, Rapid Heart Rate and Swelling of Extremities. Gastrointestinal Present- Abdominal Pain, Constipation, Indigestion and Nausea. Not Present- Bloating, Bloody Stool, Change in Bowel Habits, Chronic diarrhea, Difficulty Swallowing, Excessive gas, Gets full quickly at meals, Hemorrhoids, Rectal Pain and Vomiting. Female Genitourinary Present- Frequency, Nocturia and Urine Leakage. Not Present- Blood in Urine, Change in Urinary Stream, Impotence, Painful Urination and Urgency. Musculoskeletal Present- Back Pain, Joint Pain, Joint Stiffness, Muscle Pain and Muscle Weakness. Not Present- Swelling of Extremities. Neurological Present- Headaches, Trouble walking and Weakness. Not Present- Decreased Memory, Fainting, Numbness, Seizures, Tingling and Tremor. Psychiatric Present- Anxiety, Depression and Frequent crying. Not Present- Bipolar, Change in Sleep Pattern and  Fearful. Endocrine Present- Heat Intolerance and Hot flashes. Not Present- Cold Intolerance, Excessive Hunger, Hair Changes and New Diabetes. Hematology Not Present- Blood Thinners, Easy  Bruising, Excessive bleeding, Gland problems, HIV and Persistent Infections.  Vitals  Weight: 171 lb Height: 62in Body Surface Area: 1.79 m Body Mass Index: 31.28 kg/m  Pulse: 83 (Regular)  BP: 122/78 (Sitting, Left Arm, Standard) Physical Exam  General Mental Status-Alert. Orientation-Oriented X3. Eye Sclera/Conjunctiva - Bilateral-No scleral icterus. Chest and Lung Exam Chest and lung exam reveals -quiet, even and easy respiratory effort with no use of accessory muscles and on auscultation, normal breath sounds, no adventitious sounds and normal vocal resonance. Breast Nipples-No Discharge. Note: 1 cm uoq mass in right breast Cardiovascular Cardiovascular examination reveals -normal heart sounds, regular rate and rhythm with no murmurs. Abdomen Note: soft nt/nd Lymphatic Head & Neck General Head & Neck Lymphatics: Bilateral - Description - Normal. Axillary General Axillary Region: Bilateral - Description - Normal. Note: no [place] adenopathy   Assessment & Plan BREAST CANCER OF UPPER-OUTER QUADRANT OF RIGHT FEMALE BREAST (C50.411) Right total mastectomy, right TAD with possible alnd based on intraoperative pathology

## 2016-11-22 NOTE — Anesthesia Postprocedure Evaluation (Signed)
Anesthesia Post Note  Patient: Ashley Doyle  Procedure(s) Performed: Procedure(s) (LRB): REMOVAL OF RIGHT BREAST RADIOACTIVE SEED, RIGHT TOTAL MASTECTOMY WITH RADIOACTIVE SEED TARGETED RIGHT AXILLARY LYMPH NODE EXCISION AND AXILLARY SENTINEL LYMPH NODE BIOPSY, POSSIBLE RIGHT AXILLARY DISSECTION (Right) REMOVAL PORT-A-CATH (Left)     Patient location during evaluation: PACU Anesthesia Type: General Level of consciousness: awake and alert Pain management: pain level controlled Vital Signs Assessment: post-procedure vital signs reviewed and stable Respiratory status: spontaneous breathing, nonlabored ventilation, respiratory function stable and patient connected to nasal cannula oxygen Cardiovascular status: blood pressure returned to baseline and stable Postop Assessment: no signs of nausea or vomiting Anesthetic complications: no    Last Vitals:  Vitals:   11/22/16 1610 11/22/16 1625  BP: (!) 145/75 (!) 115/59  Pulse: 92 86  Resp: 18 19  Temp:      Last Pain:  Vitals:   11/22/16 1625  TempSrc:   PainSc: 3                  Karon Cotterill

## 2016-11-22 NOTE — Anesthesia Procedure Notes (Addendum)
Procedure Name: Intubation Date/Time: 11/22/2016 1:57 PM Performed by: Valda Favia Pre-anesthesia Checklist: Patient identified, Emergency Drugs available, Suction available and Patient being monitored Patient Re-evaluated:Patient Re-evaluated prior to induction Oxygen Delivery Method: Circle System Utilized Preoxygenation: Pre-oxygenation with 100% oxygen Induction Type: IV induction Ventilation: Mask ventilation without difficulty and Oral airway inserted - appropriate to patient size Laryngoscope Size: Glidescope and 4 Grade View: Grade II Tube type: Oral Tube size: 7.0 mm Number of attempts: 1 Airway Equipment and Method: Oral airway and Video-laryngoscopy Placement Confirmation: ETT inserted through vocal cords under direct vision,  positive ETCO2 and breath sounds checked- equal and bilateral Secured at: 21 cm Tube secured with: Tape Dental Injury: Teeth and Oropharynx as per pre-operative assessment  Difficulty Due To: Difficult Airway- due to reduced neck mobility Comments: Elective glidescope utilized due to patient's complain of neck pain with neck extension

## 2016-11-22 NOTE — Discharge Instructions (Signed)
[facility] °[phone] ° °MASTECTOMY: POST OP INSTRUCTIONS ° °Always review your discharge instruction sheet given to you by the facility where your surgery was performed. °IF YOU HAVE DISABILITY OR FAMILY LEAVE FORMS, YOU MUST BRING THEM TO THE OFFICE FOR PROCESSING.   °DO NOT GIVE THEM TO YOUR DOCTOR. °A prescription for pain medication may be given to you upon discharge.  Take your pain medication as prescribed, if needed.  If narcotic pain medicine is not needed, then you may take acetaminophen (Tylenol), naprosyn (Alleve) or ibuprofen (Advil) as needed. °1. Take your usually prescribed medications unless otherwise directed. °2. If you need a refill on your pain medication, please contact your pharmacy.  They will contact our office to request authorization.  Prescriptions will not be filled after 5pm or on week-ends. °3. You should follow a light diet the first few days after arrival home, such as soup and crackers, etc.  Resume your normal diet the day after surgery. °4. Most patients will experience some swelling and bruising on the chest and underarm.  Ice packs will help.  Swelling and bruising can take several days to resolve. Wear the binder day and night until you return to the office.  °5. It is common to experience some constipation if taking pain medication after surgery.  Increasing fluid intake and taking a stool softener (such as Colace) will usually help or prevent this problem from occurring.  A mild laxative (Milk of Magnesia or Miralax) should be taken according to package instructions if there are no bowel movements after 48 hours. °6. Unless discharge instructions indicate otherwise, leave your bandage dry and in place until your next appointment in 3-5 days.  You may take a limited sponge bath.  No tube baths or showers until the drains are removed.  You may have steri-strips (small skin tapes) in place directly over the incision.  These strips should be left on the  skin for 7-10 days. If you have glue it will come off in next couple week.  Any sutures will be removed at an office visit °7. DRAINS:  If you have drains in place, it is important to keep a list of the amount of drainage produced each day in your drains.  Before leaving the hospital, you should be instructed on drain care.  Call our office if you have any questions about your drains. I will remove your drains when they put out less than 30 cc or ml for 2 consecutive days. °8. ACTIVITIES:  You may resume regular (light) daily activities beginning the next day--such as daily self-care, walking, climbing stairs--gradually increasing activities as tolerated.  You may have sexual intercourse when it is comfortable.  Refrain from any heavy lifting or straining until approved by your doctor. °a. You may drive when you are no longer taking prescription pain medication, you can comfortably wear a seatbelt, and you can safely maneuver your car and apply brakes. °b. RETURN TO WORK:  __________________________________________________________ °9. You should see your doctor in the office for a follow-up appointment approximately 3-5 days after your surgery.  Your doctor’s nurse will typically make your follow-up appointment when she calls you with your pathology report.  Expect your pathology report 3-4business days after surgery. °10. OTHER INSTRUCTIONS: ______________________________________________________________________________________________ ____________________________________________________________________________________________ °WHEN TO CALL YOUR DR Chanelle Hodsdon: °1. Fever over 101.0 °2. Nausea and/or vomiting °3. Extreme swelling or bruising °4. Continued bleeding from incision. °5. Increased pain, redness, or drainage from the incision. °The clinic staff is available   to answer your questions during regular business hours.  Please don’t hesitate to call and ask to speak to one of the nurses for clinical concerns.  If  you have a medical emergency, go to the nearest emergency room or call 911.  A surgeon from [facility] is always on call at the hospital. °[address], [address], [place], [place]  [address] ? [address] [address], [place], [place]   [address] °[phone] ? [phone] ? FAX [phone] °Web site: [email or web address] ° °

## 2016-11-22 NOTE — Transfer of Care (Signed)
Immediate Anesthesia Transfer of Care Note  Patient: Ashley Doyle  Procedure(s) Performed: Procedure(s): REMOVAL OF RIGHT BREAST RADIOACTIVE SEED, RIGHT TOTAL MASTECTOMY WITH RADIOACTIVE SEED TARGETED RIGHT AXILLARY LYMPH NODE EXCISION AND AXILLARY SENTINEL LYMPH NODE BIOPSY, POSSIBLE RIGHT AXILLARY DISSECTION (Right) REMOVAL PORT-A-CATH (Left)  Patient Location: PACU  Anesthesia Type:GA combined with regional for post-op pain  Level of Consciousness: awake, alert  and oriented  Airway & Oxygen Therapy: Patient Spontanous Breathing and Patient connected to nasal cannula oxygen  Post-op Assessment: Report given to RN and Post -op Vital signs reviewed and stable  Post vital signs: Reviewed and stable  Last Vitals:  Vitals:   11/22/16 1330 11/22/16 1555  BP: (!) 130/58 (!) 149/68  Pulse: 84 91  Resp: 19 11  Temp:  (!) 36.3 C    Last Pain:  Vitals:   11/22/16 1241  TempSrc:   PainSc: 7       Patients Stated Pain Goal: 2 (38/18/29 9371)  Complications: No apparent anesthesia complications

## 2016-11-22 NOTE — Op Note (Signed)
Preoperative diagnosis: node positive right breast cancer s/p primary chemotherapy Postoperative diagnosis: saa Procedure: Right modified radical mastectomy, port removal Surgeon Dr Serita Grammes EBL 50 cc Drains 2 19 Fr Blake drain to mastectomy space and axilla Complications none Specimens Right breast marked short superior, long marked axillary contents, additional axillary contents, right axillary targeted node Sponge and needle count correct times two Dispo to recovery stable  Indications: 10 yof who has undergone primary chemotherapy with modest response.  We discussed right mastectomy, possible tad vs alnd based on intraoperative pathology. She has been difficult to get to OR.  We have discussed all options and have elected to proceed with right mastectomy, right targeted node excision and sentinel node biopsy with possible alnd.   Procedure: After informed consent obtained patient was taken to the OR. She was given antibiotics and scds were in place. She had a pectoral block placed. She was placed under general anesthesia without complication. She was prepped and draped in standard sterile surgical fashion. A timeout was performed.   I made a large elliptical incision to encompass the nipple areola complex. She had a very large breast with a lot of soft tissue present as well. I then created flaps to the clavicle, parasternal area, inframammary fold and the latissiumus laterally. I then removed the breast and fascia from the pectoralis fascia. There was a seed that had been placed in the breast accidentally. This had a mm taken and confirmed removal.  The seed containing node was also in the tail of Spence and this was excised separately.  I sent this for frozen section after confirming removal of seed with mammogram. This node was positive. I then proceed with axillary dissection.   I then entered into the axilla. There were several enlarged nodes.   I was able to eventually identify  the axillary vein, the thoracodorsal bundle and the long thoracic nerve. I removed all the tissue caudad to the axillary vein taking care not to injure the nerves. This was all then passed off the table. I then obtained hemostasis. I inserted 2 19 Fr blake drainsand secured thesewith a 2-0 nylon. biopatchesand tegadermswere eventually placed. I removed extra tissue in the axilla and closed this with 3-0 vicryl in a y fashion to try and reduce the excess tissue. I closed the dermis throughout with 3-0 vicryl.  Skin was closed with 4-0 monocryl.I then placed glue and steristrips. I placed several 3-0 nylon sutures laterally that I will remove in 10 days. Binder was placed. She was extubated and transferred to recovery stable

## 2016-11-22 NOTE — Progress Notes (Signed)
Patient arrived to unit from PACU. Report received from [facility], [place]. No complications at site.

## 2016-11-22 NOTE — Anesthesia Procedure Notes (Signed)
Anesthesia Regional Block: Pectoralis block   Pre-Anesthetic Checklist: ,, timeout performed, Correct Patient, Correct Site, Correct Laterality, Correct Procedure, Correct Position, site marked, Risks and benefits discussed,  Surgical consent,  Pre-op evaluation,  At surgeon's request and post-op pain management  Laterality: Right  Prep: chloraprep       Needles:  Injection technique: Single-shot  Needle Type: Echogenic Needle     Needle Length: 9cm  Needle Gauge: 21     Additional Needles:   Procedures: ultrasound guided,,,,,,,,  Narrative:  Start time: 11/22/2016 1:10 PM End time: 11/22/2016 1:20 PM Injection made incrementally with aspirations every 5 mL.  Performed by: Personally  Anesthesiologist: Camauri Craton

## 2016-11-22 NOTE — Interval H&P Note (Signed)
History and Physical Interval Note:  11/22/2016 1:16 PM  Ashley Doyle  has presented today for surgery, with the diagnosis of RIGHT BREAST CANCER  The various methods of treatment have been discussed with the patient and family. After consideration of risks, benefits and other options for treatment, the patient has consented to  Procedure(s): Removal of right breast radioactive seed, RIGHT TOTAL MASTECTOMY WITH RADIOACTIVE SEED TARGETED RIGHT AXILLARY LYMPH NODE EXCISION AND AXILLARY SENTINEL LYMPH NODE BIOPSY, POSSIBLE RIGHT AXILLARY DISSECTION (Right) as a surgical intervention .  The patient's history has been reviewed, patient examined, no change in status, stable for surgery.  I have reviewed the patient's chart and labs.  Questions were answered to the patient's satisfaction.     [name]

## 2016-11-22 NOTE — Anesthesia Preprocedure Evaluation (Signed)
Anesthesia Evaluation  Patient identified by MRN, date of birth, ID band Patient awake    Reviewed: Allergy & Precautions, NPO status , Patient's Chart, lab work & pertinent test results  Airway Mallampati: II  TM Distance: >3 FB Neck ROM: Full    Dental  (+) Upper Dentures, Lower Dentures   Pulmonary Current Smoker,    breath sounds clear to auscultation       Cardiovascular hypertension,  Rhythm:Regular Rate:Normal     Neuro/Psych    GI/Hepatic   Endo/Other    Renal/GU      Musculoskeletal   Abdominal   Peds  Hematology   Anesthesia Other Findings ECHO 2/18  - Left ventricle: The cavity size was normal. Wall thickness was   normal. Systolic function was normal. The estimated ejection   fraction was in the range of 60% to 65%. Wall motion was normal;   there were no regional wall motion abnormalities. - Right atrium: The atrium was mildly dilated.   Reproductive/Obstetrics                             Anesthesia Physical  Anesthesia Plan  ASA: III  Anesthesia Plan: General   Post-op Pain Management:  Regional for Post-op pain   Induction: Intravenous  PONV Risk Score and Plan: 3 and Ondansetron, Dexamethasone, Propofol, Midazolam and Treatment may vary due to age or medical condition  Airway Management Planned: Oral ETT  Additional Equipment:   Intra-op Plan:   Post-operative Plan: Extubation in OR  Informed Consent: I have reviewed the patients History and Physical, chart, labs and discussed the procedure including the risks, benefits and alternatives for the proposed anesthesia with the patient or authorized representative who has indicated his/her understanding and acceptance.   Dental advisory given  Plan Discussed with: CRNA and Anesthesiologist  Anesthesia Plan Comments: (  )        Anesthesia Quick Evaluation  Anesthesia Evaluation  Patient identified by MRN, date of birth, ID band Patient awake    Reviewed: Allergy & Precautions, NPO status , Patient's Chart, lab work & pertinent test results  Airway Mallampati: II  TM Distance: >3 FB Neck ROM: Full    Dental  (+) Upper Dentures, Lower Dentures   Pulmonary Current Smoker,    breath sounds clear to auscultation       Cardiovascular hypertension,  Rhythm:Regular Rate:Normal     Neuro/Psych    GI/Hepatic   Endo/Other    Renal/GU      Musculoskeletal   Abdominal   Peds  Hematology   Anesthesia Other Findings   Reproductive/Obstetrics                           Anesthesia Physical Anesthesia Plan  ASA: III  Anesthesia Plan: MAC   Post-op Pain Management:    Induction: Intravenous  Airway Management Planned:   Additional Equipment:   Intra-op Plan:   Post-operative Plan:   Informed Consent: I have reviewed the patients History and Physical, chart, labs and discussed the procedure including the risks, benefits and alternatives for the proposed anesthesia with the patient or authorized representative who has indicated his/her understanding and acceptance.   Dental advisory given  Plan Discussed with: CRNA and Anesthesiologist  Anesthesia Plan Comments:         Anesthesia Quick Evaluation

## 2016-11-23 ENCOUNTER — Encounter (HOSPITAL_COMMUNITY): Payer: Self-pay | Admitting: General Surgery

## 2016-11-23 DIAGNOSIS — I10 Essential (primary) hypertension: Secondary | ICD-10-CM | POA: Diagnosis not present

## 2016-11-23 DIAGNOSIS — J449 Chronic obstructive pulmonary disease, unspecified: Secondary | ICD-10-CM | POA: Diagnosis not present

## 2016-11-23 DIAGNOSIS — F329 Major depressive disorder, single episode, unspecified: Secondary | ICD-10-CM | POA: Diagnosis not present

## 2016-11-23 DIAGNOSIS — C50411 Malignant neoplasm of upper-outer quadrant of right female breast: Secondary | ICD-10-CM | POA: Diagnosis not present

## 2016-11-23 DIAGNOSIS — C773 Secondary and unspecified malignant neoplasm of axilla and upper limb lymph nodes: Secondary | ICD-10-CM | POA: Diagnosis not present

## 2016-11-23 DIAGNOSIS — F17200 Nicotine dependence, unspecified, uncomplicated: Secondary | ICD-10-CM | POA: Diagnosis not present

## 2016-11-23 DIAGNOSIS — Z9221 Personal history of antineoplastic chemotherapy: Secondary | ICD-10-CM | POA: Diagnosis not present

## 2016-11-23 DIAGNOSIS — F419 Anxiety disorder, unspecified: Secondary | ICD-10-CM | POA: Diagnosis not present

## 2016-11-23 DIAGNOSIS — Z452 Encounter for adjustment and management of vascular access device: Secondary | ICD-10-CM | POA: Diagnosis not present

## 2016-11-23 LAB — BASIC METABOLIC PANEL
Anion gap: 4 — ABNORMAL LOW (ref 5–15)
BUN: 11 mg/dL (ref 6–20)
CALCIUM: 8.9 mg/dL (ref 8.9–10.3)
CO2: 26 mmol/L (ref 22–32)
CREATININE: 0.93 mg/dL (ref 0.44–1.00)
Chloride: 105 mmol/L (ref 101–111)
GFR calc Af Amer: >60 mL/min (ref >60)
GLUCOSE: 161 mg/dL — AB (ref 65–99)
POTASSIUM: 4.5 mmol/L (ref 3.5–5.1)
SODIUM: 135 mmol/L (ref 135–145)

## 2016-11-23 MED ORDER — METHOCARBAMOL 750 MG PO TABS: 750.0000 mg | ORAL_TABLET | Freq: Three times a day (TID) | ORAL | 0 refills | Status: AC | PRN

## 2016-11-23 NOTE — Progress Notes (Signed)
Patient discharged to home with instructions and prescriptions, binder straps applied as ordered, demonstrated JP care to patient and family, verbalized understanding.

## 2016-11-23 NOTE — Discharge Summary (Signed)
Physician Discharge Summary  Patient ID: Ashley Doyle MRN: [id number] DOB/AGE: 10-05-1945 71 y.o.  Admit date: 11/22/2016 Discharge date: 11/23/2016  Admission Diagnoses: Breast cancer PVD HTN COPD  Discharge Diagnoses:  Active Problems:   Breast cancer, right [facility])   Discharged Condition: good  Hospital Course: 71 yof s/p right mrm who is doing well the following morning. No hematoma  Consults: None  Significant Diagnostic Studies: none  Treatments: surgery: right mrm  Discharge Exam: Blood pressure (!) 129/58, pulse 82, temperature 98.1 F (36.7 C), temperature source Oral, resp. rate 18, height 5\' 2"  (1.575 m), weight 77.1 kg (170 lb), SpO2 96 %. flaps viable drains as expected, no hematoma  Disposition: 01-Home or Self Care   Allergies as of 11/23/2016      Reactions   No Known Allergies       Medication List    TAKE these medications   buPROPion 150 MG 24 hr tablet Commonly known as:  WELLBUTRIN XL Take 150 mg by mouth daily.   citalopram 40 MG tablet Commonly known as:  CELEXA Take 40 mg by mouth daily.   dexamethasone 4 MG tablet Commonly known as:  DECADRON Take 2 tablets by mouth once a day on the day after chemotherapy and then take 2 tablets two times a day for 2 days. Take with food.   docusate sodium 100 MG capsule Commonly known as:  COLACE Take 100 mg by mouth daily as needed for mild constipation.   gabapentin 300 MG capsule Commonly known as:  NEURONTIN Take 1 capsule (300 mg total) by mouth 3 (three) times daily.   KLOR-CON M20 20 MEQ tablet Generic drug:  potassium chloride SA TAKE 2 TABLETS BY MOUTH TWICE A DAY What changed:  See the new instructions.   lidocaine-prilocaine cream Commonly known as:  EMLA Apply to affected area once   LORazepam 0.5 MG tablet Commonly known as:  ATIVAN Take 1 tablet (0.5 mg total) by mouth every 8 (eight) hours. What changed:  when to take this  reasons to take this   methocarbamol  750 MG tablet Commonly known as:  ROBAXIN Take 1 tablet (750 mg total) by mouth every 8 (eight) hours as needed (use for muscle cramps/pain).   naproxen sodium 220 MG tablet Commonly known as:  ANAPROX Take 220 mg by mouth 2 (two) times daily as needed.   omeprazole 20 MG capsule Commonly known as:  PRILOSEC TAKE 1 CAPSULE (20 MG TOTAL) BY MOUTH DAILY.   ondansetron 8 MG tablet Commonly known as:  ZOFRAN Take 1 tablet (8 mg total) by mouth every 8 (eight) hours as needed. Start on the third day after chemotherapy.   oxyCODONE 5 MG immediate release tablet Commonly known as:  Oxy IR/ROXICODONE Take 1 tablet (5 mg total) by mouth every 6 (six) hours as needed for moderate pain, severe pain or breakthrough pain.   oxyCODONE-acetaminophen 10-325 MG tablet Commonly known as:  PERCOCET Take 1 tablet by mouth every 4 (four) hours as needed for pain. 1 month supply   prochlorperazine 10 MG tablet Commonly known as:  COMPAZINE Take 1 tablet (10 mg total) by mouth every 6 (six) hours as needed (Nausea or vomiting).   promethazine 25 MG tablet Commonly known as:  PHENERGAN Take 0.5 tablets (12.5 mg total) by mouth every 6 (six) hours as needed for nausea or vomiting.   triamterene-hydrochlorothiazide 37.5-25 MG tablet Commonly known as:  MAXZIDE-25 Take 1 tablet by mouth daily.  Follow-up Information    Rolm Bookbinder, MD In 2 weeks.   Specialty:  General Surgery Contact information: [address] [place] [address] [phone]           Signed: Rolm Bookbinder 11/23/2016, 8:57 AM

## 2016-12-21 ENCOUNTER — Ambulatory Visit (HOSPITAL_COMMUNITY): Payer: Medicare HMO | Admitting: Adult Health

## 2016-12-21 ENCOUNTER — Other Ambulatory Visit (HOSPITAL_COMMUNITY): Payer: Self-pay | Admitting: Adult Health

## 2016-12-21 DIAGNOSIS — C50911 Malignant neoplasm of unspecified site of right female breast: Secondary | ICD-10-CM

## 2016-12-21 DIAGNOSIS — G629 Polyneuropathy, unspecified: Secondary | ICD-10-CM

## 2016-12-21 MED ORDER — GABAPENTIN 300 MG PO CAPS
300.0000 mg | ORAL_CAPSULE | Freq: Three times a day (TID) | ORAL | 2 refills | Status: AC
Start: 2016-12-21 — End: ?

## 2016-12-21 MED ORDER — GABAPENTIN 300 MG PO CAPS: 300.0000 mg | ORAL_CAPSULE | Freq: Three times a day (TID) | ORAL | 2 refills | Status: DC

## 2017-01-29 DIAGNOSIS — I10 Essential (primary) hypertension: Secondary | ICD-10-CM | POA: Diagnosis not present

## 2017-01-29 DIAGNOSIS — E1122 Type 2 diabetes mellitus with diabetic chronic kidney disease: Secondary | ICD-10-CM | POA: Diagnosis not present

## 2017-01-31 DIAGNOSIS — I10 Essential (primary) hypertension: Secondary | ICD-10-CM | POA: Diagnosis not present

## 2017-01-31 DIAGNOSIS — C50919 Malignant neoplasm of unspecified site of unspecified female breast: Secondary | ICD-10-CM | POA: Diagnosis not present

## 2017-01-31 DIAGNOSIS — S21109A Unspecified open wound of unspecified front wall of thorax without penetration into thoracic cavity, initial encounter: Secondary | ICD-10-CM | POA: Diagnosis not present

## 2017-01-31 DIAGNOSIS — G894 Chronic pain syndrome: Secondary | ICD-10-CM | POA: Diagnosis not present

## 2017-01-31 DIAGNOSIS — Z720 Tobacco use: Secondary | ICD-10-CM | POA: Diagnosis not present

## 2017-01-31 DIAGNOSIS — F419 Anxiety disorder, unspecified: Secondary | ICD-10-CM | POA: Diagnosis not present

## 2017-01-31 DIAGNOSIS — E782 Mixed hyperlipidemia: Secondary | ICD-10-CM | POA: Diagnosis not present

## 2017-01-31 DIAGNOSIS — T8131XA Disruption of external operation (surgical) wound, not elsewhere classified, initial encounter: Secondary | ICD-10-CM | POA: Diagnosis not present

## 2017-01-31 DIAGNOSIS — F331 Major depressive disorder, recurrent, moderate: Secondary | ICD-10-CM | POA: Diagnosis not present

## 2017-01-31 DIAGNOSIS — H9193 Unspecified hearing loss, bilateral: Secondary | ICD-10-CM | POA: Diagnosis not present

## 2017-02-27 DIAGNOSIS — E119 Type 2 diabetes mellitus without complications: Secondary | ICD-10-CM | POA: Diagnosis not present

## 2017-04-08 DIAGNOSIS — D3131 Benign neoplasm of right choroid: Secondary | ICD-10-CM | POA: Diagnosis not present

## 2017-04-08 DIAGNOSIS — H25813 Combined forms of age-related cataract, bilateral: Secondary | ICD-10-CM | POA: Diagnosis not present

## 2017-04-08 DIAGNOSIS — H34211 Partial retinal artery occlusion, right eye: Secondary | ICD-10-CM | POA: Diagnosis not present

## 2017-04-09 ENCOUNTER — Other Ambulatory Visit (HOSPITAL_COMMUNITY): Payer: Self-pay | Admitting: Oncology

## 2017-04-09 DIAGNOSIS — R1013 Epigastric pain: Secondary | ICD-10-CM

## 2017-05-28 DIAGNOSIS — H34219 Partial retinal artery occlusion, unspecified eye: Secondary | ICD-10-CM | POA: Diagnosis not present

## 2017-05-28 DIAGNOSIS — C50919 Malignant neoplasm of unspecified site of unspecified female breast: Secondary | ICD-10-CM | POA: Diagnosis not present

## 2017-05-28 DIAGNOSIS — S99921A Unspecified injury of right foot, initial encounter: Secondary | ICD-10-CM | POA: Diagnosis not present

## 2017-05-28 DIAGNOSIS — M19039 Primary osteoarthritis, unspecified wrist: Secondary | ICD-10-CM | POA: Diagnosis not present

## 2017-05-28 DIAGNOSIS — Z6830 Body mass index (BMI) 30.0-30.9, adult: Secondary | ICD-10-CM | POA: Diagnosis not present

## 2017-06-24 DIAGNOSIS — I10 Essential (primary) hypertension: Secondary | ICD-10-CM | POA: Diagnosis not present

## 2017-06-24 DIAGNOSIS — E1122 Type 2 diabetes mellitus with diabetic chronic kidney disease: Secondary | ICD-10-CM | POA: Diagnosis not present

## 2017-07-06 ENCOUNTER — Other Ambulatory Visit (HOSPITAL_COMMUNITY): Payer: Self-pay | Admitting: Adult Health

## 2017-07-06 DIAGNOSIS — R1013 Epigastric pain: Secondary | ICD-10-CM

## 2017-07-07 NOTE — Telephone Encounter (Signed)
She has no follow-up visits scheduled with [place]. Has she transferred her care? Please call her and check on her. I will fill 1 month's worth of this medication, but would like an update since it has been a while since we have seen her.   Mike Craze, NP [place] [phone]

## 2017-07-09 NOTE — Telephone Encounter (Signed)
Left message on home number asking patient if she is being seen at another oncology facility or does she need a follow up her. Explained that we received refill request for prilosec and our provider filled one months worth of med until we find out where she is being seen. Requested she return call.

## 2017-07-10 NOTE — Telephone Encounter (Signed)
Left another message on patients voicemail requesting update; where does she go for oncology, does she need refill for prilosec. Requested she call [facility] and let one of the nurses know.

## 2017-07-23 DIAGNOSIS — H9193 Unspecified hearing loss, bilateral: Secondary | ICD-10-CM | POA: Diagnosis not present

## 2017-07-23 DIAGNOSIS — F419 Anxiety disorder, unspecified: Secondary | ICD-10-CM | POA: Diagnosis not present

## 2017-07-23 DIAGNOSIS — E1122 Type 2 diabetes mellitus with diabetic chronic kidney disease: Secondary | ICD-10-CM | POA: Diagnosis not present

## 2017-07-23 DIAGNOSIS — I10 Essential (primary) hypertension: Secondary | ICD-10-CM | POA: Diagnosis not present

## 2017-07-23 DIAGNOSIS — R944 Abnormal results of kidney function studies: Secondary | ICD-10-CM | POA: Diagnosis not present

## 2017-07-23 DIAGNOSIS — G894 Chronic pain syndrome: Secondary | ICD-10-CM | POA: Diagnosis not present

## 2017-07-23 DIAGNOSIS — F331 Major depressive disorder, recurrent, moderate: Secondary | ICD-10-CM | POA: Diagnosis not present

## 2017-07-23 DIAGNOSIS — Z720 Tobacco use: Secondary | ICD-10-CM | POA: Diagnosis not present

## 2017-07-23 DIAGNOSIS — C50919 Malignant neoplasm of unspecified site of unspecified female breast: Secondary | ICD-10-CM | POA: Diagnosis not present

## 2017-08-21 DIAGNOSIS — H34211 Partial retinal artery occlusion, right eye: Secondary | ICD-10-CM | POA: Diagnosis not present

## 2017-08-21 DIAGNOSIS — I6523 Occlusion and stenosis of bilateral carotid arteries: Secondary | ICD-10-CM | POA: Diagnosis not present

## 2017-08-23 ENCOUNTER — Other Ambulatory Visit: Payer: Self-pay

## 2017-08-23 DIAGNOSIS — I6523 Occlusion and stenosis of bilateral carotid arteries: Secondary | ICD-10-CM

## 2017-08-26 ENCOUNTER — Encounter: Payer: Medicare HMO | Admitting: Surgery

## 2017-08-26 ENCOUNTER — Encounter (HOSPITAL_COMMUNITY): Payer: Medicare HMO

## 2017-08-27 ENCOUNTER — Other Ambulatory Visit (HOSPITAL_COMMUNITY): Payer: Self-pay | Admitting: Adult Health

## 2017-08-27 DIAGNOSIS — R1013 Epigastric pain: Secondary | ICD-10-CM

## 2017-08-28 NOTE — Telephone Encounter (Signed)
Error. She hasn't been seen since 09/2016.    Mike Craze, NP [place] [phone]

## 2017-08-28 NOTE — Telephone Encounter (Signed)
Notified patients husband, Leane Para, with understanding verbalized.

## 2017-08-28 NOTE — Telephone Encounter (Signed)
She does not have follow-up scheduled here.  Is she getting her care elsewhere?  She has not been seen since 12/2016. Therefore, I am declining to fill her prescriptions until she comes in for follow-up.   Mike Craze, NP [place] [phone]

## 2017-09-08 ENCOUNTER — Other Ambulatory Visit (HOSPITAL_COMMUNITY): Payer: Self-pay | Admitting: Oncology

## 2017-09-08 DIAGNOSIS — C50911 Malignant neoplasm of unspecified site of right female breast: Secondary | ICD-10-CM

## 2017-10-16 DIAGNOSIS — R7301 Impaired fasting glucose: Secondary | ICD-10-CM | POA: Diagnosis not present

## 2017-10-16 DIAGNOSIS — E782 Mixed hyperlipidemia: Secondary | ICD-10-CM | POA: Diagnosis not present

## 2017-10-25 DIAGNOSIS — H9193 Unspecified hearing loss, bilateral: Secondary | ICD-10-CM | POA: Diagnosis not present

## 2017-10-25 DIAGNOSIS — N182 Chronic kidney disease, stage 2 (mild): Secondary | ICD-10-CM | POA: Diagnosis not present

## 2017-10-25 DIAGNOSIS — N3281 Overactive bladder: Secondary | ICD-10-CM | POA: Diagnosis not present

## 2017-10-25 DIAGNOSIS — R109 Unspecified abdominal pain: Secondary | ICD-10-CM | POA: Diagnosis not present

## 2017-10-25 DIAGNOSIS — I10 Essential (primary) hypertension: Secondary | ICD-10-CM | POA: Diagnosis not present

## 2017-10-25 DIAGNOSIS — C50919 Malignant neoplasm of unspecified site of unspecified female breast: Secondary | ICD-10-CM | POA: Diagnosis not present

## 2017-10-25 DIAGNOSIS — F419 Anxiety disorder, unspecified: Secondary | ICD-10-CM | POA: Diagnosis not present

## 2017-10-25 DIAGNOSIS — F331 Major depressive disorder, recurrent, moderate: Secondary | ICD-10-CM | POA: Diagnosis not present

## 2017-10-25 DIAGNOSIS — E1122 Type 2 diabetes mellitus with diabetic chronic kidney disease: Secondary | ICD-10-CM | POA: Diagnosis not present

## 2017-10-28 ENCOUNTER — Ambulatory Visit (INDEPENDENT_AMBULATORY_CARE_PROVIDER_SITE_OTHER): Payer: Medicare HMO | Admitting: Surgery

## 2017-10-28 ENCOUNTER — Ambulatory Visit (HOSPITAL_COMMUNITY)
Admission: RE | Admit: 2017-10-28 | Discharge: 2017-10-28 | Disposition: A | Discharge status: Home / Self Care | Payer: Medicare HMO | Source: Ambulatory Visit | Attending: Surgery | Admitting: Surgery

## 2017-10-28 ENCOUNTER — Encounter

## 2017-10-28 ENCOUNTER — Other Ambulatory Visit: Payer: Self-pay

## 2017-10-28 ENCOUNTER — Encounter: Payer: Self-pay | Admitting: Surgery

## 2017-10-28 VITALS — BP 145/89 | HR 69 | Temp 97.1°F | Resp 20 | Ht 62.0 in | Wt 168.0 lb

## 2017-10-28 DIAGNOSIS — I6523 Occlusion and stenosis of bilateral carotid arteries: Secondary | ICD-10-CM

## 2017-10-28 NOTE — Progress Notes (Signed)
[facility] [place]  Patient name: Ashley Doyle MRN: [id number] DOB: June 19, 1945 Sex: female   REQUESTING PROVIDER:    Dr. [place] Crane   REASON FOR CONSULT:    carotid  HISTORY OF PRESENT ILLNESS:   Ashley Doyle is a 72 y.o. female, who is referred today for evaluation of her carotid disease.  The patient has a history of stroke secondary to hypertension approximately 10 years ago which have left her with some right-sided weakness and occasional swallowing difficulty and trouble talking.  She recently had an ultrasound which showed bilateral high-grade carotid lesions and a possible left carotid body tumor.  She denies any acute symptoms.  The patient has been diagnosed with diabetes in the past however she is not on medication and her A1c is 5.6 and she no longer carries that diagnosis.  She has a history of COPD secondary to significant tobacco abuse.  She is medically managed for hypertension which is under good control.  She takes a statin for hypercholesterolemia.  PAST MEDICAL HISTORY    Past Medical History:  Diagnosis Date  . Anxiety   . Arthritis   . Asthma    not on inhalers now- no flares recently  . Breast cancer ([facility])   . Chronic kidney disease    "as a child" "severe infection and it was toxic"  . Constipation   . COPD (chronic obstructive pulmonary disease) ([place])   . Depression   . Dyspnea    with exertion  . Emphysema of lung ([place])   . Family history of adverse reaction to anesthesia    sister has PONV  . GERD (gastroesophageal reflux disease)   . Headache    history of migraines- none since hysterectomy- has bad headaches at times  . HOH (hard of hearing)   . Hypertension   . Invasive ductal carcinoma of breast, female, right ([place]) 03/07/2016  . Peripheral vascular disease ([place])   . Pinched nerve in neck    and back  . Sciatica   . Stroke [facility])    2 - right sided weakness- right leg - "speech  expressive aphasia when nervous"  . Tuberculosis    "several years ago" 04/11/16  . Wears glasses   . Wears hearing aid    left ear     FAMILY HISTORY   Family History  Problem Relation Age of Onset  . Stroke Mother   . Heart disease Mother   . Lung cancer Father     SOCIAL HISTORY:   Social History   Socioeconomic History  . Marital status: Married    Spouse name: Not on file  . Number of children: Not on file  . Years of education: Not on file  . Highest education level: Not on file  Occupational History  . Not on file  Social Needs  . Financial resource strain: Not on file  . Food insecurity:    Worry: Not on file    Inability: Not on file  . Transportation needs:    Medical: Not on file    Non-medical: Not on file  Tobacco Use  . Smoking status: Current Every Day Smoker    Years: 54.00    Types: Cigarettes  . Smokeless tobacco: Never Used  . Tobacco comment: 33-35 per day  Substance and Sexual Activity  . Alcohol use: No  . Drug use: No  . Sexual activity: Not on file    Comment: hysterectomy  Lifestyle  . Physical activity:  Days per week: Not on file    Minutes per session: Not on file  . Stress: Not on file  Relationships  . Social connections:    Talks on phone: Not on file    Gets together: Not on file    Attends religious service: Not on file    Active member of club or organization: Not on file    Attends meetings of clubs or organizations: Not on file    Relationship status: Not on file  . Intimate partner violence:    Fear of current or ex partner: Not on file    Emotionally abused: Not on file    Physically abused: Not on file    Forced sexual activity: Not on file  Other Topics Concern  . Not on file  Social History Narrative  . Not on file    ALLERGIES:    Allergies  Allergen Reactions  . No Known Allergies     CURRENT MEDICATIONS:    Current Outpatient Medications  Medication Sig Dispense Refill  . buPROPion  (WELLBUTRIN XL) 150 MG 24 hr tablet Take 150 mg by mouth daily.    . citalopram (CELEXA) 40 MG tablet Take 40 mg by mouth daily.  4  . docusate sodium (COLACE) 100 MG capsule Take 100 mg by mouth daily as needed for mild constipation.    . gabapentin (NEURONTIN) 300 MG capsule Take 1 capsule (300 mg total) by mouth 3 (three) times daily. 90 capsule 2  . LORazepam (ATIVAN) 0.5 MG tablet Take 1 tablet (0.5 mg total) by mouth every 8 (eight) hours. (Patient taking differently: Take 0.5 mg by mouth every 8 (eight) hours as needed for anxiety. ) 30 tablet 0  . methocarbamol (ROBAXIN) 750 MG tablet Take 1 tablet (750 mg total) by mouth every 8 (eight) hours as needed (use for muscle cramps/pain). 20 tablet 0  . Mirabegron (MYRBETRIQ PO) Take by mouth.    Marland Kitchen omeprazole (PRILOSEC) 20 MG capsule TAKE 1 CAPSULE BY MOUTH EVERY DAY 30 capsule 0  . oxyCODONE (OXY IR/ROXICODONE) 5 MG immediate release tablet Take 1 tablet (5 mg total) by mouth every 6 (six) hours as needed for moderate pain, severe pain or breakthrough pain. 20 tablet 0  . triamterene-hydrochlorothiazide (MAXZIDE-25) 37.5-25 MG tablet Take 1 tablet by mouth daily.  1   No current facility-administered medications for this visit.     REVIEW OF SYSTEMS:   [X]  denotes positive finding, [ ]  denotes negative finding Cardiac  Comments:  Chest pain or chest pressure:    Shortness of breath upon exertion: x   Short of breath when lying flat: x   Irregular heart rhythm: x       Vascular    Pain in calf, thigh, or hip brought on by ambulation: x   Pain in feet at night that wakes you up from your sleep:  x   Blood clot in your veins:    Leg swelling:         Pulmonary    Oxygen at home:    Productive cough:     Wheezing:  x       Neurologic    Sudden weakness in arms or legs:  x   Sudden numbness in arms or legs:     Sudden onset of difficulty speaking or slurred speech:    Temporary loss of vision in one eye:     Problems with  dizziness:  x       Gastrointestinal  Blood in stool:      Vomited blood:         Genitourinary    Burning when urinating:     Blood in urine: x       Psychiatric    Major depression:         Hematologic    Bleeding problems:    Problems with blood clotting too easily:        Skin    Rashes or ulcers:        Constitutional    Fever or chills:     PHYSICAL EXAM:   Vitals:   10/28/17 1437 10/28/17 1438  BP: 135/82 (!) 145/89  Pulse: 69   Resp: 20   Temp: (!) 97.1 F (36.2 C)   TempSrc: Oral   SpO2: 97%   Weight: 168 lb (76.2 kg)   Height: 5\' 2"  (1.575 m)     GENERAL: The patient is a well-nourished female, in no acute distress. The vital signs are documented above. CARDIAC: There is a regular rate and rhythm.  VASCULAR: No carotid bruits PULMONARY: Nonlabored respirations ABDOMEN: Soft and non-tender with normal pitched bowel sounds.  MUSCULOSKELETAL: There are no major deformities or cyanosis. NEUROLOGIC: No focal weakness or paresthesias are detected. SKIN: There are no ulcers or rashes noted. PSYCHIATRIC: The patient has a normal affect.  STUDIES:   I have reviewed her carotid Doppler studies which show greater 80-99% right carotid stenosis.  There is a 60 to 79% left carotid stenosis.  There is a mass lesion at the carotid bifurcation.  The artery is not normal past the stenosis on the right  ASSESSMENT and PLAN   Carotid stenosis: I discussed with the patient that she has a high-grade right carotid lesion that I would consider intervening on however the bifurcation is high and the artery is not normal past the stenosis.  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  This will be better defined on CT scan.  The patient will follow-up with  me in 2-4 weeks with a CT Joetta Manners of the neck to determine the next course of action.   Annamarie Major, MD [facility] [phone] Pager [phone]

## 2017-10-29 ENCOUNTER — Other Ambulatory Visit: Payer: Self-pay

## 2017-10-29 DIAGNOSIS — I6523 Occlusion and stenosis of bilateral carotid arteries: Secondary | ICD-10-CM

## 2017-11-01 ENCOUNTER — Encounter: Payer: Self-pay | Admitting: Internal Medicine

## 2017-11-13 ENCOUNTER — Ambulatory Visit
Admission: RE | Admit: 2017-11-13 | Discharge: 2017-11-13 | Disposition: A | Discharge status: Home / Self Care | Payer: Medicare HMO | Source: Ambulatory Visit | Attending: Surgery | Admitting: Surgery

## 2017-11-13 DIAGNOSIS — I6523 Occlusion and stenosis of bilateral carotid arteries: Secondary | ICD-10-CM

## 2017-11-13 MED ORDER — IOPAMIDOL (ISOVUE-370) INJECTION 76%
75.0000 mL | Freq: Once | INTRAVENOUS | Status: AC | PRN
  Administered 2017-11-13: 75 mL via INTRAVENOUS

## 2017-11-18 ENCOUNTER — Ambulatory Visit (INDEPENDENT_AMBULATORY_CARE_PROVIDER_SITE_OTHER): Payer: Medicare HMO | Admitting: Surgery

## 2017-11-18 ENCOUNTER — Ambulatory Visit (INDEPENDENT_AMBULATORY_CARE_PROVIDER_SITE_OTHER): Payer: Medicare HMO | Admitting: Cardiovascular Disease

## 2017-11-18 ENCOUNTER — Encounter: Payer: Self-pay | Admitting: Surgery

## 2017-11-18 ENCOUNTER — Encounter: Payer: Self-pay | Admitting: *Deleted

## 2017-11-18 ENCOUNTER — Encounter: Payer: Self-pay | Admitting: Cardiovascular Disease

## 2017-11-18 ENCOUNTER — Other Ambulatory Visit: Payer: Self-pay

## 2017-11-18 VITALS — BP 132/70 | HR 76 | Ht 62.0 in | Wt 168.8 lb

## 2017-11-18 VITALS — BP 153/89 | HR 77 | Resp 18 | Ht 62.0 in | Wt 167.0 lb

## 2017-11-18 DIAGNOSIS — R0602 Shortness of breath: Secondary | ICD-10-CM

## 2017-11-18 DIAGNOSIS — I6523 Occlusion and stenosis of bilateral carotid arteries: Secondary | ICD-10-CM

## 2017-11-18 DIAGNOSIS — R079 Chest pain, unspecified: Secondary | ICD-10-CM

## 2017-11-18 DIAGNOSIS — I10 Essential (primary) hypertension: Secondary | ICD-10-CM

## 2017-11-18 DIAGNOSIS — Z8673 Personal history of transient ischemic attack (TIA), and cerebral infarction without residual deficits: Secondary | ICD-10-CM

## 2017-11-18 DIAGNOSIS — D446 Neoplasm of uncertain behavior of carotid body: Secondary | ICD-10-CM | POA: Diagnosis not present

## 2017-11-18 DIAGNOSIS — Z720 Tobacco use: Secondary | ICD-10-CM

## 2017-11-18 DIAGNOSIS — E785 Hyperlipidemia, unspecified: Secondary | ICD-10-CM

## 2017-11-18 DIAGNOSIS — Z01818 Encounter for other preprocedural examination: Secondary | ICD-10-CM | POA: Diagnosis not present

## 2017-11-18 MED ORDER — ASPIRIN EC 81 MG PO TBEC: 81.0000 mg | DELAYED_RELEASE_TABLET | Freq: Every day | ORAL | 3 refills | Status: AC

## 2017-11-18 MED ORDER — ATORVASTATIN CALCIUM 40 MG PO TABS: 40.0000 mg | ORAL_TABLET | Freq: Every day | ORAL | 3 refills | Status: DC

## 2017-11-18 NOTE — Patient Instructions (Signed)
Medication Instructions:  Stop Pravastatin  Start Lipitor 40 mg Daily  Start Aspirin 81 mg Daily   Labwork: None   Testing/Procedures: Your physician has requested that you have a lexiscan myoview. For further information please visit [email or web address]. Please follow instruction sheet, as given.    Follow-Up: Your physician recommends that you schedule a follow-up appointment in: 6 Weeks    Any Other Special Instructions Will Be Listed Below (If Applicable).     If you need a refill on your cardiac medications before your next appointment, please call your pharmacy.  Thank you for choosing [facility]!

## 2017-11-18 NOTE — Progress Notes (Addendum)
CARDIOLOGY CONSULT NOTE  Ashley Doyle MRN: [id number] DOB/AGE: 11/24/1945 72 y.o.  Admit date: (Not on file) Primary Physician: Celene Squibb, MD Referring Physician: Dr. Harold Barban  Reason for Consultation: Preoperative risk stratification  HPI: Ashley Doyle is a 72 y.o. female who is being seen today for the evaluation of preoperative risk stratification at the request of Serafina Mitchell, MD.   She has a history of carotid disease and left carotid body tumor.  She also has a history of stroke and hypertension with residual right-sided weakness and some dysphasia and dysarthria.  She has a history of COPD, right-sided invasive ductal carcinoma of the breast, hypercholesterolemia, and tobacco abuse.  I reviewed an echocardiogram performed on 06/14/2016 which demonstrated normal left ventricular systolic function and regional wall motion, LVEF 60 to 65%.  There was mild right atrial dilatation.  CT scan showed 75% proximal right internal carotid artery stenosis and 70% proximal left internal carotid artery stenosis.  There was a 2.3 cm hypervascular mass at the left carotid bifurcation consistent with a carotid body tumor.  There was moderate bilateral vertebral artery origin stenosis.  Vascular surgery evaluated the Ashley most recently today and he along with the Ashley and her husband have decided to proceed with removal of her left carotid body tumor and simultaneous left carotid endarterectomy.  I reviewed labs dated 06/24/2017: IMA globin 14.5, platelets 190, BUN 15, creatinine 1.34, total cholesterol 202, triglycerides 150, HDL 50, LDL 122.  She has been experiencing retrosternal chest pain which occurs after she has had an active day.  It radiates to both shoulders and down both arms.  She has associated shortness of breath, lightheadedness, dizziness, and nausea.  She denies palpitations and syncope.  She smokes anywhere from 33-35 cigarettes daily but  is trying to cut back down.  She said she forgets to take aspirin on most days.  She was recently prescribed pravastatin 10 mg.  She has felt more fatigued over the past several months.  She staggers when she walks.  She also complains of right infra axillary pain from nerve damage after mastectomy.  I ordered and personally reviewed the ECG performed today which demonstrates sinus rhythm with mild nonspecific and diffuse T wave abnormalities.   Allergies  Allergen Reactions  . No Known Allergies     Current Outpatient Medications  Medication Sig Dispense Refill  . buPROPion (WELLBUTRIN XL) 150 MG 24 hr tablet Take 150 mg by mouth daily.    . citalopram (CELEXA) 40 MG tablet Take 40 mg by mouth daily.  4  . docusate sodium (COLACE) 100 MG capsule Take 100 mg by mouth daily as needed for mild constipation.    . gabapentin (NEURONTIN) 300 MG capsule Take 1 capsule (300 mg total) by mouth 3 (three) times daily. 90 capsule 2  . LORazepam (ATIVAN) 0.5 MG tablet Take 1 tablet (0.5 mg total) by mouth every 8 (eight) hours. (Ashley taking differently: Take 0.5 mg by mouth every 8 (eight) hours as needed for anxiety. ) 30 tablet 0  . methocarbamol (ROBAXIN) 750 MG tablet Take 1 tablet (750 mg total) by mouth every 8 (eight) hours as needed (use for muscle cramps/pain). 20 tablet 0  . Mirabegron (MYRBETRIQ PO) Take by mouth.    Marland Kitchen omeprazole (PRILOSEC) 20 MG capsule TAKE 1 CAPSULE BY MOUTH EVERY DAY 30 capsule 0  . oxyCODONE (OXY IR/ROXICODONE) 5 MG immediate release tablet Take 1 tablet (5  mg total) by mouth every 6 (six) hours as needed for moderate pain, severe pain or breakthrough pain. 20 tablet 0  . triamterene-hydrochlorothiazide (MAXZIDE-25) 37.5-25 MG tablet Take 1 tablet by mouth daily.  1   No current facility-administered medications for this visit.     Past Medical History:  Diagnosis Date  . Anxiety   . Arthritis   . Asthma    not on inhalers now- no flares recently  . Breast  cancer ([place])   . Chronic kidney disease    "as a child" "severe infection and it was toxic"  . Constipation   . COPD (chronic obstructive pulmonary disease) ([place])   . Depression   . Dyspnea    with exertion  . Emphysema of lung ([place])   . Family history of adverse reaction to anesthesia    sister has PONV  . GERD (gastroesophageal reflux disease)   . Headache    history of migraines- none since hysterectomy- has bad headaches at times  . HOH (hard of hearing)   . Hypertension   . Invasive ductal carcinoma of breast, female, right ([place]) 03/07/2016  . Peripheral vascular disease ([place])   . Pinched nerve in neck    and back  . Sciatica   . Stroke [facility])    2 - right sided weakness- right leg - "speech expressive aphasia when nervous"  . Tuberculosis    "several years ago" 04/11/16  . Wears glasses   . Wears hearing aid    left ear    Past Surgical History:  Procedure Laterality Date  . ABDOMINAL HYSTERECTOMY    . APPENDECTOMY    . Arm surgery Right    injury- cut with glass  . INNER EAR SURGERY Right   . MASTECTOMY WITH RADIOACTIVE SEED GUIDED EXCISION AND AXILLARY SENTINEL LYMPH NODE BIOPSY Right 11/22/2016   Procedure: REMOVAL OF RIGHT BREAST RADIOACTIVE SEED, RIGHT TOTAL MASTECTOMY WITH RADIOACTIVE SEED TARGETED RIGHT AXILLARY LYMPH NODE EXCISION AND AXILLARY SENTINEL LYMPH NODE BIOPSY, POSSIBLE RIGHT AXILLARY DISSECTION;  Surgeon: Rolm Bookbinder, MD;  Location: [place];  Service: General;  Laterality: Right;  . PORT-A-CATH REMOVAL Left 11/22/2016   Procedure: REMOVAL PORT-A-CATH;  Surgeon: Rolm Bookbinder, MD;  Location: [place];  Service: General;  Laterality: Left;  . PORTACATH PLACEMENT N/A 04/12/2016   Procedure: INSERTION PORT-A-CATH WITH [place];  Surgeon: Rolm Bookbinder, MD;  Location: [facility] OR;  Service: General;  Laterality: N/A;    Social History   Socioeconomic History  . Marital status: Married    Spouse name: Not on file  . Number of children: Not on file  .  Years of education: Not on file  . Highest education level: Not on file  Occupational History  . Not on file  Social Needs  . Financial resource strain: Not on file  . Food insecurity:    Worry: Not on file    Inability: Not on file  . Transportation needs:    Medical: Not on file    Non-medical: Not on file  Tobacco Use  . Smoking status: Current Every Day Smoker    Packs/day: 1.50    Years: 54.00    Pack years: 81.00    Types: Cigarettes  . Smokeless tobacco: Never Used  . Tobacco comment: 33-35 per day  Substance and Sexual Activity  . Alcohol use: No  . Drug use: No  . Sexual activity: Not on file    Comment: hysterectomy  Lifestyle  . Physical activity:    Days  per week: Not on file    Minutes per session: Not on file  . Stress: Not on file  Relationships  . Social connections:    Talks on phone: Not on file    Gets together: Not on file    Attends religious service: Not on file    Active member of club or organization: Not on file    Attends meetings of clubs or organizations: Not on file    Relationship status: Not on file  . Intimate partner violence:    Fear of current or ex partner: Not on file    Emotionally abused: Not on file    Physically abused: Not on file    Forced sexual activity: Not on file  Other Topics Concern  . Not on file  Social History Narrative  . Not on file     No family history of premature CAD in 1st degree relatives.  Current Meds  Medication Sig  . buPROPion (WELLBUTRIN XL) 150 MG 24 hr tablet Take 150 mg by mouth daily.  . citalopram (CELEXA) 40 MG tablet Take 40 mg by mouth daily.  Marland Kitchen docusate sodium (COLACE) 100 MG capsule Take 100 mg by mouth daily as needed for mild constipation.  . gabapentin (NEURONTIN) 300 MG capsule Take 1 capsule (300 mg total) by mouth 3 (three) times daily.  Marland Kitchen LORazepam (ATIVAN) 0.5 MG tablet Take 1 tablet (0.5 mg total) by mouth every 8 (eight) hours. (Ashley taking differently: Take 0.5 mg by  mouth every 8 (eight) hours as needed for anxiety. )  . methocarbamol (ROBAXIN) 750 MG tablet Take 1 tablet (750 mg total) by mouth every 8 (eight) hours as needed (use for muscle cramps/pain).  . Mirabegron (MYRBETRIQ PO) Take by mouth.  Marland Kitchen omeprazole (PRILOSEC) 20 MG capsule TAKE 1 CAPSULE BY MOUTH EVERY DAY  . oxyCODONE (OXY IR/ROXICODONE) 5 MG immediate release tablet Take 1 tablet (5 mg total) by mouth every 6 (six) hours as needed for moderate pain, severe pain or breakthrough pain.  Marland Kitchen triamterene-hydrochlorothiazide (MAXZIDE-25) 37.5-25 MG tablet Take 1 tablet by mouth daily.      Review of systems complete and found to be negative unless listed above in HPI    Physical exam Blood pressure 132/70, pulse 76, height 5\' 2"  (1.575 m), weight 168 lb 12.8 oz (76.6 kg), SpO2 98 %. General: NAD Neck: No JVD, no thyromegaly or thyroid nodule.  Lungs: Clear to auscultation bilaterally with normal respiratory effort. CV: Nondisplaced PMI. Regular rate and rhythm, normal S1/S2, no S3/S4, no murmur.  No peripheral edema.  No carotid bruit.  Abdomen: Soft, nontender, no distention.  Skin: Intact without lesions or rashes.  Neurologic: Alert and oriented x 3.  Psych: Normal affect. Extremities: No clubbing or cyanosis.  HEENT: Hearing aid in left ear.  ECG: Most recent ECG reviewed.   Labs: Lab Results  Component Value Date/Time   K 4.5 11/23/2016 04:43 AM   BUN 11 11/23/2016 04:43 AM   CREATININE 0.93 11/23/2016 04:43 AM   ALT 21 10/17/2016 10:53 AM   HGB 13.4 11/19/2016 10:07 AM     Lipids: No results found for: LDLCALC, LDLDIRECT, CHOL, TRIG, HDL      ASSESSMENT AND PLAN:  1.  Preoperative risk stratification/chest pain and shortness of breath: She has several cardiovascular risk factors as noted above.  She complains of chest pain, exertional dyspnea, and progressive fatigue.  I will proceed with a nuclear myocardial perfusion imaging study to evaluate for ischemic heart  disease [facility]) which will help to provide more accurate stratification.  Given her history of stroke, I have recommended she start aspirin 81 mg daily as well.  I am also switching pravastatin 10 mg to Lipitor 40 mg given her carotid artery disease and history of stroke as she requires more potent statin therapy.  2.  Hypertension: Blood pressure is normal today.  No changes to therapy.  3.  Bilateral carotid artery stenosis and left carotid body tumor: CT scan reviewed above.  She saw vascular surgery today. I have recommended she start aspirin 81 mg daily.  I am also switching pravastatin 10 mg to Lipitor 40 mg given her carotid artery disease and history of stroke as she requires more potent statin therapy.  4.  History of stroke: She forgets to take aspirin most days and was recently started on pravastatin 10 mg.  Given her history of carotid artery disease as well, I told her to start taking aspirin 81 mg daily.  I will switch pravastatin to a more potent statin, Lipitor 40 mg daily.   5.  Hypercholesterolemia: Lipids reviewed above.  6.  Tobacco abuse: She smokes 33-35 cigarettes daily but is trying to cut back.   Disposition: Follow up in 6 weeks  Signed: Kate Sable, M.D., F.A.C.C.  11/18/2017, 2:23 PM

## 2017-11-18 NOTE — Progress Notes (Signed)
[facility] [place]  Patient name: Ashley Doyle MRN: [id number] DOB: December 04, 1945 Sex: female   REASON FOR VISIT:    Follow up  HISOTRY OF PRESENT ILLNESS:    Ashley Doyle is a 72 y.o. female, who returns for follow up of her carotid disease and left carotid body tumor.  The patient has a history of stroke secondary to hypertension approximately 10 years ago which have left her with some right-sided weakness and occasional swallowing difficulty and trouble talking.  She recently had an ultrasound which showed bilateral high-grade carotid lesions and a possible left carotid body tumor.  She denies any acute symptoms.  The patient has been diagnosed with diabetes in the past however she is not on medication and her A1c is 5.6 and she no longer carries that diagnosis.  She has a history of COPD secondary to significant tobacco abuse.  She is medically managed for hypertension which is under good control.  She takes a statin for hypercholesterolemia.  I sent her for a CT scan.  SHe is here to review the results   PAST MEDICAL HISTORY:   Past Medical History:  Diagnosis Date  . Anxiety   . Arthritis   . Asthma    not on inhalers now- no flares recently  . Breast cancer ([place])   . Chronic kidney disease    "as a child" "severe infection and it was toxic"  . Constipation   . COPD (chronic obstructive pulmonary disease) ([place])   . Depression   . Dyspnea    with exertion  . Emphysema of lung ([place])   . Family history of adverse reaction to anesthesia    sister has PONV  . GERD (gastroesophageal reflux disease)   . Headache    history of migraines- none since hysterectomy- has bad headaches at times  . HOH (hard of hearing)   . Hypertension   . Invasive ductal carcinoma of breast, female, right ([place]) 03/07/2016  . Peripheral vascular disease ([place])   . Pinched nerve in neck    and back  . Sciatica   . Stroke [facility])    2 - right sided weakness- right leg - "speech expressive aphasia when nervous"  . Tuberculosis    "several years ago" 04/11/16  . Wears glasses   . Wears hearing aid    left ear     FAMILY HISTORY:   Family History  Problem Relation Age of Onset  . Stroke Mother   . Heart disease Mother   . Lung cancer Father     SOCIAL HISTORY:   Social History   Tobacco Use  . Smoking status: Current Every Day Smoker    Years: 54.00    Types: Cigarettes  . Smokeless tobacco: Never Used  . Tobacco comment: 33-35 per day  Substance Use Topics  . Alcohol use: No     ALLERGIES:   Allergies  Allergen Reactions  . No Known Allergies      CURRENT MEDICATIONS:   Current Outpatient Medications  Medication Sig Dispense Refill  . buPROPion (WELLBUTRIN XL) 150 MG 24 hr tablet Take 150 mg by mouth daily.    . citalopram (CELEXA) 40 MG tablet Take 40 mg by mouth daily.  4  . docusate sodium (COLACE) 100 MG capsule Take 100 mg by mouth daily as needed for mild constipation.    . gabapentin (NEURONTIN) 300 MG capsule Take 1 capsule (300 mg total) by mouth 3 (three) times daily. [place]  capsule 2  . LORazepam (ATIVAN) 0.5 MG tablet Take 1 tablet (0.5 mg total) by mouth every 8 (eight) hours. (Patient taking differently: Take 0.5 mg by mouth every 8 (eight) hours as needed for anxiety. ) 30 tablet 0  . methocarbamol (ROBAXIN) 750 MG tablet Take 1 tablet (750 mg total) by mouth every 8 (eight) hours as needed (use for muscle cramps/pain). 20 tablet 0  . Mirabegron (MYRBETRIQ PO) Take by mouth.    Marland Kitchen omeprazole (PRILOSEC) 20 MG capsule TAKE 1 CAPSULE BY MOUTH EVERY DAY 30 capsule 0  . oxyCODONE (OXY IR/ROXICODONE) 5 MG immediate release tablet Take 1 tablet (5 mg total) by mouth every 6 (six) hours as needed for moderate pain, severe pain or breakthrough pain. 20 tablet 0  . triamterene-hydrochlorothiazide (MAXZIDE-25) 37.5-25 MG tablet Take 1 tablet by mouth daily.  1   No current  facility-administered medications for this visit.     REVIEW OF SYSTEMS:   [X]  denotes positive finding, [ ]  denotes negative finding Cardiac  Comments:  Chest pain or chest pressure:    Shortness of breath upon exertion:    Short of breath when lying flat:    Irregular heart rhythm:        Vascular    Pain in calf, thigh, or hip brought on by ambulation:    Pain in feet at night that wakes you up from your sleep:     Blood clot in your veins:    Leg swelling:         Pulmonary    Oxygen at home:    Productive cough:     Wheezing:         Neurologic    Sudden weakness in arms or legs:     Sudden numbness in arms or legs:     Sudden onset of difficulty speaking or slurred speech:    Temporary loss of vision in one eye:     Problems with dizziness:         Gastrointestinal    Blood in stool:     Vomited blood:         Genitourinary    Burning when urinating:     Blood in urine:        Psychiatric    Major depression:         Hematologic    Bleeding problems:    Problems with blood clotting too easily:        Skin    Rashes or ulcers:        Constitutional    Fever or chills:      PHYSICAL EXAM:   Vitals:   11/18/17 0831 11/18/17 0833  BP: (!) 154/76 (!) 153/89  Pulse: 77   Resp: 18   SpO2: 98%   Weight: 167 lb (75.8 kg)   Height: 5\' 2"  (1.575 m)     GENERAL: The patient is a well-nourished female, in no acute distress. The vital signs are documented above. CARDIAC: There is a regular rate and rhythm.  PULMONARY: Non-labored respirations  MUSCULOSKELETAL: There are no major deformities or cyanosis. NEUROLOGIC: No focal weakness or paresthesias are detected. SKIN: There are no ulcers or rashes noted. PSYCHIATRIC: The patient has a normal affect.  STUDIES:   I have ordered and reviewed her CT scan with the following results: 1. 75% proximal right ICA stenosis. 2. 70% proximal left ICA stenosis. 3. 2.3 cm hypervascular mass at the left carotid  bifurcation consistent with a carotid  body tumor. 4. Moderate bilateral vertebral artery origin stenosis. 5. Aortic Atherosclerosis (ICD10-I70.0) and Emphysema (ICD10-J43.9).   MEDICAL ISSUES:   Carotid disease: The patient is unsure whether or not she is having recurrent symptoms.  She says she will occasionally have short episodes of slurred speech.  She will also get some pain in her right jaw which she states is similar to when she had her stroke many years ago.  I reviewed the patient's CT scan with her husband and the patient.  We have decided to proceed with removal of her left carotid body tumor and simultaneous left carotid endarterectomy.  I discussed the risk benefits the operation including the risk of stroke and nerve injury.  I will have her get embolization of her carotid body tumor the day prior to her surgery.  All the questions were answered.  This will be scheduled for August.  We will consider elective right carotid endarterectomy when she recovers from the left side.  I will get cardiology clearance.    Annamarie Major, MD [facility] [phone] Pager [phone]

## 2017-11-19 ENCOUNTER — Other Ambulatory Visit: Payer: Self-pay | Admitting: Surgery

## 2017-11-19 ENCOUNTER — Other Ambulatory Visit (HOSPITAL_COMMUNITY): Payer: Self-pay | Admitting: Interventional Radiology

## 2017-11-19 ENCOUNTER — Other Ambulatory Visit: Payer: Self-pay | Admitting: *Deleted

## 2017-11-19 DIAGNOSIS — D446 Neoplasm of uncertain behavior of carotid body: Secondary | ICD-10-CM

## 2017-11-20 ENCOUNTER — Other Ambulatory Visit: Payer: Self-pay

## 2017-11-20 ENCOUNTER — Telehealth: Payer: Self-pay | Admitting: *Deleted

## 2017-11-20 DIAGNOSIS — Z01818 Encounter for other preprocedural examination: Secondary | ICD-10-CM

## 2017-11-20 NOTE — Telephone Encounter (Signed)
Spoke to patient and spouse about surgery for 7/22-7/23. Reminded them first procedure will be with Dr.  Estanislado Pandy and they will get information from that office regarding time and place to be/pre-procedure instructions. Informed them patient will spend the night to have procedure with Dr. Trula Slade the following morning. Expect a call from the hospital pre-admission department and follow the detailed pre-op instructions about this surgery. Verbalized understanding.

## 2017-11-21 ENCOUNTER — Encounter (HOSPITAL_COMMUNITY): Payer: Medicare HMO

## 2017-11-25 ENCOUNTER — Telehealth: Payer: Self-pay | Admitting: *Deleted

## 2017-11-25 NOTE — Telephone Encounter (Signed)
-----   Message from Willy Eddy, RN sent at 11/25/2017  5:02 PM EDT ----- Regarding: FW: Lexiscan and cardiac clearance   ----- Message ----- From: Willy Eddy, RN Sent: 11/22/2017  10:44 AM To: Drema Dallas, CMA Subject: Carlton Adam and cardiac clearance                 Veva Holes, thank you all for getting this patient in so quickly!! I am unable to see the Lexiscan result when one is done at [facility]. I am not sure why this is. Could you please fax the final results/cardiac clearance to this office? [phone] I can see Dr. Kristian Covey notes. I really appreciate your help. Becky RN

## 2017-11-29 ENCOUNTER — Telehealth: Payer: Self-pay | Admitting: *Deleted

## 2017-11-29 NOTE — Telephone Encounter (Signed)
Spoke with patient re: cancelled Lexiscan. She stated the "Lexiscan was too long, would be too stressful and get her blood pressure up. If there is something wrong with my heart I am not having any heart surgery. I was told my heart was strong." Informed her the decision is completely up to her, however; Dr. Trula Slade could not proceed with scheduled surgery until she is cleared by Cardiologist. She verbalized understanding and stated she would call Dr. Karena Addison office today. Phone number given to patient.

## 2017-12-05 ENCOUNTER — Encounter (HOSPITAL_BASED_OUTPATIENT_CLINIC_OR_DEPARTMENT_OTHER)
Admission: RE | Admit: 2017-12-05 | Discharge: 2017-12-05 | Disposition: A | Discharge status: Home / Self Care | Payer: Medicare HMO | Source: Ambulatory Visit | Attending: Cardiovascular Disease | Admitting: Cardiovascular Disease

## 2017-12-05 ENCOUNTER — Encounter (HOSPITAL_COMMUNITY)
Admission: RE | Admit: 2017-12-05 | Discharge: 2017-12-05 | Disposition: A | Discharge status: Home / Self Care | Payer: Medicare HMO | Source: Ambulatory Visit | Attending: Cardiovascular Disease | Admitting: Cardiovascular Disease

## 2017-12-05 ENCOUNTER — Encounter (HOSPITAL_COMMUNITY): Payer: Self-pay

## 2017-12-05 DIAGNOSIS — I10 Essential (primary) hypertension: Secondary | ICD-10-CM | POA: Insufficient documentation

## 2017-12-05 DIAGNOSIS — R079 Chest pain, unspecified: Secondary | ICD-10-CM

## 2017-12-05 DIAGNOSIS — F17210 Nicotine dependence, cigarettes, uncomplicated: Secondary | ICD-10-CM | POA: Insufficient documentation

## 2017-12-05 DIAGNOSIS — Z79899 Other long term (current) drug therapy: Secondary | ICD-10-CM | POA: Insufficient documentation

## 2017-12-05 DIAGNOSIS — Z01818 Encounter for other preprocedural examination: Secondary | ICD-10-CM

## 2017-12-05 DIAGNOSIS — I69322 Dysarthria following cerebral infarction: Secondary | ICD-10-CM | POA: Insufficient documentation

## 2017-12-05 DIAGNOSIS — R0602 Shortness of breath: Secondary | ICD-10-CM

## 2017-12-05 DIAGNOSIS — I69351 Hemiplegia and hemiparesis following cerebral infarction affecting right dominant side: Secondary | ICD-10-CM | POA: Insufficient documentation

## 2017-12-05 DIAGNOSIS — J449 Chronic obstructive pulmonary disease, unspecified: Secondary | ICD-10-CM | POA: Insufficient documentation

## 2017-12-05 DIAGNOSIS — Z853 Personal history of malignant neoplasm of breast: Secondary | ICD-10-CM | POA: Insufficient documentation

## 2017-12-05 DIAGNOSIS — I6523 Occlusion and stenosis of bilateral carotid arteries: Secondary | ICD-10-CM | POA: Insufficient documentation

## 2017-12-05 DIAGNOSIS — I69321 Dysphasia following cerebral infarction: Secondary | ICD-10-CM | POA: Insufficient documentation

## 2017-12-05 LAB — NM MYOCAR MULTI W/SPECT W/WALL MOTION / EF
CHL CUP NUCLEAR SSS: 12
LHR: 0.53
LVDIAVOL: 83 mL (ref 46–106)
LVSYSVOL: 32 mL
Peak HR: 86 {beats}/min
Rest HR: 69 {beats}/min
SDS: 6
SRS: 6
TID: 1.14

## 2017-12-05 MED ORDER — SODIUM CHLORIDE 0.9% FLUSH
INTRAVENOUS | Status: AC
  Administered 2017-12-05: 10 mL via INTRAVENOUS
  Filled 2017-12-05: qty 10

## 2017-12-05 MED ORDER — REGADENOSON 0.4 MG/5ML IV SOLN
INTRAVENOUS | Status: AC
  Administered 2017-12-05: 0.4 mg via INTRAVENOUS
  Filled 2017-12-05: qty 5

## 2017-12-05 MED ORDER — TECHNETIUM TC 99M TETROFOSMIN IV KIT
30.0000 | PACK | Freq: Once | INTRAVENOUS | Status: AC | PRN
Start: 2017-12-05 — End: 2017-12-05
  Administered 2017-12-05: 31 via INTRAVENOUS

## 2017-12-05 MED ORDER — TECHNETIUM TC 99M TETROFOSMIN IV KIT
10.0000 | PACK | Freq: Once | INTRAVENOUS | Status: AC | PRN
  Administered 2017-12-05: 10.6 via INTRAVENOUS

## 2017-12-06 ENCOUNTER — Telehealth: Payer: Self-pay | Admitting: *Deleted

## 2017-12-06 NOTE — Telephone Encounter (Signed)
Patient informed and verbalized understanding of plan.  Will await response from Dr. Raliegh Ip.

## 2017-12-06 NOTE — Telephone Encounter (Signed)
-----   Message from Arnoldo Lenis, MD sent at 12/06/2017  1:43 PM EDT ----- Stress tests shows some mild to moderate blockages, overall considered intermediate risk. Will defer to Dr Bronson Ing who will be back early next week regarding the implications for her surgery   J BrancH MD

## 2017-12-10 ENCOUNTER — Telehealth: Payer: Self-pay | Admitting: *Deleted

## 2017-12-10 NOTE — Telephone Encounter (Signed)
-----   Message from Herminio Commons, MD sent at 12/10/2017  1:22 PM EDT ----- She is at increased risk for a cardiac event in the perioperative period.  Start metoprolol 25 mg twice daily.  This will help reduce perioperative risk.

## 2017-12-11 ENCOUNTER — Other Ambulatory Visit: Payer: Self-pay

## 2017-12-11 MED ORDER — METOPROLOL TARTRATE 25 MG PO TABS: 25.0000 mg | ORAL_TABLET | Freq: Two times a day (BID) | ORAL | 3 refills | Status: DC

## 2017-12-11 NOTE — Telephone Encounter (Signed)
Patient informed and verbalized understanding of plan. rx sent to pharmacy.

## 2017-12-12 ENCOUNTER — Telehealth: Payer: Self-pay | Admitting: Cardiovascular Disease

## 2017-12-12 MED ORDER — METOPROLOL TARTRATE 25 MG PO TABS: 25.0000 mg | ORAL_TABLET | Freq: Two times a day (BID) | ORAL | 3 refills | Status: DC

## 2017-12-12 NOTE — Telephone Encounter (Signed)
Pt's husband LVM stating the pharmacy hasn't received her new medication yet. He didn't say what medication it was

## 2017-12-12 NOTE — Telephone Encounter (Signed)
Refill sent to [facility].  

## 2017-12-24 NOTE — Progress Notes (Signed)
IB message sent to Ashley Doyle for pre-admission orders.

## 2017-12-25 ENCOUNTER — Other Ambulatory Visit: Payer: Self-pay | Admitting: Student

## 2017-12-25 ENCOUNTER — Other Ambulatory Visit: Payer: Self-pay

## 2017-12-25 ENCOUNTER — Encounter (HOSPITAL_COMMUNITY)
Admission: RE | Admit: 2017-12-25 | Discharge: 2017-12-25 | Disposition: A | Discharge status: Home / Self Care | Payer: Medicare HMO | Source: Ambulatory Visit | Attending: Interventional Radiology | Admitting: Interventional Radiology

## 2017-12-25 ENCOUNTER — Other Ambulatory Visit: Payer: Self-pay | Admitting: Radiology

## 2017-12-25 ENCOUNTER — Encounter (HOSPITAL_COMMUNITY): Payer: Self-pay

## 2017-12-25 DIAGNOSIS — F329 Major depressive disorder, single episode, unspecified: Secondary | ICD-10-CM | POA: Insufficient documentation

## 2017-12-25 DIAGNOSIS — Z853 Personal history of malignant neoplasm of breast: Secondary | ICD-10-CM | POA: Diagnosis not present

## 2017-12-25 DIAGNOSIS — Z01818 Encounter for other preprocedural examination: Secondary | ICD-10-CM | POA: Diagnosis not present

## 2017-12-25 DIAGNOSIS — Z7982 Long term (current) use of aspirin: Secondary | ICD-10-CM | POA: Diagnosis not present

## 2017-12-25 DIAGNOSIS — Z79899 Other long term (current) drug therapy: Secondary | ICD-10-CM | POA: Insufficient documentation

## 2017-12-25 DIAGNOSIS — J449 Chronic obstructive pulmonary disease, unspecified: Secondary | ICD-10-CM | POA: Diagnosis not present

## 2017-12-25 DIAGNOSIS — I129 Hypertensive chronic kidney disease with stage 1 through stage 4 chronic kidney disease, or unspecified chronic kidney disease: Secondary | ICD-10-CM | POA: Diagnosis not present

## 2017-12-25 DIAGNOSIS — I739 Peripheral vascular disease, unspecified: Secondary | ICD-10-CM | POA: Insufficient documentation

## 2017-12-25 DIAGNOSIS — F419 Anxiety disorder, unspecified: Secondary | ICD-10-CM | POA: Insufficient documentation

## 2017-12-25 DIAGNOSIS — Z79891 Long term (current) use of opiate analgesic: Secondary | ICD-10-CM | POA: Diagnosis not present

## 2017-12-25 DIAGNOSIS — Z8673 Personal history of transient ischemic attack (TIA), and cerebral infarction without residual deficits: Secondary | ICD-10-CM | POA: Diagnosis not present

## 2017-12-25 DIAGNOSIS — Z9889 Other specified postprocedural states: Secondary | ICD-10-CM | POA: Insufficient documentation

## 2017-12-25 DIAGNOSIS — K219 Gastro-esophageal reflux disease without esophagitis: Secondary | ICD-10-CM | POA: Diagnosis not present

## 2017-12-25 DIAGNOSIS — Z9011 Acquired absence of right breast and nipple: Secondary | ICD-10-CM | POA: Insufficient documentation

## 2017-12-25 DIAGNOSIS — I700 Atherosclerosis of aorta: Secondary | ICD-10-CM | POA: Insufficient documentation

## 2017-12-25 DIAGNOSIS — I6523 Occlusion and stenosis of bilateral carotid arteries: Secondary | ICD-10-CM | POA: Diagnosis not present

## 2017-12-25 HISTORY — DX: Personal history of antineoplastic chemotherapy: Z92.21

## 2017-12-25 LAB — COMPREHENSIVE METABOLIC PANEL
ALK PHOS: 92 U/L (ref 38–126)
ALT: 28 U/L (ref 0–44)
ANION GAP: 11 (ref 5–15)
AST: 26 U/L (ref 15–41)
Albumin: 4 g/dL (ref 3.5–5.0)
BUN: 10 mg/dL (ref 8–23)
CALCIUM: 9.2 mg/dL (ref 8.9–10.3)
CO2: 25 mmol/L (ref 22–32)
Chloride: 101 mmol/L (ref 98–111)
Creatinine, Ser: 1.05 mg/dL — ABNORMAL HIGH (ref 0.44–1.00)
GFR calc non Af Amer: 52 mL/min — ABNORMAL LOW (ref >60)
Glucose, Bld: 110 mg/dL — ABNORMAL HIGH (ref 70–99)
Potassium: 4.4 mmol/L (ref 3.5–5.1)
SODIUM: 137 mmol/L (ref 135–145)
Total Bilirubin: 0.7 mg/dL (ref 0.3–1.2)
Total Protein: 6.3 g/dL — ABNORMAL LOW (ref 6.5–8.1)

## 2017-12-25 LAB — URINALYSIS, ROUTINE W REFLEX MICROSCOPIC
BILIRUBIN URINE: NEGATIVE
Glucose, UA: NEGATIVE mg/dL
Hgb urine dipstick: NEGATIVE
KETONES UR: NEGATIVE mg/dL
NITRITE: NEGATIVE
Protein, ur: NEGATIVE mg/dL
SPECIFIC GRAVITY, URINE: 1.01 (ref 1.005–1.030)
pH: 7 (ref 5.0–8.0)

## 2017-12-25 LAB — CBC WITH DIFFERENTIAL/PLATELET
Abs Immature Granulocytes: 0 10*3/uL (ref 0.0–0.1)
BASOS PCT: 1 %
Basophils Absolute: 0.1 10*3/uL (ref 0.0–0.1)
EOS ABS: 0.1 10*3/uL (ref 0.0–0.7)
Eosinophils Relative: 2 %
HCT: 44.4 % (ref 36.0–46.0)
Hemoglobin: 14.9 g/dL (ref 12.0–15.0)
IMMATURE GRANULOCYTES: 0 %
LYMPHS ABS: 1.3 10*3/uL (ref 0.7–4.0)
Lymphocytes Relative: 22 %
MCH: 31.4 pg (ref 26.0–34.0)
MCHC: 33.6 g/dL (ref 30.0–36.0)
MCV: 93.7 fL (ref 78.0–100.0)
Monocytes Absolute: 0.7 10*3/uL (ref 0.1–1.0)
Monocytes Relative: 11 %
NEUTROS PCT: 64 %
Neutro Abs: 4 10*3/uL (ref 1.7–7.7)
PLATELETS: 183 10*3/uL (ref 150–400)
RBC: 4.74 MIL/uL (ref 3.87–5.11)
RDW: 13.4 % (ref 11.5–15.5)
WBC: 6.2 10*3/uL (ref 4.0–10.5)

## 2017-12-25 LAB — ABO/RH: ABO/RH(D): O NEG

## 2017-12-25 LAB — PROTIME-INR
INR: 1.04
Prothrombin Time: 13.5 seconds (ref 11.4–15.2)

## 2017-12-25 LAB — URINALYSIS, MICROSCOPIC (REFLEX)
BACTERIA UA: NONE SEEN
Squamous Epithelial / LPF: NONE SEEN (ref 0–5)
WBC UA: NONE SEEN WBC/hpf (ref 0–5)

## 2017-12-25 LAB — TYPE AND SCREEN
ABO/RH(D): O NEG
ANTIBODY SCREEN: NEGATIVE

## 2017-12-25 LAB — SURGICAL PCR SCREEN
MRSA, PCR: NEGATIVE
Staphylococcus aureus: NEGATIVE

## 2017-12-25 LAB — APTT: aPTT: 30 seconds (ref 24–36)

## 2017-12-25 NOTE — Pre-Procedure Instructions (Signed)
Ashley Doyle  12/25/2017      Your procedure is scheduled on January 02, 2018.  Report to [facility] Admitting at 06:00 A.M.  Call this number if you have problems the morning of surgery:  [phone]   Remember:  Do not eat or drink after midnight.    Take these medicines the morning of surgery with A SIP OF WATER : Aspirin Bupropion (Wellburtrin XL) Citalopram (Celexa) Gabapentin (Neurontin) Lorazepam (Ativan) -- if needed Metoprolol (Lopressor) Omeprazole (Prilosec) Oxycodone--if needed   7 days prior to surgery STOP taking any Aleve, Naproxen, Ibuprofen, Motrin, Advil, Goody's, BC's, all herbal medications, fish oil, and all vitamins  NO smoking 24 hours prior to surgery.   Do not wear jewelry, make-up or nail polish.  Do not wear lotions, powders, or perfumes, or deodorant.  Do not shave 48 hours prior to surgery.    Do not bring valuables to the hospital.  [facility] is not responsible for any belongings or valuables.  Contacts, dentures or bridgework may not be worn into surgery.  Leave your suitcase in the car.  After surgery it may be brought to your room.  For patients admitted to the hospital, discharge time will be determined by your treatment team.  Patients discharged the day of surgery will not be allowed to drive home.   Special instructions:   [place]- Preparing For Surgery  Before surgery, you can play an important role. Because skin is not sterile, your skin needs to be as free of germs as possible. You can reduce the number of germs on your skin by washing with CHG (chlorahexidine gluconate) Soap before surgery.  CHG is an antiseptic cleaner which kills germs and bonds with the skin to continue killing germs even after washing.    Oral Hygiene is also important to reduce your risk of infection.  Remember - BRUSH YOUR TEETH THE MORNING OF SURGERY WITH YOUR REGULAR TOOTHPASTE  Please do not use if you have an allergy to CHG or  antibacterial soaps. If your skin becomes reddened/irritated stop using the CHG.  Do not shave (including legs and underarms) for at least 48 hours prior to first CHG shower. It is OK to shave your face.  Please follow these instructions carefully.   1. Shower the NIGHT BEFORE SURGERY and the MORNING OF SURGERY with CHG.   2. If you chose to wash your hair, wash your hair first as usual with your normal shampoo.  3. After you shampoo, rinse your hair and body thoroughly to remove the shampoo.  4. Use CHG as you would any other liquid soap. You can apply CHG directly to the skin and wash gently with a scrungie or a clean washcloth.   5. Apply the CHG Soap to your body ONLY FROM THE NECK DOWN.  Do not use on open wounds or open sores. Avoid contact with your eyes, ears, mouth and genitals (private parts). Wash Face and genitals (private parts)  with your normal soap.  6. Wash thoroughly, paying special attention to the area where your surgery will be performed.  7. Thoroughly rinse your body with warm water from the neck down.  8. DO NOT shower/wash with your normal soap after using and rinsing off the CHG Soap.  9. Pat yourself dry with a CLEAN TOWEL.  10. Wear CLEAN PAJAMAS to bed the night before surgery, wear comfortable clothes the morning of surgery  11. Place CLEAN SHEETS on your bed the  night of your first shower and DO NOT SLEEP WITH PETS.    Day of Surgery:  Do not apply any deodorants/lotions.  Please wear clean clothes to the hospital/surgery center.   Remember to brush your teeth WITH YOUR REGULAR TOOTHPASTE.    Please read over the following fact sheets that you were given.

## 2017-12-25 NOTE — Progress Notes (Addendum)
Anesthesia PAT Evaluation:   Case:  [id number] Date/Time:  01/02/18 0800   Procedure:  EMBOLIZATION OF CAROTID BODY TUMOR (N/A )   Anesthesia type:  General   Pre-op diagnosis:  CAROTID BODY TUMOR   Location:  [place] OR ROOM 09 / [place] OR   Surgeon:  Luanne Bras, MD    She is also scheduled for left carotid body tumor excision on 01/03/2018 by Serafina Mitchell, MD.  DISCUSSION: Patient is a 72 year old female scheduled for the above procedures.   History includes smoking (1 1/2 PPD), COPD/emphysema, asthma, exertional dyspnea, CVA > 10 years ago (right sided weakness; denied current dysphagia), HTN, PVD, bilateral carotid artery stenosis, right breast cancer (s/p chemotherapy, s/p right mastectomy 12/01/16), TB (remote, s/p treatment), Port-a-cath (04/12/16, s/p removal 11/22/16).   She was seen by cardiologist Dr. Bronson Ing on 11/18/17 for preoperative risk stratification. She reported occasional chest pains that could radiate down both arms if she's had an active day. Symptoms could also be associated with SOB, lightheadedness, dizziness, and nausea. Given her symptoms, risk factors, and upcoming surgery, he ordered a stress test which was intermediate risk. Dr. Bronson Ing started her on b-blocker therapy to reduce her perioperative risk. He also switched her statin to Lipitor and started her on ASA.  Today, she reportedly that she felt at her baseline. She denied SOB at rest. She feels her exertional dyspnea is stable (for past ~ 2-3 years). Denied syncope or edema. She denied any current chest pain, but as outlined by cardiology she does report occasional chest pains (located at distal sternum) lasting only "seconds" after she is active--one example is after mopping (not during activity). Although she says she can have associated sweating and dizziness these also only last for a matter of seconds. She doesn't have any associated palpitations/heart racing. Symptoms can occur as often as weekly, but they  have been occurring for ~ 3 years. She reports she can't lie flat on her back due to hip arthritis, not because of breathing. She sleeps on one pillow on her side. She occasionally uses a walker, but doesn't tend to walk long distances due to hip pain (R >L).  She has chronic, intermittent symptoms that were also discussed with her cardiologist who has provided preoperative input following recent stress test. She smokes 1 1/2 ppd, but has not had any recent COPD exacerbations. She is not on home O2. I told there that we want her at her baseline for surgery, so if any changes between now and surgery to seek re-evaluation.    VS: BP 137/65   Pulse 66   Temp 36.6 C   Resp 20   Ht 5\' 2"  (1.575 m)   Wt 76.7 kg   SpO2 96%   BMI 30.91 kg/m  Patient is a pleasant white female in NAD. She is hard of hearing. Her husband was at her side. Heart RRR, no murmur noted. Lungs clear. No pretibial edema noted.    PROVIDERS: Celene Squibb, MD is PCP. Kate Sable, MD is cardiologist. Twana First MD is oncologist. Last visit 09/19/17. She said she is not currently seeing oncology.  LABS: Labs reviewed: Acceptable for surgery. (all labs ordered are listed, but only abnormal results are displayed)  Labs Reviewed  COMPREHENSIVE METABOLIC PANEL - Abnormal; Notable for the following components:      Result Value   Glucose, Bld 110 (*)    Creatinine, Ser 1.05 (*)    Total Protein 6.3 (*)  GFR calc non Af Amer 52 (*)    All other components within normal limits  URINALYSIS, ROUTINE W REFLEX MICROSCOPIC - Abnormal; Notable for the following components:   Color, Urine YELLOW (*)    APPearance CLEAR (*)    Leukocytes, UA TRACE (*)    All other components within normal limits  SURGICAL PCR SCREEN  APTT  PROTIME-INR  CBC WITH DIFFERENTIAL/PLATELET  URINALYSIS, MICROSCOPIC (REFLEX)  TYPE AND SCREEN     IMAGES: CTA neck 11/13/17: IMPRESSION: 1. 75% proximal right ICA stenosis. 2. 70% proximal  left ICA stenosis. 3. 2.3 cm hypervascular mass at the left carotid bifurcation consistent with a carotid body tumor. 4. Moderate bilateral vertebral artery origin stenosis. 5. Aortic Atherosclerosis (ICD10-I70.0) and Emphysema (ICD10-J43.9).  Carotid U/S 10/28/17: Final Interpretation: Right Carotid: Velocities in the right ICA are consistent with a 80-99%        stenosis. Left Carotid: Velocities in the left ICA are consistent with a 60-79% stenosis.       Carotid body tumor measures 1.5cm AP, 1.6cm trans, 2.1cm long. Vertebrals: Bilateral vertebral arteries demonstrate antegrade flow. Subclavians: Normal flow hemodynamics were seen in bilateral subclavian       arteries.   EKG: 11/18/17 (scanned under Nuclear Med tab, but is actually EKG tracing): SR, low voltage in precordial leads.    CV: Nuclear stress test 12/05/17:  No diagnostic ST segment changes over baseline abnormalities.  Medium sized, moderate intensity, mid to apical inferolateral and basal inferior/inferoseptal defects. There is reversibility noted in the mid to apical segment and partial reversibility in the basal segment. This is consistent with potential scar and mild to moderate ischemic territory.  This is an intermediate risk study.  Nuclear stress EF: 61%. (Report reviewed by: Arnoldo Lenis, MD on 12/06/2017 at 1:43 PM EDT Stress tests shows some mild to moderate blockages, overall considered intermediate risk. Will defer to Dr Bronson Ing who will be back early next week regarding the implications for her surgery - Herminio Commons, MD on 12/10/2017 at 1:22 PM EDT She is at increased risk for a cardiac event in the perioperative period. Start metoprolol 25 mg twice daily. This will help reduce perioperative risk.)  Echo 06/14/16: Study Conclusions - Left ventricle: The cavity size was normal. Wall thickness was   normal. Systolic function was normal. The estimated ejection    fraction was in the range of 60% to 65%. Wall motion was normal;   there were no regional wall motion abnormalities. - Right atrium: The atrium was mildly dilated.    Past Medical History:  Diagnosis Date  . Anxiety   . Arthritis   . Asthma    not on inhalers now- no flares recently  . Breast cancer ([place])   . Chronic kidney disease    "as a child" "severe infection and it was toxic"  . Constipation   . COPD (chronic obstructive pulmonary disease) ([place])   . Depression   . Dyspnea    with exertion  . Emphysema of lung ([place])   . Family history of adverse reaction to anesthesia    sister has PONV  . GERD (gastroesophageal reflux disease)   . Headache    history of migraines- none since hysterectomy- has bad headaches at times  . HOH (hard of hearing)   . Hypertension   . Invasive ductal carcinoma of breast, female, right ([place]) 03/07/2016  . Peripheral vascular disease ([place])   . Pinched nerve in neck    and  back  . Sciatica   . Stroke [facility] [address])    2 - right sided weakness- right leg - "speech expressive aphasia when nervous"  . Tuberculosis    "several years ago" 04/11/16  . Wears glasses   . Wears hearing aid    left ear    Past Surgical History:  Procedure Laterality Date  . ABDOMINAL HYSTERECTOMY    . APPENDECTOMY    . Arm surgery Right    injury- cut with glass  . INNER EAR SURGERY Right   . MASTECTOMY WITH RADIOACTIVE SEED GUIDED EXCISION AND AXILLARY SENTINEL LYMPH NODE BIOPSY Right 11/22/2016   Procedure: REMOVAL OF RIGHT BREAST RADIOACTIVE SEED, RIGHT TOTAL MASTECTOMY WITH RADIOACTIVE SEED TARGETED RIGHT AXILLARY LYMPH NODE EXCISION AND AXILLARY SENTINEL LYMPH NODE BIOPSY, POSSIBLE RIGHT AXILLARY DISSECTION;  Surgeon: Rolm Bookbinder, MD;  Location: [place];  Service: General;  Laterality: Right;  . PORT-A-CATH REMOVAL Left 11/22/2016   Procedure: REMOVAL PORT-A-CATH;  Surgeon: Rolm Bookbinder, MD;  Location: [facility];  Service: General;  Laterality: Left;  .  PORTACATH PLACEMENT N/A 04/12/2016   Procedure: INSERTION PORT-A-CATH WITH [place];  Surgeon: Rolm Bookbinder, MD;  Location: [place];  Service: General;  Laterality: N/A;    MEDICATIONS: . aspirin EC 81 MG tablet  . atorvastatin (LIPITOR) 40 MG tablet  . buPROPion (WELLBUTRIN XL) 150 MG 24 hr tablet  . citalopram (CELEXA) 40 MG tablet  . docusate sodium (COLACE) 100 MG capsule  . gabapentin (NEURONTIN) 300 MG capsule  . LORazepam (ATIVAN) 0.5 MG tablet  . methocarbamol (ROBAXIN) 750 MG tablet  . metoprolol tartrate (LOPRESSOR) 25 MG tablet  . Mirabegron (MYRBETRIQ PO)  . omeprazole (PRILOSEC) 20 MG capsule  . oxyCODONE (OXY IR/ROXICODONE) 5 MG immediate release tablet  . triamterene-hydrochlorothiazide (MAXZIDE-25) 37.5-25 MG tablet   No current facility-administered medications for this encounter.     George Hugh [facility] Short Stay Center/Anesthesiology Phone [phone] 12/25/2017 4:31 PM

## 2017-12-25 NOTE — Progress Notes (Signed)
PCP: Dr. [place] Crane Cardiologist: Dr. Bronson Ing  EKG: 11-18-2017, under Media Tab CXR: n/a ECHO: 06-14-2016 Stress Test: 12-05-2017 Cardiac Cath: Denies  Patient denies fever, cough at PAT appointment.  Pt reports chest pain, intermittent with increased activity--happening for 3 years.  Pt reports that cardiologist is aware of symptoms.  Ashley Hail PA-C saw pt during PAT appt.   Consents for two procedures signed, Dr. Estanislado Pandy and Dr. Trula Slade.   Patient verbalized understanding of instructions provided today at the PAT appointment.  Patient asked to review instructions at home and day of surgery.

## 2018-01-01 ENCOUNTER — Other Ambulatory Visit: Payer: Self-pay | Admitting: Radiology

## 2018-01-02 ENCOUNTER — Inpatient Hospital Stay (HOSPITAL_COMMUNITY)
Admission: AD | Admit: 2018-01-02 | Discharge: 2018-01-04 | DRG: 038 | Disposition: A | Discharge status: Home / Self Care | Payer: Medicare HMO | Source: Ambulatory Visit | Attending: Surgery | Admitting: Surgery

## 2018-01-02 ENCOUNTER — Ambulatory Visit (HOSPITAL_COMMUNITY)
Admission: RE | Admit: 2018-01-02 | Discharge: 2018-01-02 | Disposition: A | Discharge status: Home / Self Care | Payer: Medicare HMO | Source: Ambulatory Visit | Attending: Interventional Radiology | Admitting: Interventional Radiology

## 2018-01-02 ENCOUNTER — Encounter (HOSPITAL_COMMUNITY): Payer: Self-pay | Admitting: Certified Registered Nurse Anesthetist

## 2018-01-02 ENCOUNTER — Encounter (HOSPITAL_COMMUNITY)
Admission: AD | Disposition: A | Discharge status: Home / Self Care | Payer: Self-pay | Source: Ambulatory Visit | Attending: Interventional Radiology

## 2018-01-02 ENCOUNTER — Ambulatory Visit: Payer: Medicare HMO | Admitting: Student

## 2018-01-02 ENCOUNTER — Ambulatory Visit (HOSPITAL_COMMUNITY): Payer: Medicare HMO | Admitting: Vascular Surgery

## 2018-01-02 ENCOUNTER — Other Ambulatory Visit: Payer: Self-pay

## 2018-01-02 ENCOUNTER — Ambulatory Visit (HOSPITAL_COMMUNITY): Payer: Medicare HMO

## 2018-01-02 DIAGNOSIS — Z7982 Long term (current) use of aspirin: Secondary | ICD-10-CM

## 2018-01-02 DIAGNOSIS — F17200 Nicotine dependence, unspecified, uncomplicated: Secondary | ICD-10-CM | POA: Diagnosis present

## 2018-01-02 DIAGNOSIS — Z973 Presence of spectacles and contact lenses: Secondary | ICD-10-CM | POA: Diagnosis not present

## 2018-01-02 DIAGNOSIS — Z853 Personal history of malignant neoplasm of breast: Secondary | ICD-10-CM | POA: Diagnosis not present

## 2018-01-02 DIAGNOSIS — J439 Emphysema, unspecified: Secondary | ICD-10-CM | POA: Diagnosis not present

## 2018-01-02 DIAGNOSIS — Z801 Family history of malignant neoplasm of trachea, bronchus and lung: Secondary | ICD-10-CM | POA: Diagnosis not present

## 2018-01-02 DIAGNOSIS — F419 Anxiety disorder, unspecified: Secondary | ICD-10-CM | POA: Diagnosis present

## 2018-01-02 DIAGNOSIS — Z823 Family history of stroke: Secondary | ICD-10-CM | POA: Diagnosis not present

## 2018-01-02 DIAGNOSIS — H9190 Unspecified hearing loss, unspecified ear: Secondary | ICD-10-CM | POA: Diagnosis not present

## 2018-01-02 DIAGNOSIS — Z8249 Family history of ischemic heart disease and other diseases of the circulatory system: Secondary | ICD-10-CM | POA: Diagnosis not present

## 2018-01-02 DIAGNOSIS — E7800 Pure hypercholesterolemia, unspecified: Secondary | ICD-10-CM | POA: Diagnosis present

## 2018-01-02 DIAGNOSIS — D446 Neoplasm of uncertain behavior of carotid body: Principal | ICD-10-CM | POA: Diagnosis present

## 2018-01-02 DIAGNOSIS — Z9221 Personal history of antineoplastic chemotherapy: Secondary | ICD-10-CM

## 2018-01-02 DIAGNOSIS — I6522 Occlusion and stenosis of left carotid artery: Secondary | ICD-10-CM | POA: Diagnosis not present

## 2018-01-02 DIAGNOSIS — I69351 Hemiplegia and hemiparesis following cerebral infarction affecting right dominant side: Secondary | ICD-10-CM | POA: Diagnosis not present

## 2018-01-02 DIAGNOSIS — K219 Gastro-esophageal reflux disease without esophagitis: Secondary | ICD-10-CM | POA: Diagnosis present

## 2018-01-02 DIAGNOSIS — I6529 Occlusion and stenosis of unspecified carotid artery: Secondary | ICD-10-CM | POA: Diagnosis present

## 2018-01-02 DIAGNOSIS — I6523 Occlusion and stenosis of bilateral carotid arteries: Secondary | ICD-10-CM | POA: Diagnosis not present

## 2018-01-02 DIAGNOSIS — I10 Essential (primary) hypertension: Secondary | ICD-10-CM | POA: Diagnosis not present

## 2018-01-02 DIAGNOSIS — I739 Peripheral vascular disease, unspecified: Secondary | ICD-10-CM | POA: Diagnosis not present

## 2018-01-02 DIAGNOSIS — F329 Major depressive disorder, single episode, unspecified: Secondary | ICD-10-CM | POA: Diagnosis present

## 2018-01-02 DIAGNOSIS — I700 Atherosclerosis of aorta: Secondary | ICD-10-CM | POA: Diagnosis not present

## 2018-01-02 DIAGNOSIS — Z90710 Acquired absence of both cervix and uterus: Secondary | ICD-10-CM

## 2018-01-02 DIAGNOSIS — Z974 Presence of external hearing-aid: Secondary | ICD-10-CM

## 2018-01-02 DIAGNOSIS — Z9011 Acquired absence of right breast and nipple: Secondary | ICD-10-CM | POA: Diagnosis not present

## 2018-01-02 DIAGNOSIS — D492 Neoplasm of unspecified behavior of bone, soft tissue, and skin: Secondary | ICD-10-CM | POA: Diagnosis not present

## 2018-01-02 DIAGNOSIS — Z9049 Acquired absence of other specified parts of digestive tract: Secondary | ICD-10-CM

## 2018-01-02 HISTORY — PX: IR NEURO EACH ADD'L AFTER BASIC UNI LEFT (MS): IMG5373

## 2018-01-02 HISTORY — PX: IR ANGIO VERTEBRAL SEL SUBCLAVIAN INNOMINATE UNI R MOD SED: IMG5365

## 2018-01-02 HISTORY — PX: IR ANGIOGRAM FOLLOW UP STUDY: IMG697

## 2018-01-02 HISTORY — PX: IR TRANSCATH/EMBOLIZ: IMG695

## 2018-01-02 HISTORY — PX: RADIOLOGY WITH ANESTHESIA: SHX6223

## 2018-01-02 HISTORY — PX: IR ANGIO EXTERNAL CAROTID SEL EXT CAROTID UNI L MOD SED: IMG5370

## 2018-01-02 HISTORY — PX: IR ANGIO INTRA EXTRACRAN SEL COM CAROTID INNOMINATE BILAT MOD SED: IMG5360

## 2018-01-02 LAB — HEPARIN LEVEL (UNFRACTIONATED): Heparin Unfractionated: <0.1 IU/mL — ABNORMAL LOW (ref 0.30–0.70)

## 2018-01-02 LAB — POCT ACTIVATED CLOTTING TIME: ACTIVATED CLOTTING TIME: 202 s

## 2018-01-02 SURGERY — RADIOLOGY WITH ANESTHESIA
Anesthesia: General

## 2018-01-02 MED ORDER — LIDOCAINE HCL 1 % IJ SOLN
INTRAMUSCULAR | Status: AC
  Filled 2018-01-02: qty 20

## 2018-01-02 MED ORDER — LIDOCAINE 2% (20 MG/ML) 5 ML SYRINGE
INTRAMUSCULAR | Status: DC | PRN
  Administered 2018-01-02: 80 mg via INTRAVENOUS

## 2018-01-02 MED ORDER — NIMODIPINE 30 MG PO CAPS
0.0000 mg | ORAL_CAPSULE | ORAL | Status: AC
  Administered 2018-01-02: 30 mg via ORAL
  Filled 2018-01-02: qty 2
  Filled 2018-01-02: qty 1

## 2018-01-02 MED ORDER — HEPARIN (PORCINE) IN NACL 100-0.45 UNIT/ML-% IJ SOLN
500.0000 [IU]/h | INTRAMUSCULAR | Status: DC
  Administered 2018-01-02: 500 [IU]/h via INTRAVENOUS
  Filled 2018-01-02: qty 250

## 2018-01-02 MED ORDER — MIDAZOLAM HCL 2 MG/2ML IJ SOLN
INTRAMUSCULAR | Status: DC | PRN
  Administered 2018-01-02: 1 mg via INTRAVENOUS

## 2018-01-02 MED ORDER — SODIUM CHLORIDE 0.9 % IV SOLN
INTRAVENOUS | Status: DC | PRN
  Administered 2018-01-02: 40 ug/min via INTRAVENOUS

## 2018-01-02 MED ORDER — OXYCODONE HCL 5 MG PO TABS
5.0000 mg | ORAL_TABLET | Freq: Four times a day (QID) | ORAL | Status: DC | PRN
  Administered 2018-01-02 – 2018-01-04 (×4): 5 mg via ORAL
  Filled 2018-01-02 (×4): qty 1

## 2018-01-02 MED ORDER — ACETAMINOPHEN 650 MG RE SUPP: 650.0000 mg | RECTAL | Status: DC | PRN

## 2018-01-02 MED ORDER — PROPOFOL 10 MG/ML IV BOLUS
INTRAVENOUS | Status: DC | PRN
  Administered 2018-01-02: 130 mg via INTRAVENOUS

## 2018-01-02 MED ORDER — CLEVIDIPINE BUTYRATE 0.5 MG/ML IV EMUL
0.0000 mg/h | INTRAVENOUS | Status: DC
  Administered 2018-01-03: 2 mg/h via INTRAVENOUS
  Filled 2018-01-02: qty 50

## 2018-01-02 MED ORDER — CHLORHEXIDINE GLUCONATE 0.12 % MT SOLN
15.0000 mL | Freq: Two times a day (BID) | OROMUCOSAL | Status: DC
  Administered 2018-01-02 – 2018-01-04 (×3): 15 mL via OROMUCOSAL
  Filled 2018-01-02 (×3): qty 15

## 2018-01-02 MED ORDER — METOPROLOL TARTRATE 25 MG PO TABS
25.0000 mg | ORAL_TABLET | Freq: Two times a day (BID) | ORAL | Status: DC
  Administered 2018-01-02 – 2018-01-04 (×4): 25 mg via ORAL
  Filled 2018-01-02 (×2): qty 1
  Filled 2018-01-02: qty 2
  Filled 2018-01-02: qty 1

## 2018-01-02 MED ORDER — ASPIRIN 81 MG PO CHEW
CHEWABLE_TABLET | ORAL | Status: AC
  Filled 2018-01-02: qty 3

## 2018-01-02 MED ORDER — ONDANSETRON HCL 4 MG/2ML IJ SOLN
INTRAMUSCULAR | Status: DC | PRN
  Administered 2018-01-02: 4 mg via INTRAVENOUS

## 2018-01-02 MED ORDER — ACETAMINOPHEN 160 MG/5ML PO SOLN: 650.0000 mg | ORAL | Status: DC | PRN

## 2018-01-02 MED ORDER — PNEUMOCOCCAL VAC POLYVALENT 25 MCG/0.5ML IJ INJ: 0.5000 mL | INJECTION | INTRAMUSCULAR | Status: DC

## 2018-01-02 MED ORDER — GABAPENTIN 300 MG PO CAPS
300.0000 mg | ORAL_CAPSULE | Freq: Three times a day (TID) | ORAL | Status: DC
  Administered 2018-01-02 – 2018-01-04 (×5): 300 mg via ORAL
  Filled 2018-01-02 (×5): qty 1

## 2018-01-02 MED ORDER — CITALOPRAM HYDROBROMIDE 20 MG PO TABS
40.0000 mg | ORAL_TABLET | Freq: Every day | ORAL | Status: DC
  Administered 2018-01-04: 40 mg via ORAL
  Filled 2018-01-02: qty 2

## 2018-01-02 MED ORDER — CEFAZOLIN SODIUM-DEXTROSE 2-4 GM/100ML-% IV SOLN
2.0000 g | INTRAVENOUS | Status: DC
  Filled 2018-01-02 (×2): qty 100

## 2018-01-02 MED ORDER — SODIUM CHLORIDE 0.9 % IV SOLN
INTRAVENOUS | Status: DC
  Administered 2018-01-02 – 2018-01-03 (×4): via INTRAVENOUS

## 2018-01-02 MED ORDER — ROCURONIUM BROMIDE 10 MG/ML (PF) SYRINGE
PREFILLED_SYRINGE | INTRAVENOUS | Status: DC | PRN
  Administered 2018-01-02: 20 mg via INTRAVENOUS
  Administered 2018-01-02: 50 mg via INTRAVENOUS

## 2018-01-02 MED ORDER — ASPIRIN 81 MG PO CHEW
243.0000 mg | CHEWABLE_TABLET | Freq: Once | ORAL | Status: AC
  Administered 2018-01-02: 243 mg via ORAL

## 2018-01-02 MED ORDER — SUGAMMADEX SODIUM 200 MG/2ML IV SOLN
INTRAVENOUS | Status: DC | PRN
  Administered 2018-01-02 (×2): 100 mg via INTRAVENOUS

## 2018-01-02 MED ORDER — BUPROPION HCL ER (XL) 150 MG PO TB24
150.0000 mg | ORAL_TABLET | Freq: Every day | ORAL | Status: DC
  Administered 2018-01-04: 150 mg via ORAL
  Filled 2018-01-02 (×2): qty 1

## 2018-01-02 MED ORDER — NITROGLYCERIN 1 MG/10 ML FOR IR/CATH LAB
INTRA_ARTERIAL | Status: DC | PRN
  Administered 2018-01-02 (×3): 25 ug via INTRA_ARTERIAL

## 2018-01-02 MED ORDER — HEPARIN SODIUM (PORCINE) 1000 UNIT/ML IJ SOLN
INTRAMUSCULAR | Status: DC | PRN
  Administered 2018-01-02: 1000 [IU] via INTRAVENOUS
  Administered 2018-01-02: 3000 [IU] via INTRAVENOUS

## 2018-01-02 MED ORDER — SODIUM CHLORIDE 0.9 % IV BOLUS
250.0000 mL | Freq: Once | INTRAVENOUS | Status: AC
  Administered 2018-01-02: 250 mL via INTRAVENOUS

## 2018-01-02 MED ORDER — LORAZEPAM 0.5 MG PO TABS
0.5000 mg | ORAL_TABLET | Freq: Three times a day (TID) | ORAL | Status: DC
  Administered 2018-01-02 – 2018-01-04 (×4): 0.5 mg via ORAL
  Filled 2018-01-02 (×4): qty 1

## 2018-01-02 MED ORDER — CEFAZOLIN SODIUM-DEXTROSE 1-4 GM/50ML-% IV SOLN
1.0000 g | INTRAVENOUS | Status: AC
  Filled 2018-01-02: qty 50

## 2018-01-02 MED ORDER — HEPARIN (PORCINE) IN NACL 100-0.45 UNIT/ML-% IJ SOLN
INTRAMUSCULAR | Status: AC
  Filled 2018-01-02: qty 250

## 2018-01-02 MED ORDER — NITROGLYCERIN 1 MG/10 ML FOR IR/CATH LAB
INTRA_ARTERIAL | Status: AC
  Filled 2018-01-02: qty 10

## 2018-01-02 MED ORDER — DOCUSATE SODIUM 100 MG PO CAPS: 100.0000 mg | ORAL_CAPSULE | Freq: Every day | ORAL | Status: DC | PRN

## 2018-01-02 MED ORDER — TRIAMTERENE-HCTZ 37.5-25 MG PO TABS
1.0000 | ORAL_TABLET | Freq: Every day | ORAL | Status: DC
  Administered 2018-01-04: 1 via ORAL
  Filled 2018-01-02 (×2): qty 1

## 2018-01-02 MED ORDER — IOPAMIDOL (ISOVUE-300) INJECTION 61%
INTRAVENOUS | Status: AC
  Filled 2018-01-02: qty 50

## 2018-01-02 MED ORDER — SODIUM CHLORIDE 0.9 % IV SOLN
INTRAVENOUS | Status: DC
  Administered 2018-01-02: 08:00:00 via INTRAVENOUS

## 2018-01-02 MED ORDER — CEFAZOLIN SODIUM-DEXTROSE 2-4 GM/100ML-% IV SOLN
2.0000 g | INTRAVENOUS | Status: AC
  Administered 2018-01-02: 2 g via INTRAVENOUS

## 2018-01-02 MED ORDER — ORAL CARE MOUTH RINSE
15.0000 mL | Freq: Two times a day (BID) | OROMUCOSAL | Status: DC
  Administered 2018-01-02: 15 mL via OROMUCOSAL

## 2018-01-02 MED ORDER — ASPIRIN EC 81 MG PO TBEC
81.0000 mg | DELAYED_RELEASE_TABLET | Freq: Every day | ORAL | Status: DC
  Administered 2018-01-04: 81 mg via ORAL
  Filled 2018-01-02: qty 1

## 2018-01-02 MED ORDER — ASPIRIN EC 325 MG PO TBEC
325.0000 mg | DELAYED_RELEASE_TABLET | ORAL | Status: DC
  Filled 2018-01-02: qty 1

## 2018-01-02 MED ORDER — ACETAMINOPHEN 325 MG PO TABS
650.0000 mg | ORAL_TABLET | ORAL | Status: DC | PRN
  Administered 2018-01-02 – 2018-01-04 (×3): 650 mg via ORAL
  Filled 2018-01-02 (×3): qty 2

## 2018-01-02 MED ORDER — IOHEXOL 300 MG/ML  SOLN
120.0000 mL | Freq: Once | INTRAMUSCULAR | Status: AC | PRN
  Administered 2018-01-02: 120 mL via INTRA_ARTERIAL

## 2018-01-02 MED ORDER — DEXAMETHASONE SODIUM PHOSPHATE 10 MG/ML IJ SOLN
INTRAMUSCULAR | Status: DC | PRN
  Administered 2018-01-02: 5 mg via INTRAVENOUS

## 2018-01-02 MED ORDER — ATORVASTATIN CALCIUM 40 MG PO TABS
40.0000 mg | ORAL_TABLET | Freq: Every day | ORAL | Status: DC
  Administered 2018-01-03: 40 mg via ORAL
  Filled 2018-01-02: qty 1

## 2018-01-02 MED ORDER — FENTANYL CITRATE (PF) 250 MCG/5ML IJ SOLN
INTRAMUSCULAR | Status: DC | PRN
  Administered 2018-01-02 (×2): 25 ug via INTRAVENOUS
  Administered 2018-01-02: 50 ug via INTRAVENOUS

## 2018-01-02 NOTE — Sedation Documentation (Signed)
Patient under the care of Anesthesia  

## 2018-01-02 NOTE — Progress Notes (Signed)
Patient ID: Ashley Doyle, female   DOB: December 09, 1945, 72 y.o.   MRN: [id number] INR. Post procedure . Extubated without difficulty.Maintaing PAO2 sats>95 %  Awake ,following commands appropriately. Oriented to place and year. Denies any N/V or visual symptoms. Speech gradually clearing sec to intubation. Pupils  RT = LT 39mm sluggish. No facial asymmetry..tongue midline. Motor. Moves all 4s equally. RT groin soft.Pulses palpable +1 DPs and PTs bilaterally.  S.Novis League MD

## 2018-01-02 NOTE — Anesthesia Procedure Notes (Signed)
Procedure Name: MAC Date/Time: 01/02/2018 9:19 AM Performed by: White, Amedeo Plenty, CRNA Pre-anesthesia Checklist: Patient identified, Emergency Drugs available, Suction available and Patient being monitored Patient Re-evaluated:Patient Re-evaluated prior to induction Oxygen Delivery Method: Nasal cannula

## 2018-01-02 NOTE — Transfer of Care (Signed)
Immediate Anesthesia Transfer of Care Note  Patient: Ashley Doyle  Procedure(s) Performed: EMBOLIZATION OF CAROTID BODY TUMOR (N/A )  Patient Location: PACU  Anesthesia Type:General  Level of Consciousness: awake, alert , oriented and patient cooperative  Airway & Oxygen Therapy: Patient Spontanous Breathing and Patient connected to nasal cannula oxygen  Post-op Assessment: Report given to RN and Post -op Vital signs reviewed and stable  Post vital signs: Reviewed and stable  Last Vitals:  Vitals Value Taken Time  BP 122/64 01/02/2018 12:06 PM  Temp    Pulse 72 01/02/2018 12:08 PM  Resp 19 01/02/2018 12:08 PM  SpO2 100 % 01/02/2018 12:08 PM  Vitals shown include unvalidated device data.  Last Pain:  Vitals:   01/02/18 0525  TempSrc: Oral         Complications: No apparent anesthesia complications

## 2018-01-02 NOTE — Anesthesia Preprocedure Evaluation (Addendum)
Anesthesia Evaluation  Patient identified by MRN, date of birth, ID band Patient awake    Airway Mallampati: II  TM Distance: >3 FB     Dental   Pulmonary shortness of breath, asthma , COPD, Current Smoker,    breath sounds clear to auscultation       Cardiovascular hypertension, + Peripheral Vascular Disease   Rhythm:Regular Rate:Normal     Neuro/Psych    GI/Hepatic Neg liver ROS, GERD  ,  Endo/Other  negative endocrine ROS  Renal/GU Renal disease     Musculoskeletal   Abdominal   Peds  Hematology   Anesthesia Other Findings   Reproductive/Obstetrics                             Anesthesia Physical Anesthesia Plan  ASA: III  Anesthesia Plan: General   Post-op Pain Management:    Induction: Intravenous  PONV Risk Score and Plan: Ondansetron, Dexamethasone and Midazolam  Airway Management Planned: Nasal Cannula, Simple Face Mask and Oral ETT  Additional Equipment:   Intra-op Plan:   Post-operative Plan: Possible Post-op intubation/ventilation  Informed Consent: I have reviewed the patients History and Physical, chart, labs and discussed the procedure including the risks, benefits and alternatives for the proposed anesthesia with the patient or authorized representative who has indicated his/her understanding and acceptance.   Dental advisory given  Plan Discussed with: CRNA and Anesthesiologist  Anesthesia Plan Comments:        Anesthesia Quick Evaluation

## 2018-01-02 NOTE — Progress Notes (Signed)
Spouse in 4N room with pt. She is answering questions appropriately and following commands.

## 2018-01-02 NOTE — Procedures (Signed)
S/P bilateral common carotid,bilateral vertebral arteriograms ,and Lt ECA arteriogram with superselective embolization of x3 prominent arterial feeders arising from the ascending pharyngeal A using  PVA particles sizes 250 to 500 microns and 500 to 700 microns with approx 80 % devascularization.

## 2018-01-02 NOTE — Progress Notes (Signed)
[facility] HPI: Ashley Doyle a 72 y.o.female, who returns for follow up of her carotid disease and left carotid body tumor. The patient has a history of stroke secondary to hypertension approximately 10 years ago which have left her with some right-sided weakness and occasional swallowing difficulty and trouble talking. She recently had an ultrasound which showed bilateral high-grade carotid lesions and a possible left carotid body tumor.   She had a procedure today with IR Dr. Estanislado Pandy : S/P bilateral common carotid,bilateral vertebral arteriograms ,and Lt ECA arteriogram with superselective embolization of x3 prominent arterial feeders arising from the ascending pharyngeal A using  PVA particles sizes 250 to 500 microns and 500 to 700 microns with approx 80 % devascularization.  Dr.Brabham has surgery planed tomorrow for removal of her left carotid body tumor and simultaneous left carotid endarterectomy.      Subjective  - sleepy, but alert to questions.   Objective 104/60 67 97.8 F (36.6 C) (Oral) 15 96%  Intake/Output Summary (Last 24 hours) at 01/02/2018 1410 Last data filed at 01/02/2018 1253 Gross per 24 hour  Intake 1000 ml  Output 740 ml  Net 260 ml    Moving all 4 ext Palpable DP/radial pulses B Right groin soft without hematoma Speech clear Heart RRR Abdomin soft  Studies I have ordered and reviewed her CT scan with the following results: 1. 75% proximal right ICA stenosis. 2. 70% proximal left ICA stenosis. 3. 2.3 cm hypervascular mass at the left carotid bifurcation consistent with a carotid body tumor. 4. Moderate bilateral vertebral artery origin stenosis. 5. Aortic Atherosclerosis (ICD10-I70.0) and Emphysema (ICD10-J43.9).  Assessment/Planning: S/P  bilateral common carotid,bilateral vertebral arteriograms ,and Lt ECA arteriogram with superselective embolization of x3 prominent arterial feeders arising from the  ascending pharyngeal A using  PVA particles sizes 250 to 500 microns and 500 to 700 microns with approx 80 % devascularization.  Plan:  removal of her left carotid body tumor and simultaneous left carotid endarterectomy By Dr. Trula Slade 01/03/2018. NPO past MN    Roxy Horseman 01/02/2018 2:10 PM --  Laboratory Lab Results: No results for input(s): WBC, HGB, HCT, PLT in the last 72 hours. BMET No results for input(s): NA, K, CL, CO2, GLUCOSE, BUN, CREATININE, CALCIUM in the last 72 hours.  COAG Lab Results  Component Value Date   INR 1.04 12/25/2017   No results found for: PTT

## 2018-01-02 NOTE — Anesthesia Procedure Notes (Signed)
Procedure Name: Intubation Date/Time: 01/02/2018 10:02 AM Performed by: White, Amedeo Plenty, CRNA Pre-anesthesia Checklist: Patient identified, Emergency Drugs available, Suction available and Patient being monitored Patient Re-evaluated:Patient Re-evaluated prior to induction Oxygen Delivery Method: Circle System Utilized Preoxygenation: Pre-oxygenation with 100% oxygen Induction Type: IV induction Ventilation: Mask ventilation without difficulty Laryngoscope Size: Mac and 3 Grade View: Grade I Tube type: Oral Tube size: 7.0 mm Number of attempts: 1 Airway Equipment and Method: Stylet Placement Confirmation: ETT inserted through vocal cords under direct vision,  positive ETCO2 and breath sounds checked- equal and bilateral Secured at: 22 cm Tube secured with: Tape Dental Injury: Teeth and Oropharynx as per pre-operative assessment

## 2018-01-02 NOTE — H&P (Signed)
Chief Complaint: Patient was seen in consultation today for cerebral arteriogram with possible left carotid body tumor embolization at the request of Dr Orvan Falconer   Supervising Physician: Luanne Bras  Patient Status: [facility] - Out-pt  History of Present Illness: Ashley Doyle is a 72 y.o. female   Pt of Dr Trula Slade Scheduled for left carotid body tumor resection and left carotid artery endarterectomy in OR tomorrow Scheduled first in IR with Dr Estanislado Pandy for left carotid body tumor embolization today  11/13/17 CTA:  1. 75% proximal right ICA stenosis. 2. 70% proximal left ICA stenosis. 3. 2.3 cm hypervascular mass at the left carotid bifurcation consistent with a carotid body tumor. 4. Moderate bilateral vertebral artery origin stenosis. 5. Aortic Atherosclerosis (ICD10-I70.0) and Emphysema (ICD10-J43.9).  Pt is well Denies N/V; denies headache or pain Denies neurologic symptoms   Past Medical History:  Diagnosis Date  . Anxiety   . Arthritis   . Asthma    not on inhalers now- no flares recently  . Breast cancer ([place])   . Chronic kidney disease    "as a child" "severe infection and it was toxic"  . Constipation   . COPD (chronic obstructive pulmonary disease) ([place])   . Depression   . Dyspnea    with exertion  . Emphysema of lung ([place])   . Family history of adverse reaction to anesthesia    sister has PONV  . GERD (gastroesophageal reflux disease)   . Headache    history of migraines- none since hysterectomy- has bad headaches at times  . History of chemotherapy   . HOH (hard of hearing)   . Hypertension   . Invasive ductal carcinoma of breast, female, right ([place]) 03/07/2016  . Peripheral vascular disease ([place])   . Pinched nerve in neck    and back  . Sciatica   . Stroke [facility])    2 - right sided weakness- right leg - "speech expressive aphasia when nervous"  . Tuberculosis    "several years ago" 04/11/16  . Wears glasses   . Wears hearing aid    left ear    Past Surgical History:  Procedure Laterality Date  . ABDOMINAL HYSTERECTOMY    . APPENDECTOMY    . Arm surgery Right    injury- cut with glass  . INNER EAR SURGERY Right   . MASTECTOMY WITH RADIOACTIVE SEED GUIDED EXCISION AND AXILLARY SENTINEL LYMPH NODE BIOPSY Right 11/22/2016   Procedure: REMOVAL OF RIGHT BREAST RADIOACTIVE SEED, RIGHT TOTAL MASTECTOMY WITH RADIOACTIVE SEED TARGETED RIGHT AXILLARY LYMPH NODE EXCISION AND AXILLARY SENTINEL LYMPH NODE BIOPSY, POSSIBLE RIGHT AXILLARY DISSECTION;  Surgeon: Rolm Bookbinder, MD;  Location: [place];  Service: General;  Laterality: Right;  . PORT-A-CATH REMOVAL Left 11/22/2016   Procedure: REMOVAL PORT-A-CATH;  Surgeon: Rolm Bookbinder, MD;  Location: [place];  Service: General;  Laterality: Left;  . PORTACATH PLACEMENT N/A 04/12/2016   Procedure: INSERTION PORT-A-CATH WITH [place];  Surgeon: Rolm Bookbinder, MD;  Location: [place];  Service: General;  Laterality: N/A;    Allergies: No known allergies  Medications: Prior to Admission medications   Medication Sig Start Date End Date Taking? Authorizing Provider  aspirin EC 81 MG tablet Take 1 tablet (81 mg total) by mouth daily. 11/18/17  Yes Herminio Commons, MD  atorvastatin (LIPITOR) 40 MG tablet Take 1 tablet (40 mg total) by mouth daily. 11/18/17 02/16/18 Yes Herminio Commons, MD  buPROPion (WELLBUTRIN XL) 150 MG 24 hr tablet Take 150 mg by  mouth daily.   Yes [provider]  citalopram (CELEXA) 40 MG tablet Take 40 mg by mouth daily. 01/28/16  Yes [provider]  docusate sodium (COLACE) 100 MG capsule Take 100 mg by mouth daily as needed for mild constipation.   Yes [provider]  gabapentin (NEURONTIN) 300 MG capsule Take 1 capsule (300 mg total) by mouth 3 (three) times daily. 12/21/16  Yes Holley Bouche, NP  LORazepam (ATIVAN) 0.5 MG tablet Take 1 tablet (0.5 mg total) by mouth every 8 (eight) hours. Patient taking differently: Take 0.5 mg  by mouth every 8 (eight) hours as needed for anxiety.  09/19/16  Yes Twana First, MD  methocarbamol (ROBAXIN) 750 MG tablet Take 1 tablet (750 mg total) by mouth every 8 (eight) hours as needed (use for muscle cramps/pain). 11/23/16  Yes Rolm Bookbinder, MD  metoprolol tartrate (LOPRESSOR) 25 MG tablet Take 1 tablet (25 mg total) by mouth 2 (two) times daily. 12/12/17 03/12/18 Yes Herminio Commons, MD  omeprazole (PRILOSEC) 20 MG capsule TAKE 1 CAPSULE BY MOUTH EVERY DAY Patient taking differently: Take 20 mg by mouth daily.  07/07/17  Yes Holley Bouche, NP  oxyCODONE (OXY IR/ROXICODONE) 5 MG immediate release tablet Take 1 tablet (5 mg total) by mouth every 6 (six) hours as needed for moderate pain, severe pain or breakthrough pain. 11/22/16  Yes Rolm Bookbinder, MD  triamterene-hydrochlorothiazide (MAXZIDE-25) 37.5-25 MG tablet Take 1 tablet by mouth daily. 02/02/16  Yes [provider]     Family History  Problem Relation Age of Onset  . Stroke Mother   . Heart disease Mother   . Lung cancer Father     Social History   Socioeconomic History  . Marital status: Married    Spouse name: Not on file  . Number of children: Not on file  . Years of education: Not on file  . Highest education level: Not on file  Occupational History  . Not on file  Social Needs  . Financial resource strain: Not on file  . Food insecurity:    Worry: Not on file    Inability: Not on file  . Transportation needs:    Medical: Not on file    Non-medical: Not on file  Tobacco Use  . Smoking status: Current Every Day Smoker    Packs/day: 1.50    Years: 54.00    Pack years: 81.00    Types: Cigarettes  . Smokeless tobacco: Never Used  . Tobacco comment: 33-35 per day  Substance and Sexual Activity  . Alcohol use: No  . Drug use: No  . Sexual activity: Not on file    Comment: hysterectomy  Lifestyle  . Physical activity:    Days per week: Not on file    Minutes per session: Not on  file  . Stress: Not on file  Relationships  . Social connections:    Talks on phone: Not on file    Gets together: Not on file    Attends religious service: Not on file    Active member of club or organization: Not on file    Attends meetings of clubs or organizations: Not on file    Relationship status: Not on file  Other Topics Concern  . Not on file  Social History Narrative  . Not on file     Review of Systems: A 12 point ROS discussed and pertinent positives are indicated in the HPI above.  All other systems are negative.  Review of Systems  Constitutional: Negative for activity change, fatigue and fever.  HENT: Negative for tinnitus and trouble swallowing.   Eyes: Negative for visual disturbance.  Respiratory: Negative for cough and shortness of breath.   Cardiovascular: Negative for chest pain.  Gastrointestinal: Negative for abdominal pain.  Neurological: Negative for dizziness, tremors, seizures, syncope, facial asymmetry, speech difficulty, weakness, light-headedness, numbness and headaches.  Psychiatric/Behavioral: Negative for behavioral problems and confusion.    Vital Signs: BP 138/60   Pulse 63   Temp (!) 97.4 F (36.3 C) (Oral)   Resp 20   SpO2 98%   Physical Exam  Constitutional: She is oriented to person, place, and time. She appears well-nourished.  HENT:  Head: Atraumatic.  Eyes: EOM are normal.  Neck: Neck supple.  Cardiovascular: Normal rate, regular rhythm and normal heart sounds.  No murmur heard. Pulmonary/Chest: Effort normal and breath sounds normal.  Abdominal: Soft. Bowel sounds are normal.  Musculoskeletal: Normal range of motion.  Neurological: She is alert and oriented to person, place, and time.  Skin: Skin is warm and dry.  Psychiatric: She has a normal mood and affect. Her behavior is normal. Judgment and thought content normal.  Vitals reviewed.   Imaging: Nm Myocar Multi W/spect W/wall Motion / Ef  Result Date:  12/05/2017  No diagnostic ST segment changes over baseline abnormalities.  Medium sized, moderate intensity, mid to apical inferolateral and basal inferior/inferoseptal defects. There is reversibility noted in the mid to apical segment and partial reversibility in the basal segment. This is consistent with potential scar and mild to moderate ischemic territory.  This is an intermediate risk study.  Nuclear stress EF: 61%.     Labs:  CBC: Recent Labs    12/25/17 1423  WBC 6.2  HGB 14.9  HCT 44.4  PLT 183    COAGS: Recent Labs    12/25/17 1423  INR 1.04  APTT 30    BMP: Recent Labs    12/25/17 1423  NA 137  K 4.4  CL 101  CO2 25  GLUCOSE 110*  BUN 10  CALCIUM 9.2  CREATININE 1.05*  GFRNONAA 52*  GFRAA >60    LIVER FUNCTION TESTS: Recent Labs    12/25/17 1423  BILITOT 0.7  AST 26  ALT 28  ALKPHOS 92  PROT 6.3*  ALBUMIN 4.0    TUMOR MARKERS: No results for input(s): AFPTM, CEA, CA199, CHROMGRNA in the last 8760 hours.  Assessment and Plan:  Left carotid body tumor embolization in IR today Scheduled for OR with Dr Trula Slade tomorrow for resection of same and Left carotid artery endarterectomy Risks and benefits of cerebral angiogram with intervention were discussed with the patient including, but not limited to bleeding, infection, vascular injury, contrast induced renal failure, stroke or even death.  This interventional procedure involves the use of X-rays and because of the nature of the planned procedure, it is possible that we will have prolonged use of X-ray fluoroscopy.  Potential radiation risks to you include (but are not limited to) the following: - A slightly elevated risk for cancer  several years later in life. This risk is typically less than 0.5% percent. This risk is low in comparison to the normal incidence of human cancer, which is 33% for women and 50% for men according to the [place]. - Radiation induced injury can  include skin redness, resembling a rash, tissue breakdown / ulcers and hair loss (which can be temporary or permanent).   The likelihood  of either of these occurring depends on the difficulty of the procedure and whether you are sensitive to radiation due to previous procedures, disease, or genetic conditions.   IF your procedure requires a prolonged use of radiation, you will be notified and given written instructions for further action.  It is your responsibility to monitor the irradiated area for the 2 weeks following the procedure and to notify your physician if you are concerned that you have suffered a radiation induced injury.    All of the patient's questions were answered, patient is agreeable to proceed.  Consent signed and in chart.  Thank you for this interesting consult.  I greatly enjoyed meeting Ashley Doyle and look forward to participating in their care.  A copy of this report was sent to the requesting provider on this date.  Electronically Signed: Lavonia Drafts, PA-C 01/02/2018, 7:06 AM   I spent a total of  30 Minutes   in face to face in clinical consultation, greater than 50% of which was counseling/coordinating care for cerebral arteriogram and left carotid body tumor embolization

## 2018-01-02 NOTE — Progress Notes (Signed)
Pt seen at bedside. Awake. Denies HA, blurred vision, N/V. Has tolerated some liquids. No swallowing difficulties.  BP 104/60 (BP Location: Left Arm)   Pulse 67   Temp 97.8 F (36.6 C) (Oral)   Resp 15   Ht 5\' 2"  (1.575 m)   Wt 78 kg   SpO2 96%   BMI 31.45 kg/m  Neuro: A&O x 3 PERRLA, EOMI Face/tongue symmetric No drift. Strength 5/5 and equal  Ext: (R)groin soft, NT, no hematoma Good distal pulses.  S/p superselective particle embolization of arterial feeders to (L)carotid body tumor. Reviewed and ordered home meds. BP improved. Dr. Trula Slade to see to discuss surgical plans for tomorrow.  Ascencion Dike PA-C Interventional Radiology 01/02/2018 3:36 PM

## 2018-01-02 NOTE — Anesthesia Postprocedure Evaluation (Signed)
Anesthesia Post Note  Patient: Ashley Doyle  Procedure(s) Performed: EMBOLIZATION OF CAROTID BODY TUMOR (N/A )     Patient location during evaluation: PACU Anesthesia Type: General Level of consciousness: awake Pain management: pain level controlled Vital Signs Assessment: post-procedure vital signs reviewed and stable Respiratory status: spontaneous breathing Cardiovascular status: stable Anesthetic complications: no    Last Vitals:  Vitals:   01/02/18 1249 01/02/18 1300  BP:    Pulse: 63   Resp: 20   Temp:  36.6 C  SpO2: 96%     Last Pain:  Vitals:   01/02/18 1300  TempSrc: Oral  PainSc:                  Lewis Keats

## 2018-01-02 NOTE — Anesthesia Preprocedure Evaluation (Addendum)
Anesthesia Evaluation  Patient identified by MRN, date of birth, ID band Patient awake    Reviewed: Allergy & Precautions, NPO status , Patient's Chart, lab work & pertinent test results  History of Anesthesia Complications Negative for: history of anesthetic complications  Airway Mallampati: III  TM Distance: <3 FB Neck ROM: Full    Dental  (+) Edentulous Lower, Edentulous Upper, Dental Advisory Given   Pulmonary asthma , COPD, Current Smoker,    breath sounds clear to auscultation       Cardiovascular hypertension, Pt. on home beta blockers and Pt. on medications (-) angina+ Peripheral Vascular Disease   Rhythm:Regular Rate:Normal   '19 Nuclear Stress - No diagnostic ST segment changes over baseline abnormalities. Medium sized, moderate intensity, mid to apical inferolateral and basal inferior/inferoseptal defects. There is reversibility noted in the mid to apical segment and partial reversibility in the basal segment. This is consistent with potential scar and mild to moderate ischemic territory. This is an intermediate risk study. Nuclear stress EF: 61%.  '19 Carotid Duplex - Right Carotid: Velocities in the right ICA are consistent with a 80-99% stenosis. Left Carotid: Velocities in the left ICA are consistent with a 60-79% stenosis. Carotid body tumor measures 1.5cm AP, 1.6cm trans, 2.1cm long.  '18 TTE - EF 60% to 65%. Right atrium was mildly dilated.  From PAT - "Seen by cardiologist Dr. Bronson Ing on 11/18/17 for preoperative risk stratification. She reported occasional chest pains that could radiate down both arms if she's had an active day. Symptoms could also be associated with SOB, lightheadedness, dizziness, and nausea. Given her symptoms, risk factors, and upcoming surgery, he ordered a stress test which was intermediate risk. Dr. Bronson Ing started her on b-blocker therapy to reduce her perioperative risk. He also  switched her statin to Lipitor and started her on ASA."   Neuro/Psych  Headaches, Anxiety Depression CVA (right sided weakness), Residual Symptoms    GI/Hepatic Neg liver ROS, GERD  Controlled and Medicated,  Endo/Other   Obesity   Renal/GU negative Renal ROS  negative genitourinary   Musculoskeletal  (+) Arthritis ,   Abdominal   Peds  Hematology negative hematology ROS (+)   Anesthesia Other Findings   Reproductive/Obstetrics                           Anesthesia Physical Anesthesia Plan  ASA: III  Anesthesia Plan: General   Post-op Pain Management:    Induction: Intravenous  PONV Risk Score and Plan: 3 and Treatment may vary due to age or medical condition, Ondansetron and Dexamethasone  Airway Management Planned: Oral ETT  Additional Equipment: Arterial line  Intra-op Plan:   Post-operative Plan: Extubation in OR  Informed Consent: I have reviewed the patients History and Physical, chart, labs and discussed the procedure including the risks, benefits and alternatives for the proposed anesthesia with the patient or authorized representative who has indicated his/her understanding and acceptance.   Dental advisory given  Plan Discussed with: CRNA, Anesthesiologist and Surgeon  Anesthesia Plan Comments:       Anesthesia Quick Evaluation

## 2018-01-03 ENCOUNTER — Inpatient Hospital Stay (HOSPITAL_COMMUNITY): Admission: RE | Admit: 2018-01-03 | Payer: Medicare HMO | Source: Ambulatory Visit | Admitting: Surgery

## 2018-01-03 ENCOUNTER — Encounter (HOSPITAL_COMMUNITY): Payer: Self-pay | Admitting: Certified Registered"

## 2018-01-03 ENCOUNTER — Encounter (HOSPITAL_COMMUNITY)
Admission: AD | Disposition: A | Discharge status: Home / Self Care | Payer: Self-pay | Source: Ambulatory Visit | Attending: Interventional Radiology

## 2018-01-03 ENCOUNTER — Telehealth: Payer: Self-pay | Admitting: Surgery

## 2018-01-03 ENCOUNTER — Inpatient Hospital Stay (HOSPITAL_COMMUNITY): Payer: Medicare HMO | Admitting: Anesthesiology

## 2018-01-03 DIAGNOSIS — I6522 Occlusion and stenosis of left carotid artery: Secondary | ICD-10-CM

## 2018-01-03 DIAGNOSIS — I6529 Occlusion and stenosis of unspecified carotid artery: Secondary | ICD-10-CM | POA: Diagnosis present

## 2018-01-03 DIAGNOSIS — D446 Neoplasm of uncertain behavior of carotid body: Secondary | ICD-10-CM

## 2018-01-03 HISTORY — PX: ENDARTERECTOMY: SHX5162

## 2018-01-03 HISTORY — PX: CAROTID BODY TUMOR EXCISION: SHX5156

## 2018-01-03 HISTORY — PX: PATCH ANGIOPLASTY: SHX6230

## 2018-01-03 LAB — CBC WITH DIFFERENTIAL/PLATELET
BASOS PCT: 0 %
Basophils Absolute: 0 10*3/uL (ref 0.0–0.1)
Eosinophils Absolute: 0 10*3/uL (ref 0.0–0.7)
Eosinophils Relative: 0 %
HCT: 41.3 % (ref 36.0–46.0)
HEMOGLOBIN: 14.1 g/dL (ref 12.0–15.0)
LYMPHS ABS: 1.1 10*3/uL (ref 0.7–4.0)
LYMPHS PCT: 14 %
MCH: 31.5 pg (ref 26.0–34.0)
MCHC: 34.1 g/dL (ref 30.0–36.0)
MCV: 92.4 fL (ref 78.0–100.0)
MONOS PCT: 6 %
Monocytes Absolute: 0.5 10*3/uL (ref 0.1–1.0)
NEUTROS ABS: 6.2 10*3/uL (ref 1.7–7.7)
Neutrophils Relative %: 80 %
Platelets: 186 10*3/uL (ref 150–400)
RBC: 4.47 MIL/uL (ref 3.87–5.11)
RDW: 13.4 % (ref 11.5–15.5)
WBC: 7.8 10*3/uL (ref 4.0–10.5)

## 2018-01-03 LAB — PROTIME-INR
INR: 1.06
Prothrombin Time: 13.7 seconds (ref 11.4–15.2)

## 2018-01-03 LAB — POCT ACTIVATED CLOTTING TIME
ACTIVATED CLOTTING TIME: 263 s
ACTIVATED CLOTTING TIME: 213 s

## 2018-01-03 LAB — BASIC METABOLIC PANEL
ANION GAP: 5 (ref 5–15)
BUN: 13 mg/dL (ref 8–23)
CALCIUM: 8.7 mg/dL — AB (ref 8.9–10.3)
CO2: 25 mmol/L (ref 22–32)
Chloride: 107 mmol/L (ref 98–111)
Creatinine, Ser: 0.84 mg/dL (ref 0.44–1.00)
GFR calc Af Amer: >60 mL/min (ref >60)
GFR calc non Af Amer: >60 mL/min (ref >60)
GLUCOSE: 138 mg/dL — AB (ref 70–99)
POTASSIUM: 4.1 mmol/L (ref 3.5–5.1)
Sodium: 137 mmol/L (ref 135–145)

## 2018-01-03 LAB — POCT I-STAT 7, (LYTES, BLD GAS, ICA,H+H)
BICARBONATE: 25.3 mmol/L (ref 20.0–28.0)
Calcium, Ion: 1.16 mmol/L (ref 1.15–1.40)
HEMATOCRIT: 36 % (ref 36.0–46.0)
Hemoglobin: 12.2 g/dL (ref 12.0–15.0)
O2 Saturation: 99 %
PH ART: 7.389 (ref 7.350–7.450)
PO2 ART: 118 mmHg — AB (ref 83.0–108.0)
Patient temperature: 36.5
Potassium: 3.6 mmol/L (ref 3.5–5.1)
Sodium: 136 mmol/L (ref 135–145)
TCO2: 27 mmol/L (ref 22–32)
pCO2 arterial: 41.7 mmHg (ref 32.0–48.0)

## 2018-01-03 SURGERY — EXCISION, NEOPLASM, CAROTID BODY
Anesthesia: General | Site: Neck | Laterality: Left

## 2018-01-03 MED ORDER — ONDANSETRON HCL 4 MG/2ML IJ SOLN: 4.0000 mg | Freq: Four times a day (QID) | INTRAMUSCULAR | Status: DC | PRN

## 2018-01-03 MED ORDER — LIDOCAINE HCL 1 % IJ SOLN
INTRAMUSCULAR | Status: AC
  Filled 2018-01-03: qty 20

## 2018-01-03 MED ORDER — LIDOCAINE 2% (20 MG/ML) 5 ML SYRINGE
INTRAMUSCULAR | Status: AC
  Filled 2018-01-03: qty 5

## 2018-01-03 MED ORDER — DEXAMETHASONE SODIUM PHOSPHATE 10 MG/ML IJ SOLN
INTRAMUSCULAR | Status: DC | PRN
  Administered 2018-01-03: 10 mg via INTRAVENOUS

## 2018-01-03 MED ORDER — POTASSIUM CHLORIDE CRYS ER 20 MEQ PO TBCR: 20.0000 meq | EXTENDED_RELEASE_TABLET | Freq: Every day | ORAL | Status: DC | PRN

## 2018-01-03 MED ORDER — OXYCODONE HCL 5 MG/5ML PO SOLN: 5.0000 mg | Freq: Once | ORAL | Status: AC | PRN

## 2018-01-03 MED ORDER — METOPROLOL TARTRATE 5 MG/5ML IV SOLN: 2.0000 mg | INTRAVENOUS | Status: DC | PRN

## 2018-01-03 MED ORDER — PROPOFOL 10 MG/ML IV BOLUS
INTRAVENOUS | Status: DC | PRN
  Administered 2018-01-03: 100 mg via INTRAVENOUS
  Administered 2018-01-03: 20 mg via INTRAVENOUS
  Administered 2018-01-03 (×2): 50 mg via INTRAVENOUS

## 2018-01-03 MED ORDER — HYDRALAZINE HCL 20 MG/ML IJ SOLN: 5.0000 mg | INTRAMUSCULAR | Status: DC | PRN

## 2018-01-03 MED ORDER — PROPOFOL 10 MG/ML IV BOLUS
INTRAVENOUS | Status: AC
  Filled 2018-01-03: qty 20

## 2018-01-03 MED ORDER — CEFAZOLIN SODIUM-DEXTROSE 2-4 GM/100ML-% IV SOLN
2.0000 g | INTRAVENOUS | Status: AC
  Administered 2018-01-03: 2 g via INTRAVENOUS
  Filled 2018-01-03: qty 100

## 2018-01-03 MED ORDER — HEMOSTATIC AGENTS (NO CHARGE) OPTIME
TOPICAL | Status: DC | PRN
  Administered 2018-01-03 (×2): 1 via TOPICAL

## 2018-01-03 MED ORDER — PANTOPRAZOLE SODIUM 40 MG PO TBEC
40.0000 mg | DELAYED_RELEASE_TABLET | Freq: Every day | ORAL | Status: DC
  Administered 2018-01-03 – 2018-01-04 (×2): 40 mg via ORAL
  Filled 2018-01-03 (×2): qty 1

## 2018-01-03 MED ORDER — HEPARIN SODIUM (PORCINE) 1000 UNIT/ML IJ SOLN
INTRAMUSCULAR | Status: DC | PRN
  Administered 2018-01-03: 8000 [IU] via INTRAVENOUS

## 2018-01-03 MED ORDER — MAGNESIUM SULFATE 2 GM/50ML IV SOLN: 2.0000 g | Freq: Every day | INTRAVENOUS | Status: DC | PRN

## 2018-01-03 MED ORDER — SODIUM CHLORIDE 0.9 % IV SOLN
INTRAVENOUS | Status: AC
  Filled 2018-01-03: qty 1.2

## 2018-01-03 MED ORDER — SODIUM CHLORIDE 0.9 % IV SOLN
0.0500 ug/kg/min | INTRAVENOUS | Status: AC
  Administered 2018-01-03: 0.1 ug/kg/min via INTRAVENOUS
  Filled 2018-01-03: qty 5000

## 2018-01-03 MED ORDER — SODIUM CHLORIDE 0.9 % IV SOLN
INTRAVENOUS | Status: DC | PRN
  Administered 2018-01-03: 40 ug/min via INTRAVENOUS

## 2018-01-03 MED ORDER — PHENYLEPHRINE 40 MCG/ML (10ML) SYRINGE FOR IV PUSH (FOR BLOOD PRESSURE SUPPORT)
PREFILLED_SYRINGE | INTRAVENOUS | Status: AC
  Filled 2018-01-03: qty 10

## 2018-01-03 MED ORDER — LIDOCAINE 2% (20 MG/ML) 5 ML SYRINGE
INTRAMUSCULAR | Status: DC | PRN
  Administered 2018-01-03: 80 mg via INTRAVENOUS

## 2018-01-03 MED ORDER — ROCURONIUM BROMIDE 50 MG/5ML IV SOSY
PREFILLED_SYRINGE | INTRAVENOUS | Status: AC
  Filled 2018-01-03: qty 5

## 2018-01-03 MED ORDER — GUAIFENESIN-DM 100-10 MG/5ML PO SYRP: 15.0000 mL | ORAL_SOLUTION | ORAL | Status: DC | PRN

## 2018-01-03 MED ORDER — ONDANSETRON HCL 4 MG/2ML IJ SOLN
INTRAMUSCULAR | Status: AC
  Filled 2018-01-03: qty 2

## 2018-01-03 MED ORDER — DOCUSATE SODIUM 100 MG PO CAPS
100.0000 mg | ORAL_CAPSULE | Freq: Every day | ORAL | Status: DC
  Administered 2018-01-04: 100 mg via ORAL
  Filled 2018-01-03: qty 1

## 2018-01-03 MED ORDER — HYDROMORPHONE HCL 1 MG/ML IJ SOLN
0.5000 mg | INTRAMUSCULAR | Status: DC | PRN
  Administered 2018-01-03: 0.5 mg via INTRAVENOUS
  Administered 2018-01-03: 1 mg via INTRAVENOUS
  Filled 2018-01-03 (×2): qty 1

## 2018-01-03 MED ORDER — LABETALOL HCL 5 MG/ML IV SOLN
10.0000 mg | INTRAVENOUS | Status: DC | PRN
  Administered 2018-01-03 (×3): 10 mg via INTRAVENOUS
  Filled 2018-01-03 (×2): qty 4

## 2018-01-03 MED ORDER — SUGAMMADEX SODIUM 200 MG/2ML IV SOLN
INTRAVENOUS | Status: DC | PRN
  Administered 2018-01-03: 100 mg via INTRAVENOUS

## 2018-01-03 MED ORDER — OXYCODONE HCL 5 MG PO TABS
5.0000 mg | ORAL_TABLET | Freq: Once | ORAL | Status: AC | PRN
  Administered 2018-01-03: 5 mg via ORAL

## 2018-01-03 MED ORDER — ARTIFICIAL TEARS OPHTHALMIC OINT
TOPICAL_OINTMENT | OPHTHALMIC | Status: AC
  Filled 2018-01-03: qty 3.5

## 2018-01-03 MED ORDER — PROTAMINE SULFATE 10 MG/ML IV SOLN
INTRAVENOUS | Status: DC | PRN
  Administered 2018-01-03: 50 mg via INTRAVENOUS

## 2018-01-03 MED ORDER — 0.9 % SODIUM CHLORIDE (POUR BTL) OPTIME
TOPICAL | Status: DC | PRN
  Administered 2018-01-03: 2000 mL

## 2018-01-03 MED ORDER — PHENYLEPHRINE 40 MCG/ML (10ML) SYRINGE FOR IV PUSH (FOR BLOOD PRESSURE SUPPORT)
PREFILLED_SYRINGE | INTRAVENOUS | Status: DC | PRN
  Administered 2018-01-03: 40 ug via INTRAVENOUS
  Administered 2018-01-03: 80 ug via INTRAVENOUS
  Administered 2018-01-03: 40 ug via INTRAVENOUS

## 2018-01-03 MED ORDER — LACTATED RINGERS IV SOLN
INTRAVENOUS | Status: DC | PRN
  Administered 2018-01-03: 07:00:00 via INTRAVENOUS

## 2018-01-03 MED ORDER — SODIUM CHLORIDE 0.9 % IV SOLN: INTRAVENOUS | Status: DC

## 2018-01-03 MED ORDER — ONDANSETRON HCL 4 MG/2ML IJ SOLN: 4.0000 mg | Freq: Once | INTRAMUSCULAR | Status: DC | PRN

## 2018-01-03 MED ORDER — FENTANYL CITRATE (PF) 250 MCG/5ML IJ SOLN
INTRAMUSCULAR | Status: AC
  Filled 2018-01-03: qty 5

## 2018-01-03 MED ORDER — DEXAMETHASONE SODIUM PHOSPHATE 10 MG/ML IJ SOLN
INTRAMUSCULAR | Status: AC
  Filled 2018-01-03: qty 1

## 2018-01-03 MED ORDER — SODIUM CHLORIDE 0.9 % IV SOLN: 500.0000 mL | Freq: Once | INTRAVENOUS | Status: DC | PRN

## 2018-01-03 MED ORDER — CHLORHEXIDINE GLUCONATE 4 % EX LIQD
60.0000 mL | Freq: Once | CUTANEOUS | Status: AC
  Administered 2018-01-03: 4 via TOPICAL
  Filled 2018-01-03: qty 60

## 2018-01-03 MED ORDER — FENTANYL CITRATE (PF) 100 MCG/2ML IJ SOLN
INTRAMUSCULAR | Status: DC | PRN
  Administered 2018-01-03 (×2): 50 ug via INTRAVENOUS

## 2018-01-03 MED ORDER — OXYCODONE HCL 5 MG PO TABS: 5.0000 mg | ORAL_TABLET | Freq: Four times a day (QID) | ORAL | 0 refills | Status: DC | PRN

## 2018-01-03 MED ORDER — CHLORHEXIDINE GLUCONATE 4 % EX LIQD
60.0000 mL | Freq: Once | CUTANEOUS | Status: DC
  Filled 2018-01-03: qty 60

## 2018-01-03 MED ORDER — PHENOL 1.4 % MT LIQD: 1.0000 | OROMUCOSAL | Status: DC | PRN

## 2018-01-03 MED ORDER — FENTANYL CITRATE (PF) 100 MCG/2ML IJ SOLN
25.0000 ug | INTRAMUSCULAR | Status: DC | PRN
  Administered 2018-01-03 (×3): 50 ug via INTRAVENOUS

## 2018-01-03 MED ORDER — PHENYLEPHRINE 40 MCG/ML (10ML) SYRINGE FOR IV PUSH (FOR BLOOD PRESSURE SUPPORT)
PREFILLED_SYRINGE | INTRAVENOUS | Status: AC
  Filled 2018-01-03: qty 20

## 2018-01-03 MED ORDER — MIDAZOLAM HCL 2 MG/2ML IJ SOLN
INTRAMUSCULAR | Status: AC
  Filled 2018-01-03: qty 2

## 2018-01-03 MED ORDER — ALUM & MAG HYDROXIDE-SIMETH 200-200-20 MG/5ML PO SUSP: 15.0000 mL | ORAL | Status: DC | PRN

## 2018-01-03 MED ORDER — ENOXAPARIN SODIUM 30 MG/0.3ML ~~LOC~~ SOLN: 30.0000 mg | SUBCUTANEOUS | Status: DC

## 2018-01-03 MED ORDER — SODIUM CHLORIDE 0.9 % IV SOLN
INTRAVENOUS | Status: DC | PRN
  Administered 2018-01-03: 07:00:00

## 2018-01-03 SURGICAL SUPPLY — 55 items
ADH SKN CLS APL DERMABOND .7 (GAUZE/BANDAGES/DRESSINGS) ×1
CANISTER SUCT 3000ML PPV (MISCELLANEOUS) ×3 IMPLANT
CATH ROBINSON RED A/P 18FR (CATHETERS) ×3 IMPLANT
CATH SUCT 10FR WHISTLE TIP (CATHETERS) ×3 IMPLANT
CLIP VESOCCLUDE MED 6/CT (CLIP) ×3 IMPLANT
CLIP VESOCCLUDE SM WIDE 6/CT (CLIP) ×3 IMPLANT
CONT SPEC 4OZ CLIKSEAL STRL BL (MISCELLANEOUS) ×3 IMPLANT
CORD BIPOLAR FORCEPS 12FT (ELECTRODE) ×2 IMPLANT
CRADLE DONUT ADULT HEAD (MISCELLANEOUS) ×3 IMPLANT
DERMABOND ADVANCED (GAUZE/BANDAGES/DRESSINGS) ×2
DERMABOND ADVANCED .7 DNX12 (GAUZE/BANDAGES/DRESSINGS) ×1 IMPLANT
DRAIN CHANNEL 15F RND FF W/TCR (WOUND CARE) ×2 IMPLANT
DRAPE INCISE IOBAN 66X45 STRL (DRAPES) ×2 IMPLANT
DRAPE LAPAROSCOPIC ABDOMINAL (DRAPES) ×2 IMPLANT
DRSG TEGADERM 2-3/8X2-3/4 SM (GAUZE/BANDAGES/DRESSINGS) ×2 IMPLANT
ELECT REM PT RETURN 9FT ADLT (ELECTROSURGICAL) ×3
ELECTRODE REM PT RTRN 9FT ADLT (ELECTROSURGICAL) ×1 IMPLANT
EVACUATOR SILICONE 100CC (DRAIN) ×2 IMPLANT
FORCEPS BIPOLAR SPETZLER 8 1.0 (NEUROSURGERY SUPPLIES) ×2 IMPLANT
GAUZE SPONGE 2X2 8PLY STRL LF (GAUZE/BANDAGES/DRESSINGS) IMPLANT
GLOVE BIO SURGEON STRL SZ 6.5 (GLOVE) ×3 IMPLANT
GLOVE BIO SURGEONS STRL SZ 6.5 (GLOVE) ×3
GLOVE BIOGEL PI IND STRL 6.5 (GLOVE) IMPLANT
GLOVE BIOGEL PI IND STRL 7.0 (GLOVE) IMPLANT
GLOVE BIOGEL PI IND STRL 7.5 (GLOVE) ×1 IMPLANT
GLOVE BIOGEL PI INDICATOR 6.5 (GLOVE) ×4
GLOVE BIOGEL PI INDICATOR 7.0 (GLOVE) ×2
GLOVE BIOGEL PI INDICATOR 7.5 (GLOVE) ×4
GLOVE ECLIPSE 7.0 STRL STRAW (GLOVE) ×2 IMPLANT
GLOVE SURG SS PI 6.5 STRL IVOR (GLOVE) ×4 IMPLANT
GLOVE SURG SS PI 7.5 STRL IVOR (GLOVE) ×3 IMPLANT
GOWN STRL REUS W/ TWL LRG LVL3 (GOWN DISPOSABLE) ×2 IMPLANT
GOWN STRL REUS W/ TWL XL LVL3 (GOWN DISPOSABLE) ×1 IMPLANT
GOWN STRL REUS W/TWL LRG LVL3 (GOWN DISPOSABLE) ×18
GOWN STRL REUS W/TWL XL LVL3 (GOWN DISPOSABLE) ×3
HEMOSTAT SNOW SURGICEL 2X4 (HEMOSTASIS) ×2 IMPLANT
KIT BASIN OR (CUSTOM PROCEDURE TRAY) ×3 IMPLANT
KIT TURNOVER KIT B (KITS) ×3 IMPLANT
NS IRRIG 1000ML POUR BTL (IV SOLUTION) ×9 IMPLANT
PACK CAROTID (CUSTOM PROCEDURE TRAY) ×3 IMPLANT
PAD ARMBOARD 7.5X6 YLW CONV (MISCELLANEOUS) ×6 IMPLANT
PATCH VASC XENOSURE 1CMX6CM (Vascular Products) ×3 IMPLANT
PATCH VASC XENOSURE 1X6 (Vascular Products) IMPLANT
POWDER SURGICEL 3.0 GRAM (HEMOSTASIS) ×2 IMPLANT
SPONGE GAUZE 2X2 STER 10/PKG (GAUZE/BANDAGES/DRESSINGS) ×2
SUT ETHILON 3 0 PS 1 (SUTURE) ×2 IMPLANT
SUT PROLENE 6 0 BV (SUTURE) ×5 IMPLANT
SUT PROLENE 7 0 BV1 MDA (SUTURE) ×2 IMPLANT
SUT SILK 3 0 (SUTURE) ×3
SUT SILK 3-0 18XBRD TIE 12 (SUTURE) IMPLANT
SUT VIC AB 3-0 SH 27 (SUTURE) ×6
SUT VIC AB 3-0 SH 27X BRD (SUTURE) ×2 IMPLANT
SUT VICRYL 4-0 PS2 18IN ABS (SUTURE) ×3 IMPLANT
TOWEL GREEN STERILE (TOWEL DISPOSABLE) ×3 IMPLANT
WATER STERILE IRR 1000ML POUR (IV SOLUTION) ×3 IMPLANT

## 2018-01-03 NOTE — Consult Note (Signed)
            [facility] CM Primary Care Navigator  01/03/2018  Ashley Doyle 1945/08/31 [id number]   Went to see patient at the bedside to identify possible discharge needs but she is off the unit in the OR for surgery. (Tumor excision carotid body left, Endarterectomy carotid left, left carotid artery patch angioplasty)  Per MD note, patient was admitted forfollow-up of her carotid disease and left carotid body tumor.  Will attempt to see patient at another time when she is available in the room.    Addendum (01/06/18):  Went back to see patient at the bedside to identify possible discharge needs but she was already discharged home over the weekend.  Per MD note, patient was much improved after status post left carotid body tumor excision as well as left carotid endarterectomy with high-grade stenosis of her right.  Patient has discharge instruction to follow-up with vascular surgery- office will call patient in 2 weeks for follow-up appointment.    For additional questions please contact:  Edwena Felty A. Timica Marcom, BSN, RN-BC [facility] PRIMARY CARE Navigator Cell: [phone]

## 2018-01-03 NOTE — H&P (Signed)
[facility] [place]  Patient name: Ashley Doyle           MRN: [id number]        DOB: 09/20/1945        Sex: female   REASON FOR VISIT:    Follow up  HISOTRY OF PRESENT ILLNESS:    Ashley Doyle a 72 y.o.female, who returns for follow up of her carotid disease and left carotid body tumor. The patient has a history of stroke secondary to hypertension approximately 10 years ago which have left her with some right-sided weakness and occasional swallowing difficulty and trouble talking. She recently had an ultrasound which showed bilateral high-grade carotid lesions and a possible left carotid body tumor. She denies any acute symptoms.  The patient has been diagnosed with diabetes in the past however she is not on medication and her A1c is 5.6 and she no longer carries that diagnosis. She has a history of COPD secondary to significant tobacco abuse. She is medically managed for hypertension which is under good control. She takes a statin for hypercholesterolemia.  I sent her for a CT scan.  SHe is here to review the results   PAST MEDICAL HISTORY:       Past Medical History:  Diagnosis Date  . Anxiety   . Arthritis   . Asthma    not on inhalers now- no flares recently  . Breast cancer ([place])   . Chronic kidney disease    "as a child" "severe infection and it was toxic"  . Constipation   . COPD (chronic obstructive pulmonary disease) ([place])   . Depression   . Dyspnea    with exertion  . Emphysema of lung ([place])   . Family history of adverse reaction to anesthesia    sister has PONV  . GERD (gastroesophageal reflux disease)   . Headache    history of migraines- none since hysterectomy- has bad headaches at times  . HOH (hard of hearing)   . Hypertension   . Invasive ductal carcinoma of breast, female, right ([place]) 03/07/2016  . Peripheral vascular disease ([place])   . Pinched nerve in neck      and back  . Sciatica   . Stroke [facility])    2 - right sided weakness- right leg - "speech expressive aphasia when nervous"  . Tuberculosis    "several years ago" 04/11/16  . Wears glasses   . Wears hearing aid    left ear     FAMILY HISTORY:        Family History  Problem Relation Age of Onset  . Stroke Mother   . Heart disease Mother   . Lung cancer Father     SOCIAL HISTORY:   Social History        Tobacco Use  . Smoking status: Current Every Day Smoker    Years: 54.00    Types: Cigarettes  . Smokeless tobacco: Never Used  . Tobacco comment: 33-35 per day  Substance Use Topics  . Alcohol use: No     ALLERGIES:       Allergies  Allergen Reactions  . No Known Allergies      CURRENT MEDICATIONS:         Current Outpatient Medications  Medication Sig Dispense Refill  . buPROPion (WELLBUTRIN XL) 150 MG 24 hr tablet Take 150 mg by mouth daily.    . citalopram (CELEXA) 40 MG tablet Take 40 mg by mouth daily.  4  . docusate sodium (COLACE) 100 MG capsule Take 100 mg by mouth daily as needed for mild constipation.    . gabapentin (NEURONTIN) 300 MG capsule Take 1 capsule (300 mg total) by mouth 3 (three) times daily. 90 capsule 2  . LORazepam (ATIVAN) 0.5 MG tablet Take 1 tablet (0.5 mg total) by mouth every 8 (eight) hours. (Patient taking differently: Take 0.5 mg by mouth every 8 (eight) hours as needed for anxiety. ) 30 tablet 0  . methocarbamol (ROBAXIN) 750 MG tablet Take 1 tablet (750 mg total) by mouth every 8 (eight) hours as needed (use for muscle cramps/pain). 20 tablet 0  . Mirabegron (MYRBETRIQ PO) Take by mouth.    Marland Kitchen omeprazole (PRILOSEC) 20 MG capsule TAKE 1 CAPSULE BY MOUTH EVERY DAY 30 capsule 0  . oxyCODONE (OXY IR/ROXICODONE) 5 MG immediate release tablet Take 1 tablet (5 mg total) by mouth every 6 (six) hours as needed for moderate pain, severe pain or breakthrough pain. 20 tablet 0  .  triamterene-hydrochlorothiazide (MAXZIDE-25) 37.5-25 MG tablet Take 1 tablet by mouth daily.  1   No current facility-administered medications for this visit.     REVIEW OF SYSTEMS:   [X]  denotes positive finding, [ ]  denotes negative finding Cardiac  Comments:  Chest pain or chest pressure:    Shortness of breath upon exertion:    Short of breath when lying flat:    Irregular heart rhythm:        Vascular    Pain in calf, thigh, or hip brought on by ambulation:    Pain in feet at night that wakes you up from your sleep:     Blood clot in your veins:    Leg swelling:         Pulmonary    Oxygen at home:    Productive cough:     Wheezing:         Neurologic    Sudden weakness in arms or legs:     Sudden numbness in arms or legs:     Sudden onset of difficulty speaking or slurred speech:    Temporary loss of vision in one eye:     Problems with dizziness:         Gastrointestinal    Blood in stool:     Vomited blood:         Genitourinary    Burning when urinating:     Blood in urine:        Psychiatric    Major depression:         Hematologic    Bleeding problems:    Problems with blood clotting too easily:        Skin    Rashes or ulcers:        Constitutional    Fever or chills:      PHYSICAL EXAM:       Vitals:   11/18/17 0831 11/18/17 0833  BP: (!) 154/76 (!) 153/89  Pulse: 77   Resp: 18   SpO2: 98%   Weight: 167 lb (75.8 kg)   Height: 5\' 2"  (1.575 m)     GENERAL: The patient is a well-nourished female, in no acute distress. The vital signs are documented above. CARDIAC: There is a regular rate and rhythm.  PULMONARY: Non-labored respirations  MUSCULOSKELETAL: There are no major deformities or cyanosis. NEUROLOGIC: No focal weakness or paresthesias are detected. SKIN: There are no ulcers or rashes noted. PSYCHIATRIC: The patient  has a normal affect.  STUDIES:   I have ordered and reviewed her CT scan with the following results: 1. 75% proximal right ICA stenosis. 2. 70% proximal left ICA stenosis. 3. 2.3 cm hypervascular mass at the left carotid bifurcation consistent with a carotid body tumor. 4. Moderate bilateral vertebral artery origin stenosis. 5. Aortic Atherosclerosis (ICD10-I70.0) and Emphysema (ICD10-J43.9).   MEDICAL ISSUES:   Carotid disease: The patient is unsure whether or not she is having recurrent symptoms.  She says she will occasionally have short episodes of slurred speech.  She will also get some pain in her right jaw which she states is similar to when she had her stroke many years ago.  I reviewed the patient's CT scan with her husband and the patient.  We have decided to proceed with removal of her left carotid body tumor and simultaneous left carotid endarterectomy.  I discussed the risk benefits the operation including the risk of stroke and nerve injury.  I will have her get embolization of her carotid body tumor the day prior to her surgery.  All the questions were answered.  This will be scheduled for August.  We will consider elective right carotid endarterectomy when she recovers from the left side.  I will get cardiology clearance.    Annamarie Major, MD [facility] [phone] Pager [phone]    Tolerated embolization yesterday CV:RRR Right groin soft PULM:CTA  Plan left carotid body tumor resection and carotid endarterectomy Risks and benefits discussed.  Annamarie Major

## 2018-01-03 NOTE — OR Nursing (Signed)
Bleeding from right ear noted at case end. Dr.Brabham called and he came to OR 11 and assessed patient condition.

## 2018-01-03 NOTE — Telephone Encounter (Signed)
sch appt spk to pt lvm mld ltr 01/27/18 345pm p/o MD

## 2018-01-03 NOTE — Discharge Instructions (Signed)
   [facility]  Discharge Instructions   Carotid Endarterectomy (CEA)  Please refer to the following instructions for your post-procedure care. Your surgeon or physician assistant will discuss any changes with you.  Activity  You are encouraged to walk as much as you can. You can slowly return to normal activities but must avoid strenuous activity and heavy lifting until your doctor tell you it's OK. Avoid activities such as vacuuming or swinging a golf club. You can drive after one week if you are comfortable and you are no longer taking prescription pain medications. It is normal to feel tired for serval weeks after your surgery. It is also normal to have difficulty with sleep habits, eating, and bowel movements after surgery. These will go away with time.  Bathing/Showering  You may shower after you come home. Do not soak in a bathtub, hot tub, or swim until the incision heals completely.  Incision Care  Shower every day. Clean your incision with mild soap and water. Pat the area dry with a clean towel. You do not need a bandage unless otherwise instructed. Do not apply any ointments or creams to your incision. You may have skin glue on your incision. Do not peel it off. It will come off on its own in about one week. Your incision may feel thickened and raised for several weeks after your surgery. This is normal and the skin will soften over time. For Men Only: It's OK to shave around the incision but do not shave the incision itself for 2 weeks. It is common to have numbness under your chin that could last for several months.  Diet  Resume your normal diet. There are no special food restrictions following this procedure. A low fat/low cholesterol diet is recommended for all patients with vascular disease. In order to heal from your surgery, it is CRITICAL to get adequate nutrition. Your body requires vitamins, minerals, and protein. Vegetables are the best  source of vitamins and minerals. Vegetables also provide the perfect balance of protein. Processed food has little nutritional value, so try to avoid this.        Medications  Resume taking all of your medications unless your doctor or physician assistant tells you not to. If your incision is causing pain, you may take over-the- counter pain relievers such as acetaminophen (Tylenol). If you were prescribed a stronger pain medication, please be aware these medications can cause nausea and constipation. Prevent nausea by taking the medication with a snack or meal. Avoid constipation by drinking plenty of fluids and eating foods with a high amount of fiber, such as fruits, vegetables, and grains. Do not take Tylenol if you are taking prescription pain medications.  Follow Up  Our office will schedule a follow up appointment 2-3 weeks following discharge.  Please call us immediately for any of the following conditions  Increased pain, redness, drainage (pus) from your incision site. Fever of 101 degrees or higher. If you should develop stroke (slurred speech, difficulty swallowing, weakness on one side of your body, loss of vision) you should call 911 and go to the nearest emergency room.  Reduce your risk of vascular disease:  Stop smoking. If you would like help call [facility] at 1-800-QUIT-NOW (1-800-784-8669) or  at 336-586-4000. Manage your cholesterol Maintain a desired weight Control your diabetes Keep your blood pressure down  If you have any questions, please call the office at [phone].   

## 2018-01-03 NOTE — Transfer of Care (Signed)
Immediate Anesthesia Transfer of Care Note  Patient: SHONTA BOURQUE  Procedure(s) Performed: TUMOR EXCISION CAROTID BODY LEFT (Left Neck) ENDARTERECTOMY CAROTID LEFT (Left Neck) LEFT CAROTID ARTERY PATCH ANGIOPLASTY USING XENOSURE BIOLOGIC PATCH (Left Neck)  Patient Location: PACU  Anesthesia Type:General  Level of Consciousness: awake and patient cooperative  Airway & Oxygen Therapy: Patient Spontanous Breathing and Patient connected to face mask oxygen  Post-op Assessment: Report given to RN, Post -op Vital signs reviewed and stable and Patient moving all extremities  Post vital signs: Reviewed and stable  Last Vitals:  Vitals Value Taken Time  BP 184/84 01/03/2018 10:50 AM  Temp    Pulse 84 01/03/2018 10:53 AM  Resp 24 01/03/2018 10:53 AM  SpO2 100 % 01/03/2018 10:53 AM  Vitals shown include unvalidated device data.  Last Pain:  Vitals:   01/03/18 0400  TempSrc: Oral  PainSc: Asleep         Complications: No apparent anesthesia complications

## 2018-01-03 NOTE — Op Note (Signed)
Patient name: Ashley Doyle MRN: [id number] DOB: 1946/01/18 Sex: female  01/03/2018 Pre-operative Diagnosis: #1: Asymptomatic left carotid stenosis     #2: Left carotid body tumor Post-operative diagnosis:  Same Surgeon:  Annamarie Major Assistants: Laurence Slate, PA Procedure:   #1: left carotid Endarterectomy with bovine pericardial patch angioplasty   #2: Resection of left carotid body tumor Anesthesia:  General Blood Loss: 200 cc Specimens:  Carotid body tumor to pathology  Findings:  70 %stenosis; Thrombus:  none  Indications: The patient was found to have bilateral moderate to severe carotid stenosis.  CT scan imaging also identified a 2.3 cm hypervascular mass consistent with a carotid body tumor.  We discussed proceeding with simultaneous removal of the left carotid body tumor as well as left carotid endarterectomy.  Preoperatively, she had embolization of inferior branches to her carotid body tumor.  Procedure:  The patient was identified in the holding area and taken to [facility]  The patient was then placed supine on the table.   General endotrachial anesthesia was administered.  The patient was prepped and draped in the usual sterile fashion.  A time out was called and antibiotics were administered.  The incision was made along the anterior border of the left sternocleidomastoid muscle.  Cautery was used to dissect through the subcutaneous tissue.  The platysma muscle was divided with cautery.  The internal jugular vein was exposed along its anterior medial border.  The common facial vein was exposed and then divided between 2-0 silk ties and metal clips.  The common carotid artery was then circumferentially exposed and encircled with an umbilical tape.  The vagus nerve was identified and protected.  I then identified the soft tissue mass at the carotid bifurcation.  I began to use cautery and bipolar dissection to remove the mass.  This was hypervascular and still bled upon  manipulation.  I visualize the hypoglossal nerve and had to mobilize it in order to adequately visualize the tumor.  I then proceeded with skeletonization of the tumor with Bovie cautery and sharp dissection.  Ultimately, the tumor was able to be completely removed.  It was sent to pathology for final evaluation.  I then obtained hemostasis from the tissue bed from the tumor.  In order to adequately remove the tumor, I ligated the superior thyroid artery.  At this point, I had basically skeletonized the external carotid artery.  I then dissected out the distal internal carotid artery and encircled it with an umbilical tape.  The patient was then fully heparinized.  A bovine carotid patch was selected and prepared on the back table.  A 10 french shunt was also prepared.  After blood pressure readings were appropriate and the heparin had been given time to circulate, the internal carotid artery was occluded with a baby Gregory clamp.  The external and common carotid arteries were then occluded with vascular clamps..  A #11 blade was used to make an arteriotomy in the common carotid artery.  This was extended with Potts scissors along the anterior and lateral border of the common and internal carotid artery.  Approximately 70% stenosis was identified.  There was no thrombus identified.  The 10 french shunt was mot placed, as there was adequate backbleeding.  A kleiner kuntz elevator was used to perform endarterectomy.  An eversion endarterectomy was performed in the external carotid artery.  A good distal endpoint was obtained in the internal carotid artery.  The specimen was removed.  Heparinized  saline was used to irrigate the endarterectomized field.  All potential embolic debris was removed.  Bovine pericardial patch angioplasty was then performed using a running 6-0 Prolene. . The common internal and external carotid arteries were all appropriately flushed. The artery was again irrigated with heparin saline.  The  anastomosis was then secured. The clamp was first released on the external carotid artery followed by the common carotid artery approximately 30 seconds later, bloodflow was reestablish through the internal carotid artery.  Next, a hand-held  Doppler was used to evaluate the signals in the common, external, and internal  carotid arteries, all of which had appropriate signals. I then administered  50 mg protamine. The wound was then irrigated.  After hemostasis was achieved, the carotid sheath was reapproximated with 3-0 Vicryl. The  platysma muscle was reapproximated with running 3-0 Vicryl. The skin  was closed with 4-0 Vicryl. Dermabond was placed on the skin. The  patient was then successfully extubated. Her neurologic exam was  similar to his preprocedural exam. The patient was then taken to recovery room  in stable condition. There were no complications.     Disposition:  To PACU in stable condition.  Relevant Operative Details: A hypervascular mass measuring approximately 2.3 cm was removed from the carotid bifurcation.  This was consistent with a carotid body tumor.  There were no surrounding lymph nodes.  Once the mass was removed, I proceeded with carotid endarterectomy.  A shunt was not utilized as there was excellent backbleeding.  A bovine pericardial patch was placed.  There was approximately 70% carotid stenosis.  Theotis Burrow, M.D. [facility] Office: [phone] Pager:  [phone]

## 2018-01-03 NOTE — Anesthesia Procedure Notes (Signed)
Procedure Name: Intubation Date/Time: 01/03/2018 7:51 AM Performed by: Moshe Salisbury, CRNA Pre-anesthesia Checklist: Patient identified, Emergency Drugs available, Suction available, Patient being monitored and Timeout performed Patient Re-evaluated:Patient Re-evaluated prior to induction Oxygen Delivery Method: Circle system utilized Preoxygenation: Pre-oxygenation with 100% oxygen Induction Type: IV induction Ventilation: Mask ventilation without difficulty and Oral airway inserted - appropriate to patient size Laryngoscope Size: Mac and 3 Grade View: Grade I Tube type: Oral Tube size: 7.0 mm Number of attempts: 1 Airway Equipment and Method: Oral airway and Stylet Placement Confirmation: ETT inserted through vocal cords under direct vision,  positive ETCO2,  CO2 detector and breath sounds checked- equal and bilateral Secured at: 22 cm Tube secured with: Tape Dental Injury: Teeth and Oropharynx as per pre-operative assessment  Comments: Intubated by Violet Baldy

## 2018-01-03 NOTE — Progress Notes (Signed)
Patient seen and examined in the PACU.  She is status post resection of left carotid body tumor and left carotid endarterectomy.  She is neurologically intact.  There is minimal output in her drain.  She is stable to transfer to the floor  [employer]

## 2018-01-03 NOTE — Anesthesia Postprocedure Evaluation (Signed)
Anesthesia Post Note  Patient: Ashley Doyle  Procedure(s) Performed: TUMOR EXCISION CAROTID BODY LEFT (Left Neck) ENDARTERECTOMY CAROTID LEFT (Left Neck) LEFT CAROTID ARTERY PATCH ANGIOPLASTY USING XENOSURE BIOLOGIC PATCH (Left Neck)     Patient location during evaluation: PACU Anesthesia Type: General Level of consciousness: awake and alert Pain management: pain level controlled Vital Signs Assessment: post-procedure vital signs reviewed and stable Respiratory status: spontaneous breathing, nonlabored ventilation, respiratory function stable and patient connected to nasal cannula oxygen Cardiovascular status: blood pressure returned to baseline and stable Postop Assessment: no apparent nausea or vomiting Anesthetic complications: no    Last Vitals:  Vitals:   01/03/18 1145 01/03/18 1150  BP:  136/65  Pulse: 77 77  Resp: (!) 23 19  Temp:  36.9 C  SpO2: 95% 95%    Last Pain:  Vitals:   01/03/18 1200  TempSrc:   PainSc: 6     LLE Motor Response: Purposeful movement (01/03/18 1245)   RLE Motor Response: Purposeful movement (01/03/18 1245)        Audry Pili

## 2018-01-03 NOTE — Anesthesia Procedure Notes (Signed)
Arterial Line Insertion Start/End8/23/2019 7:00 AM, 01/03/2018 7:15 AM Performed by: Moshe Salisbury, CRNA, CRNA  Preanesthetic checklist: patient identified, IV checked, site marked, risks and benefits discussed, surgical consent, monitors and equipment checked, pre-op evaluation and timeout performed Lidocaine 1% used for infiltration and patient sedated Left, radial was placed Catheter size: 20 G Hand hygiene performed , maximum sterile barriers used  and Seldinger technique used Allen's test indicative of satisfactory collateral circulation Attempts: 1 Procedure performed without using ultrasound guided technique. Following insertion, Biopatch and dressing applied. Post procedure assessment: normal  Patient tolerated the procedure well with no immediate complications.

## 2018-01-04 LAB — CBC
HCT: 37.3 % (ref 36.0–46.0)
Hemoglobin: 12.3 g/dL (ref 12.0–15.0)
MCH: 31.2 pg (ref 26.0–34.0)
MCHC: 33 g/dL (ref 30.0–36.0)
MCV: 94.7 fL (ref 78.0–100.0)
PLATELETS: 146 10*3/uL — AB (ref 150–400)
RBC: 3.94 MIL/uL (ref 3.87–5.11)
RDW: 13.8 % (ref 11.5–15.5)
WBC: 10.1 10*3/uL (ref 4.0–10.5)

## 2018-01-04 LAB — BASIC METABOLIC PANEL
Anion gap: 5 (ref 5–15)
BUN: 11 mg/dL (ref 8–23)
CO2: 27 mmol/L (ref 22–32)
CREATININE: 0.78 mg/dL (ref 0.44–1.00)
Calcium: 8.6 mg/dL — ABNORMAL LOW (ref 8.9–10.3)
Chloride: 106 mmol/L (ref 98–111)
GFR calc Af Amer: >60 mL/min (ref >60)
Glucose, Bld: 109 mg/dL — ABNORMAL HIGH (ref 70–99)
Potassium: 3.8 mmol/L (ref 3.5–5.1)
SODIUM: 138 mmol/L (ref 135–145)

## 2018-01-04 NOTE — Progress Notes (Signed)
D/c instructions given to pt and husband. Prescription given. JP drain removed per order. IVs removed, clean and intact. All questions answered regarding d/c and wound care. Husband to escort home.  Clyde Canterbury, RN

## 2018-01-04 NOTE — Progress Notes (Signed)
  Progress Note    01/04/2018 9:48 AM 1 Day Post-Op  Subjective: Doing well this morning voice is much improved  Vitals:   01/04/18 0818 01/04/18 0846  BP: (!) 165/71   Pulse: 74 75  Resp: 18   Temp: 98.1 F (36.7 C)   SpO2: 94%     Physical Exam: Awake alert oriented Neurologically intact moving all extremities Left neck with minimal hematoma Drain is in place his drain 15 cc overnight  CBC    Component Value Date/Time   WBC 10.1 01/04/2018 0511   RBC 3.94 01/04/2018 0511   HGB 12.3 01/04/2018 0511   HCT 37.3 01/04/2018 0511   PLT 146 (L) 01/04/2018 0511   MCV 94.7 01/04/2018 0511   MCH 31.2 01/04/2018 0511   MCHC 33.0 01/04/2018 0511   RDW 13.8 01/04/2018 0511   LYMPHSABS 1.1 01/03/2018 0338   MONOABS 0.5 01/03/2018 0338   EOSABS 0.0 01/03/2018 0338   BASOSABS 0.0 01/03/2018 0338    BMET    Component Value Date/Time   NA 138 01/04/2018 0511   K 3.8 01/04/2018 0511   CL 106 01/04/2018 0511   CO2 27 01/04/2018 0511   GLUCOSE 109 (H) 01/04/2018 0511   BUN 11 01/04/2018 0511   CREATININE 0.78 01/04/2018 0511   CALCIUM 8.6 (L) 01/04/2018 0511   GFRNONAA >60 01/04/2018 0511   GFRAA >60 01/04/2018 0511    INR    Component Value Date/Time   INR 1.06 01/03/2018 0338     Intake/Output Summary (Last 24 hours) at 01/04/2018 0948 Last data filed at 01/04/2018 0842 Gross per 24 hour  Intake 2430.28 ml  Output 1320 ml  Net 1110.28 ml     Assessment/plan:  72 y.o. female is s/p left carotid body tumor excision as well as left carotid endarterectomy with high-grade stenosis of her right.  She is neurologically intact and is safe for discharge.  Will remove drain today plan for 3 to 4-week follow-up with Dr. Trula Slade.   Erlene Quan C. Donzetta Matters, MD [facility] Office: [phone] Pager: [phone]  01/04/2018 9:48 AM

## 2018-01-06 ENCOUNTER — Encounter (HOSPITAL_COMMUNITY): Payer: Self-pay | Admitting: Surgery

## 2018-01-06 NOTE — Discharge Summary (Signed)
[facility] Discharge Summary   Patient ID:  Ashley Doyle MRN: [id number] DOB/AGE: [birth year] 72 y.o.  Admit date: 01/02/2018 Discharge date: 01/04/2018 Date of Surgery: 01/03/2018 Surgeon: Surgeon(s): Serafina Mitchell, MD  Admission Diagnosis: Carotid stenosis [I65.29]  Discharge Diagnoses:  Carotid stenosis [I65.29]  Secondary Diagnoses: Past Medical History:  Diagnosis Date  . Anxiety   . Arthritis   . Asthma    not on inhalers now- no flares recently  . Breast cancer ([place])   . Chronic kidney disease    "as a child" "severe infection and it was toxic"  . Constipation   . COPD (chronic obstructive pulmonary disease) ([place])   . Depression   . Dyspnea    with exertion  . Emphysema of lung ([place])   . Family history of adverse reaction to anesthesia    sister has PONV  . GERD (gastroesophageal reflux disease)   . Headache    history of migraines- none since hysterectomy- has bad headaches at times  . History of chemotherapy   . HOH (hard of hearing)   . Hypertension   . Invasive ductal carcinoma of breast, female, right ([place]) 03/07/2016  . Peripheral vascular disease ([place])   . Pinched nerve in neck    and back  . Sciatica   . Stroke [facility])    2 - right sided weakness- right leg - "speech expressive aphasia when nervous"  . Tuberculosis    "several years ago" 04/11/16  . Wears glasses   . Wears hearing aid    left ear    Procedure(s): TUMOR EXCISION CAROTID BODY LEFT ENDARTERECTOMY CAROTID LEFT LEFT CAROTID ARTERY PATCH ANGIOPLASTY USING XENOSURE BIOLOGIC PATCH  Discharged Condition: stable  HPI: Ashley Doyle a 72 y.o.female, whoreturns for follow up of her carotid disease and left carotid body tumor.The patient has a history of stroke secondary to hypertension approximately 10 years ago which have left her with some right-sided weakness and occasional swallowing difficulty and trouble talking. She recently had an ultrasound which  showed bilateral high-grade carotid lesions and a possible left carotid body tumor.              She had a procedure today with IR Dr. Estanislado Pandy : S/P bilateral common carotid,bilateral vertebral arteriograms ,and Lt ECA arteriogram with superselective embolization of x3 prominent arterial feeders arising from the ascending pharyngeal A using PVA particles sizes 250 to 500 microns and 500 to 700 microns with approx 80 % devascularization.             Dr.Brabham has surgery planed tomorrow for removal of her left carotid body tumor and simultaneous left carotid endarterectomy.    Hospital Course:  Ashley Doyle is a 72 y.o. female is S/P  Procedure(s): TUMOR EXCISION CAROTID BODY LEFT ENDARTERECTOMY CAROTID LEFT LEFT CAROTID ARTERY PATCH ANGIOPLASTY USING XENOSURE BIOLOGIC PATCH  Post op was uneventful.  She has no neurologic deficits.  JP drain was removed at bedside and she was discharged home is stable condition.   Specimens:  Carotid body tumor to pathology  Significant Diagnostic Studies: CBC Lab Results  Component Value Date   WBC 10.1 01/04/2018   HGB 12.3 01/04/2018   HCT 37.3 01/04/2018   MCV 94.7 01/04/2018   PLT 146 (L) 01/04/2018    BMET    Component Value Date/Time   NA 138 01/04/2018 0511   K 3.8 01/04/2018 0511   CL 106 01/04/2018 0511   CO2 27 01/04/2018 0511  GLUCOSE 109 (H) 01/04/2018 0511   BUN 11 01/04/2018 0511   CREATININE 0.78 01/04/2018 0511   CALCIUM 8.6 (L) 01/04/2018 0511   GFRNONAA >60 01/04/2018 0511   GFRAA >60 01/04/2018 0511   COAG Lab Results  Component Value Date   INR 1.06 01/03/2018   INR 1.04 12/25/2017     Disposition:  Discharge to :Home Discharge Instructions    Call MD for:  redness, tenderness, or signs of infection (pain, swelling, bleeding, redness, odor or green/yellow discharge around incision site)   Complete by:  As directed    Call MD for:  redness, tenderness, or signs of infection (pain, swelling, bleeding,  redness, odor or green/yellow discharge around incision site)   Complete by:  As directed    Call MD for:  severe or increased pain, loss or decreased feeling  in affected limb(s)   Complete by:  As directed    Call MD for:  severe or increased pain, loss or decreased feeling  in affected limb(s)   Complete by:  As directed    Call MD for:  temperature >100.5   Complete by:  As directed    Call MD for:  temperature >100.5   Complete by:  As directed    Resume previous diet   Complete by:  As directed    Resume previous diet   Complete by:  As directed      Allergies as of 01/04/2018      Reactions   No Known Allergies       Medication List    TAKE these medications   aspirin EC 81 MG tablet Take 1 tablet (81 mg total) by mouth daily.   atorvastatin 40 MG tablet Commonly known as:  LIPITOR Take 1 tablet (40 mg total) by mouth daily.   buPROPion 150 MG 24 hr tablet Commonly known as:  WELLBUTRIN XL Take 150 mg by mouth daily.   citalopram 40 MG tablet Commonly known as:  CELEXA Take 40 mg by mouth daily.   docusate sodium 100 MG capsule Commonly known as:  COLACE Take 100 mg by mouth daily as needed for mild constipation.   gabapentin 300 MG capsule Commonly known as:  NEURONTIN Take 1 capsule (300 mg total) by mouth 3 (three) times daily.   LORazepam 0.5 MG tablet Commonly known as:  ATIVAN Take 1 tablet (0.5 mg total) by mouth every 8 (eight) hours. What changed:    when to take this  reasons to take this   methocarbamol 750 MG tablet Commonly known as:  ROBAXIN Take 1 tablet (750 mg total) by mouth every 8 (eight) hours as needed (use for muscle cramps/pain).   metoprolol tartrate 25 MG tablet Commonly known as:  LOPRESSOR Take 1 tablet (25 mg total) by mouth 2 (two) times daily.   omeprazole 20 MG capsule Commonly known as:  PRILOSEC TAKE 1 CAPSULE BY MOUTH EVERY DAY What changed:  how much to take   oxyCODONE 5 MG immediate release  tablet Commonly known as:  Oxy IR/ROXICODONE Take 1 tablet (5 mg total) by mouth every 6 (six) hours as needed for moderate pain, severe pain or breakthrough pain.   triamterene-hydrochlorothiazide 37.5-25 MG tablet Commonly known as:  MAXZIDE-25 Take 1 tablet by mouth daily.      Verbal and written Discharge instructions given to the patient. Wound care per Discharge AVS Follow-up Information    Serafina Mitchell, MD In 2 weeks.   Specialties:  Vascular Surgery, Cardiology Why:  Office will call you to arrange your appt (sent) Contact information: [place] [address] [phone]           Signed: Roxy Horseman 01/06/2018, 3:37 PM --- For VQI Registry use --- Instructions: Press F2 to tab through selections.  Delete question if not applicable.   Modified Rankin score at D/C (0-6): Rankin Score=0  IV medication needed for:  1. Hypertension: No 2. Hypotension: No  Post-op Complications: No  1. Post-op CVA or TIA: No  If yes: Event classification (right eye, left eye, right cortical, left cortical, verterobasilar, other):   If yes: Timing of event (intra-op, <6 hrs post-op, >=6 hrs post-op, unknown):   2. CN injury: No  If yes: CN  injuried   3. Myocardial infarction: No  If yes: Dx by (EKG or clinical, Troponin):   4.  CHF: No  5.  Dysrhythmia (new): No  6. Wound infection: No  7. Reperfusion symptoms: No  8. Return to OR: No  If yes: return to OR for (bleeding, neurologic, other CEA incision, other):   Discharge medications: Statin use:  Yes ASA use:  Yes Beta blocker use:  Yes ACE-Inhibitor use:  No  for medical reason   P2Y12 Antagonist use: [x ] None, [ ]  Plavix, [ ]  Plasugrel, [ ]  Ticlopinine, [ ]  Ticagrelor, [ ]  Other, [ ]  No for medical reason, [ ]  Non-compliant, [ ]  Not-indicated Anti-coagulant use:  [x ] None, [ ]  Warfarin, [ ]  Rivaroxaban, [ ]  Dabigatran, [ ]  Other, [ ]  No for medical reason, [ ]  Non-compliant, [ ]   Not-indicated

## 2018-01-07 ENCOUNTER — Telehealth: Payer: Self-pay | Admitting: Surgery

## 2018-01-08 ENCOUNTER — Other Ambulatory Visit: Payer: Self-pay

## 2018-01-08 DIAGNOSIS — I6523 Occlusion and stenosis of bilateral carotid arteries: Secondary | ICD-10-CM

## 2018-01-10 ENCOUNTER — Other Ambulatory Visit: Payer: Self-pay | Admitting: *Deleted

## 2018-01-10 NOTE — Patient Outreach (Signed)
Transiton of care call attempted however, there was no answer. I left a voice mail and requested a return call or I advised that I will be calling on Tuesday,  [place]. Myrtie Neither, MSN, [facility] Gerontological Nurse Practitioner [facility] Care Management [phone]

## 2018-01-14 ENCOUNTER — Telehealth (HOSPITAL_COMMUNITY): Payer: Self-pay | Admitting: *Deleted

## 2018-01-14 ENCOUNTER — Other Ambulatory Visit: Payer: Self-pay | Admitting: *Deleted

## 2018-01-14 NOTE — Patient Outreach (Addendum)
Transition of care attempt #2 made. I will see if pt returns my call or not and call one more time and then send unsuccessful letter if I do not reach her. I have looked for additional phone number for her, her husband and there are none listed.  Ashley Doyle. Myrtie Neither, MSN, [facility] Gerontological Nurse Practitioner [facility] Care Management [phone]

## 2018-01-15 ENCOUNTER — Encounter: Payer: Self-pay | Admitting: *Deleted

## 2018-01-15 ENCOUNTER — Other Ambulatory Visit: Payer: Self-pay | Admitting: *Deleted

## 2018-01-15 NOTE — Patient Outreach (Addendum)
Transition of care referral received from [facility] on 01/07/18. Pt discharged from [facility] on 01/04/18.She had 2 carotid artery surgeries. She is doing fine today. She reports her incision is sore and her lower jaw. She has taken her pain medication as prescribed. She asked me if I could tell what the results of her biopsy are. I was able to tell that the biopsy was completed but no results. I advised her to call her surgeon tomorrow to see if she could get the results now instead of waiting until 01/27/18. I advised that I will be checking on her and she was in agreement with this.  I will send her a successful letter and call her again in a week.  Allergies as of 01/15/2018      Reactions   No Known Allergies       Medication List        Accurate as of 01/15/18  5:29 PM. Always use your most recent med list.          aspirin EC 81 MG tablet Take 1 tablet (81 mg total) by mouth daily.   atorvastatin 40 MG tablet Commonly known as:  LIPITOR Take 1 tablet (40 mg total) by mouth daily.   buPROPion 150 MG 24 hr tablet Commonly known as:  WELLBUTRIN XL Take 150 mg by mouth daily.   citalopram 40 MG tablet Commonly known as:  CELEXA Take 40 mg by mouth daily.   docusate sodium 100 MG capsule Commonly known as:  COLACE Take 100 mg by mouth daily as needed for mild constipation.   gabapentin 300 MG capsule Commonly known as:  NEURONTIN Take 1 capsule (300 mg total) by mouth 3 (three) times daily.   LORazepam 0.5 MG tablet Commonly known as:  ATIVAN Take 1 tablet (0.5 mg total) by mouth every 8 (eight) hours.   methocarbamol 750 MG tablet Commonly known as:  ROBAXIN Take 1 tablet (750 mg total) by mouth every 8 (eight) hours as needed (use for muscle cramps/pain).   metoprolol tartrate 25 MG tablet Commonly known as:  LOPRESSOR Take 1 tablet (25 mg total) by mouth 2 (two) times daily.   omeprazole 20 MG capsule Commonly known as:  PRILOSEC TAKE 1 CAPSULE BY MOUTH EVERY DAY    oxyCODONE 5 MG immediate release tablet Commonly known as:  Oxy IR/ROXICODONE Take 1 tablet (5 mg total) by mouth every 6 (six) hours as needed for moderate pain, severe pain or breakthrough pain.   triamterene-hydrochlorothiazide 37.5-25 MG tablet Commonly known as:  MAXZIDE-25 Take 1 tablet by mouth daily.      Patient was recently discharged from hospital and all medications have been reviewed.   THN CM Care Plan Problem One     Most Recent Value  Care Plan Problem One  Needs support for ongoing diagnostic tests and results.  Role Documenting the Problem One  Care Management [place] for Problem One  Active  THN CM Short Term Goal #1   Pt will participate in weekly calls over the next 2 weeks.  THN CM Short Term Goal #1 Start Date  01/15/18  Interventions for Short Term Goal #1  Advised of telephone support. Gave phone number. Sent successful letter.    THN CM Care Plan Problem Two     Most Recent Value  Care Plan Problem Two  Carotid artery disease with complication (tumor)  Role Documenting the Problem Two  Care Management [place] for Problem Two  Active  THN CM Short Term Goal #1   Pt will follow MD orders following surgery and notice of diagnostic findings to minimize complications and avoid rehospitalzation.  THN CM Short Term Goal #1 Start Date  01/15/18  Interventions for Short Term Goal #2   Advised of availabliliy of NP for assistance. Provided phone number.    THN CM Care Plan Problem Three     Most Recent Value  Care Plan Problem Three  Chronic care management of cerebral vascular disease.  Role Documenting the Problem Three  Care Management Coordinator  Care Plan for Problem Three  Active  THN CM Short Term Goal #1   Pt will be able to tell NP the 3 most important things she can do for herself to minimize progression of her disease at the end of 30 days.  THN CM Short Term Goal #1 Start Date  01/15/18  Interventions for Short Term  Goal #1  Advised I would be providing education about carotid artery disease.     [name]. Myrtie Neither, MSN, [facility] Gerontological Nurse Practitioner [facility] Care Management [phone]

## 2018-01-22 ENCOUNTER — Other Ambulatory Visit: Payer: Self-pay | Admitting: *Deleted

## 2018-01-27 ENCOUNTER — Encounter: Payer: Self-pay | Admitting: Surgery

## 2018-01-27 ENCOUNTER — Ambulatory Visit (HOSPITAL_COMMUNITY)
Admission: RE | Admit: 2018-01-27 | Discharge: 2018-01-27 | Disposition: A | Discharge status: Home / Self Care | Payer: Medicare HMO | Source: Ambulatory Visit | Attending: Surgery | Admitting: Surgery

## 2018-01-27 ENCOUNTER — Encounter (INDEPENDENT_AMBULATORY_CARE_PROVIDER_SITE_OTHER): Payer: Self-pay

## 2018-01-27 ENCOUNTER — Other Ambulatory Visit: Payer: Self-pay

## 2018-01-27 ENCOUNTER — Ambulatory Visit (INDEPENDENT_AMBULATORY_CARE_PROVIDER_SITE_OTHER): Payer: Medicare HMO | Admitting: Surgery

## 2018-01-27 ENCOUNTER — Other Ambulatory Visit: Payer: Self-pay | Admitting: *Deleted

## 2018-01-27 VITALS — BP 148/78 | HR 64 | Temp 97.6°F | Resp 20 | Ht 62.0 in | Wt 167.0 lb

## 2018-01-27 DIAGNOSIS — I6523 Occlusion and stenosis of bilateral carotid arteries: Secondary | ICD-10-CM

## 2018-01-27 DIAGNOSIS — I6521 Occlusion and stenosis of right carotid artery: Secondary | ICD-10-CM | POA: Diagnosis not present

## 2018-01-27 DIAGNOSIS — Z9889 Other specified postprocedural states: Secondary | ICD-10-CM | POA: Insufficient documentation

## 2018-01-27 DIAGNOSIS — F17200 Nicotine dependence, unspecified, uncomplicated: Secondary | ICD-10-CM | POA: Insufficient documentation

## 2018-01-27 DIAGNOSIS — I10 Essential (primary) hypertension: Secondary | ICD-10-CM | POA: Insufficient documentation

## 2018-01-27 NOTE — Progress Notes (Signed)
Patient name: Ashley Doyle MRN: [id number] DOB: [birth year] Sex: female  REASON FOR VISIT:     post op  HISTORY OF PRESENT ILLNESS:   Ashley Doyle is a 72 y.o. female who returns today for follow-up.  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  She has no complaints today  CURRENT MEDICATIONS:    Current Outpatient Medications  Medication Sig Dispense Refill  . aspirin EC 81 MG tablet Take 1 tablet (81 mg total) by mouth daily. 90 tablet 3  . atorvastatin (LIPITOR) 40 MG tablet Take 1 tablet (40 mg total) by mouth daily. 90 tablet 3  . buPROPion (WELLBUTRIN XL) 150 MG 24 hr tablet Take 150 mg by mouth daily.    . citalopram (CELEXA) 40 MG tablet Take 40 mg by mouth daily.  4  . docusate sodium (COLACE) 100 MG capsule Take 100 mg by mouth daily as needed for mild constipation.    . gabapentin (NEURONTIN) 300 MG capsule Take 1 capsule (300 mg total) by mouth 3 (three) times daily. 90 capsule 2  . LORazepam (ATIVAN) 0.5 MG tablet Take 1 tablet (0.5 mg total) by mouth every 8 (eight) hours. (Patient taking differently: Take 0.5 mg by mouth every 8 (eight) hours as needed for anxiety. ) 30 tablet 0  . methocarbamol (ROBAXIN) 750 MG tablet Take 1 tablet (750 mg total) by mouth every 8 (eight) hours as needed (use for muscle cramps/pain). 20 tablet 0  . metoprolol tartrate (LOPRESSOR) 25 MG tablet Take 1 tablet (25 mg total) by mouth 2 (two) times daily. 180 tablet 3  . omeprazole (PRILOSEC) 20 MG capsule TAKE 1 CAPSULE BY MOUTH EVERY DAY (Patient taking differently: Take 20 mg by mouth daily. ) 30 capsule 0  . oxyCODONE (OXY IR/ROXICODONE) 5 MG immediate release tablet Take 1 tablet (5 mg total) by mouth every 6 (six) hours as needed for moderate pain, severe pain or breakthrough pain. 6 tablet 0  . triamterene-hydrochlorothiazide (MAXZIDE-25) 37.5-25 MG tablet Take 1  tablet by mouth daily.  1   No current facility-administered medications for this visit.     REVIEW OF SYSTEMS:   [X]  denotes positive finding, [ ]  denotes negative finding Cardiac  Comments:  Chest pain or chest pressure:    Shortness of breath upon exertion:    Short of breath when lying flat:    Irregular heart rhythm:    Constitutional    Fever or chills:      PHYSICAL EXAM:   Vitals:   01/27/18 1556 01/27/18 1601  BP: 135/72 (!) 148/78  Pulse: 64   Resp: 20   Temp: 97.6 F (36.4 C)   TempSrc: Oral   SpO2: 93%   Weight: 167 lb (75.8 kg)   Height: 5\' 2"  (1.575 m)     GENERAL: The patient is a well-nourished female, in no acute distress. The vital signs are documented above. CARDIOVASCULAR: There is a regular rate and rhythm. PULMONARY: Non-labored respirations Incision is well-healed. Neurologically intact.  STUDIES:   Left carotid endarterectomy site is widely patent 80-99% right carotid stenosis  Pathology consistent with paraganglioma   MEDICAL ISSUES:   Patient will return in 6 weeks at which time we will consider scheduling her for right carotid endarterectomy.  I would like for her to continue to recover from her operation until then.  Annamarie Major, MD [facility] [phone]  Pager [phone]

## 2018-01-28 NOTE — Telephone Encounter (Signed)
Taken care of by NP

## 2018-02-07 ENCOUNTER — Encounter: Payer: Self-pay | Admitting: *Deleted

## 2018-02-07 ENCOUNTER — Other Ambulatory Visit: Payer: Self-pay | Admitting: *Deleted

## 2018-02-07 NOTE — Patient Outreach (Signed)
Unable to reach pt. Will call again next week.  Ashley Doyle. Myrtie Neither, MSN, [facility] Gerontological Nurse Practitioner [facility] Care Management [phone]

## 2018-02-07 NOTE — Patient Outreach (Signed)
Transition of care call. Unable to contact pt. I will try again next week.  [name]. Myrtie Neither, MSN, [facility] Gerontological Nurse Practitioner [facility] Care Management [phone]

## 2018-02-07 NOTE — Patient Outreach (Signed)
I have been unable to contact [name] on my last 3 attempts. I have left messages however, I have not received any call back. I have sent her a previous unsuccessful letter and now will send a case closure letter and let her MD know that her case is closed because of not being able to contact her.  Ashley Doyle. Ashley Neither, MSN, [facility] Gerontological Nurse Practitioner [facility] Care Management [phone]

## 2018-03-03 DIAGNOSIS — E782 Mixed hyperlipidemia: Secondary | ICD-10-CM | POA: Diagnosis not present

## 2018-03-03 DIAGNOSIS — I10 Essential (primary) hypertension: Secondary | ICD-10-CM | POA: Diagnosis not present

## 2018-03-03 DIAGNOSIS — E1122 Type 2 diabetes mellitus with diabetic chronic kidney disease: Secondary | ICD-10-CM | POA: Diagnosis not present

## 2018-03-03 DIAGNOSIS — R7301 Impaired fasting glucose: Secondary | ICD-10-CM | POA: Diagnosis not present

## 2018-03-17 ENCOUNTER — Ambulatory Visit: Payer: Medicare HMO | Admitting: Surgery

## 2018-04-21 ENCOUNTER — Encounter: Payer: Self-pay | Admitting: *Deleted

## 2018-04-21 ENCOUNTER — Other Ambulatory Visit: Payer: Self-pay

## 2018-04-21 ENCOUNTER — Ambulatory Visit (INDEPENDENT_AMBULATORY_CARE_PROVIDER_SITE_OTHER): Payer: Medicare HMO | Admitting: Surgery

## 2018-04-21 ENCOUNTER — Other Ambulatory Visit: Payer: Self-pay | Admitting: *Deleted

## 2018-04-21 ENCOUNTER — Encounter: Payer: Self-pay | Admitting: Surgery

## 2018-04-21 VITALS — BP 151/85 | HR 70 | Temp 97.0°F | Resp 14 | Ht 62.0 in | Wt 176.0 lb

## 2018-04-21 DIAGNOSIS — I6523 Occlusion and stenosis of bilateral carotid arteries: Secondary | ICD-10-CM | POA: Diagnosis not present

## 2018-04-21 NOTE — H&P (View-Only) (Signed)
Vascular and Vein Specialist of [place]  Patient name: Ashley Doyle MRN: [id number] DOB: [birth year] Sex: female   REASON FOR VISIT:    Follow up  HISOTRY OF PRESENT ILLNESS:    Ashley Doyle is a 72 y.o. female who returns today for follow-up.  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  She remains asymptomatic.  Specifically, she denies numbness or weakness in either extremity.  She notes her speech.  She denies amaurosis fugax.  She is eager to get the right side corrected.  The patient has been diagnosed with diabetes in the past however she is not on medication and her A1c is 5.6 and she no longer carries that diagnosis. She has a history of COPD secondary to significant tobacco abuse. She is medically managed for hypertension which is under good control. She takes a statin for hypercholesterolemia.  PAST MEDICAL HISTORY:   Past Medical History:  Diagnosis Date  . Anxiety   . Arthritis   . Asthma    not on inhalers now- no flares recently  . Breast cancer ([place])   . Chronic kidney disease    "as a child" "severe infection and it was toxic"  . Constipation   . COPD (chronic obstructive pulmonary disease) ([place])   . Depression   . Dyspnea    with exertion  . Emphysema of lung ([place])   . Family history of adverse reaction to anesthesia    sister has PONV  . GERD (gastroesophageal reflux disease)   . Headache    history of migraines- none since hysterectomy- has bad headaches at times  . History of chemotherapy   . HOH (hard of hearing)   . Hypertension   . Invasive ductal carcinoma of breast, female, right ([place]) 03/07/2016  . Peripheral vascular disease ([place])   . Pinched nerve in neck    and back  . Sciatica   . Stroke [facility])    2 - right sided weakness- right leg - "speech expressive aphasia when nervous"  . Tuberculosis    "several  years ago" 04/11/16  . Wears glasses   . Wears hearing aid    left ear     FAMILY HISTORY:   Family History  Problem Relation Age of Onset  . Stroke Mother   . Heart disease Mother   . Lung cancer Father     SOCIAL HISTORY:   Social History   Tobacco Use  . Smoking status: Current Every Day Smoker    Years: 54.00    Types: Cigarettes  . Smokeless tobacco: Never Used  . Tobacco comment: 30 cigarettes per day  Substance Use Topics  . Alcohol use: No     ALLERGIES:   Allergies  Allergen Reactions  . No Known Allergies      CURRENT MEDICATIONS:   Current Outpatient Medications  Medication Sig Dispense Refill  . aspirin EC 81 MG tablet Take 1 tablet (81 mg total) by mouth daily. 90 tablet 3  . buPROPion (WELLBUTRIN XL) 150 MG 24 hr tablet Take 150 mg by mouth daily.    . citalopram (CELEXA) 40 MG tablet Take 40 mg by mouth daily.  4  . docusate sodium (COLACE) 100 MG capsule Take 100 mg by mouth daily as needed for mild constipation.    . gabapentin (NEURONTIN) 300 MG capsule Take 1 capsule (300 mg total) by mouth 3 (three) times daily. 90 capsule 2  .  LORazepam (ATIVAN) 0.5 MG tablet Take 1 tablet (0.5 mg total) by mouth every 8 (eight) hours. (Patient taking differently: Take 0.5 mg by mouth every 8 (eight) hours as needed for anxiety. ) 30 tablet 0  . methocarbamol (ROBAXIN) 750 MG tablet Take 1 tablet (750 mg total) by mouth every 8 (eight) hours as needed (use for muscle cramps/pain). 20 tablet 0  . omeprazole (PRILOSEC) 20 MG capsule TAKE 1 CAPSULE BY MOUTH EVERY DAY (Patient taking differently: Take 20 mg by mouth daily. ) 30 capsule 0  . triamterene-hydrochlorothiazide (MAXZIDE-25) 37.5-25 MG tablet Take 1 tablet by mouth daily.  1  . atorvastatin (LIPITOR) 40 MG tablet Take 1 tablet (40 mg total) by mouth daily. 90 tablet 3  . metoprolol tartrate (LOPRESSOR) 25 MG tablet Take 1 tablet (25 mg total) by mouth 2 (two) times daily. 180 tablet 3  . oxyCODONE (OXY  IR/ROXICODONE) 5 MG immediate release tablet Take 1 tablet (5 mg total) by mouth every 6 (six) hours as needed for moderate pain, severe pain or breakthrough pain. (Patient not taking: Reported on 04/21/2018) 6 tablet 0   No current facility-administered medications for this visit.     REVIEW OF SYSTEMS:   [X]  denotes positive finding, [ ]  denotes negative finding Cardiac  Comments:  Chest pain or chest pressure:    Shortness of breath upon exertion:    Short of breath when lying flat:    Irregular heart rhythm:        Vascular    Pain in calf, thigh, or hip brought on by ambulation:    Pain in feet at night that wakes you up from your sleep:     Blood clot in your veins:    Leg swelling:         Pulmonary    Oxygen at home:    Productive cough:     Wheezing:         Neurologic    Sudden weakness in arms or legs:     Sudden numbness in arms or legs:     Sudden onset of difficulty speaking or slurred speech:    Temporary loss of vision in one eye:     Problems with dizziness:         Gastrointestinal    Blood in stool:     Vomited blood:         Genitourinary    Burning when urinating:     Blood in urine:        Psychiatric    Major depression:         Hematologic    Bleeding problems:    Problems with blood clotting too easily:        Skin    Rashes or ulcers:        Constitutional    Fever or chills:      PHYSICAL EXAM:   Vitals:   04/21/18 1537 04/21/18 1539  BP: (!) 142/69 (!) 151/85  Pulse: 70 70  Resp: 14   Temp: (!) 97 F (36.1 C)   TempSrc: Oral   SpO2: 96%   Weight: 176 lb (79.8 kg)   Height: 5\' 2"  (1.575 m)     GENERAL: The patient is a well-nourished female, in no acute distress. The vital signs are documented above. CARDIAC: There is a regular rate and rhythm.  VASCULAR: No carotid bruit PULMONARY: Non-labored respirations MUSCULOSKELETAL: There are no major deformities or cyanosis. NEUROLOGIC: No focal weakness or paresthesias are  detected. SKIN: There are no ulcers or rashes noted. PSYCHIATRIC: The patient has a normal affect.  STUDIES:   I have reviewed the following :  CTA Neck: 1. 75% proximal right ICA stenosis. 2. 70% proximal left ICA stenosis. 3. 2.3 cm hypervascular mass at the left carotid bifurcation consistent with a carotid body tumor. 4. Moderate bilateral vertebral artery origin stenosis. 5. Aortic Atherosclerosis (ICD10-I70.0) and Emphysema (ICD10-J43.9).   Carotid Duplex:  Right Carotid: Velocities in the right ICA are consistent with a 80-99%        stenosis. The ECA appears <50% stenosed.  Left Carotid: Patent left carotid endarterectomy site with no evidence of       restenosis or hyperplasia.  Vertebrals: Bilateral vertebral arteries demonstrate antegrade flow. Subclavians: Normal flow hemodynamics were seen in bilateral subclavian       arteries MEDICAL ISSUES:   Asymptomatic right Carotid stenosis: We discussed proceeding with right carotid endarterectomy.  She is eager to get this done.  She understands the risks and benefits the operation including the risk of stroke, nerve injury, cardiopulmonary complications, and bleeding.  She does not have any residual symptoms from her left-sided procedure.  I will not send her to ENT for vocal cord analysis as she does not have any hoarseness.  She wants to get this done soon as possible and so we put her on the schedule for Friday, December 27.    Annamarie Major, MD [facility] Special[facility] of [facility] [phone] Pager [phone]

## 2018-04-21 NOTE — Progress Notes (Signed)
Vascular and Vein Specialist of   Patient name: Ashley Doyle MRN: [id number] DOB: 1945-11-16 Sex: female   REASON FOR VISIT:    Follow up  HISOTRY OF PRESENT ILLNESS:    Ashley Doyle is a 72 y.o. female who returns today for follow-up.  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  She remains asymptomatic.  Specifically, she denies numbness or weakness in either extremity.  She notes her speech.  She denies amaurosis fugax.  She is eager to get the right side corrected.  The patient has been diagnosed with diabetes in the past however she is not on medication and her A1c is 5.6 and she no longer carries that diagnosis. She has a history of COPD secondary to significant tobacco abuse. She is medically managed for hypertension which is under good control. She takes a statin for hypercholesterolemia.  PAST MEDICAL HISTORY:   Past Medical History:  Diagnosis Date  . Anxiety   . Arthritis   . Asthma    not on inhalers now- no flares recently  . Breast cancer ([place])   . Chronic kidney disease    "as a child" "severe infection and it was toxic"  . Constipation   . COPD (chronic obstructive pulmonary disease) ([place])   . Depression   . Dyspnea    with exertion  . Emphysema of lung ([place])   . Family history of adverse reaction to anesthesia    sister has PONV  . GERD (gastroesophageal reflux disease)   . Headache    history of migraines- none since hysterectomy- has bad headaches at times  . History of chemotherapy   . HOH (hard of hearing)   . Hypertension   . Invasive ductal carcinoma of breast, female, right ([place]) 03/07/2016  . Peripheral vascular disease ([place])   . Pinched nerve in neck    and back  . Sciatica   . Stroke [facility])    2 - right sided weakness- right leg - "speech expressive aphasia when nervous"  . Tuberculosis    "several  years ago" 04/11/16  . Wears glasses   . Wears hearing aid    left ear     FAMILY HISTORY:   Family History  Problem Relation Age of Onset  . Stroke Mother   . Heart disease Mother   . Lung cancer Father     SOCIAL HISTORY:   Social History   Tobacco Use  . Smoking status: Current Every Day Smoker    Years: 54.00    Types: Cigarettes  . Smokeless tobacco: Never Used  . Tobacco comment: 30 cigarettes per day  Substance Use Topics  . Alcohol use: No     ALLERGIES:   Allergies  Allergen Reactions  . No Known Allergies      CURRENT MEDICATIONS:   Current Outpatient Medications  Medication Sig Dispense Refill  . aspirin EC 81 MG tablet Take 1 tablet (81 mg total) by mouth daily. 90 tablet 3  . buPROPion (WELLBUTRIN XL) 150 MG 24 hr tablet Take 150 mg by mouth daily.    . citalopram (CELEXA) 40 MG tablet Take 40 mg by mouth daily.  4  . docusate sodium (COLACE) 100 MG capsule Take 100 mg by mouth daily as needed for mild constipation.    . gabapentin (NEURONTIN) 300 MG capsule Take 1 capsule (300 mg total) by mouth 3 (three) times daily. 90 capsule 2  .  LORazepam (ATIVAN) 0.5 MG tablet Take 1 tablet (0.5 mg total) by mouth every 8 (eight) hours. (Patient taking differently: Take 0.5 mg by mouth every 8 (eight) hours as needed for anxiety. ) 30 tablet 0  . methocarbamol (ROBAXIN) 750 MG tablet Take 1 tablet (750 mg total) by mouth every 8 (eight) hours as needed (use for muscle cramps/pain). 20 tablet 0  . omeprazole (PRILOSEC) 20 MG capsule TAKE 1 CAPSULE BY MOUTH EVERY DAY (Patient taking differently: Take 20 mg by mouth daily. ) 30 capsule 0  . triamterene-hydrochlorothiazide (MAXZIDE-25) 37.5-25 MG tablet Take 1 tablet by mouth daily.  1  . atorvastatin (LIPITOR) 40 MG tablet Take 1 tablet (40 mg total) by mouth daily. 90 tablet 3  . metoprolol tartrate (LOPRESSOR) 25 MG tablet Take 1 tablet (25 mg total) by mouth 2 (two) times daily. 180 tablet 3  . oxyCODONE (OXY  IR/ROXICODONE) 5 MG immediate release tablet Take 1 tablet (5 mg total) by mouth every 6 (six) hours as needed for moderate pain, severe pain or breakthrough pain. (Patient not taking: Reported on 04/21/2018) 6 tablet 0   No current facility-administered medications for this visit.     REVIEW OF SYSTEMS:   [X]  denotes positive finding, [ ]  denotes negative finding Cardiac  Comments:  Chest pain or chest pressure:    Shortness of breath upon exertion:    Short of breath when lying flat:    Irregular heart rhythm:        Vascular    Pain in calf, thigh, or hip brought on by ambulation:    Pain in feet at night that wakes you up from your sleep:     Blood clot in your veins:    Leg swelling:         Pulmonary    Oxygen at home:    Productive cough:     Wheezing:         Neurologic    Sudden weakness in arms or legs:     Sudden numbness in arms or legs:     Sudden onset of difficulty speaking or slurred speech:    Temporary loss of vision in one eye:     Problems with dizziness:         Gastrointestinal    Blood in stool:     Vomited blood:         Genitourinary    Burning when urinating:     Blood in urine:        Psychiatric    Major depression:         Hematologic    Bleeding problems:    Problems with blood clotting too easily:        Skin    Rashes or ulcers:        Constitutional    Fever or chills:      PHYSICAL EXAM:   Vitals:   04/21/18 1537 04/21/18 1539  BP: (!) 142/69 (!) 151/85  Pulse: 70 70  Resp: 14   Temp: (!) 97 F (36.1 C)   TempSrc: Oral   SpO2: 96%   Weight: 176 lb (79.8 kg)   Height: 5\' 2"  (1.575 m)     GENERAL: The patient is a well-nourished female, in no acute distress. The vital signs are documented above. CARDIAC: There is a regular rate and rhythm.  VASCULAR: No carotid bruit PULMONARY: Non-labored respirations MUSCULOSKELETAL: There are no major deformities or cyanosis. NEUROLOGIC: No focal weakness or paresthesias are  detected. SKIN: There are no ulcers or rashes noted. PSYCHIATRIC: The patient has a normal affect.  STUDIES:   I have reviewed the following :  CTA Neck: 1. 75% proximal right ICA stenosis. 2. 70% proximal left ICA stenosis. 3. 2.3 cm hypervascular mass at the left carotid bifurcation consistent with a carotid body tumor. 4. Moderate bilateral vertebral artery origin stenosis. 5. Aortic Atherosclerosis (ICD10-I70.0) and Emphysema (ICD10-J43.9).   Carotid Duplex:  Right Carotid: Velocities in the right ICA are consistent with a 80-99%        stenosis. The ECA appears <50% stenosed.  Left Carotid: Patent left carotid endarterectomy site with no evidence of       restenosis or hyperplasia.  Vertebrals: Bilateral vertebral arteries demonstrate antegrade flow. Subclavians: Normal flow hemodynamics were seen in bilateral subclavian       arteries MEDICAL ISSUES:   Asymptomatic right Carotid stenosis: We discussed proceeding with right carotid endarterectomy.  She is eager to get this done.  She understands the risks and benefits the operation including the risk of stroke, nerve injury, cardiopulmonary complications, and bleeding.  She does not have any residual symptoms from her left-sided procedure.  I will not send her to ENT for vocal cord analysis as she does not have any hoarseness.  She wants to get this done soon as possible and so we put her on the schedule for Friday, December 27.    Annamarie Major, MD [facility]in Specialists of [facility] [phone] Pager [phone]

## 2018-05-02 NOTE — Pre-Procedure Instructions (Signed)
ANSHI JALLOH  05/02/2018      [id number] - [place], [place] [address] [place] [address] Phone: [phone] Fax: [phone]    Your procedure is scheduled on 05/09/18.  Report to [facility] Admitting at 8:15 A.M.  Call this number if you have problems the morning of surgery:  [phone]   Remember:  Do not eat or drink after midnight.      Take these medicines the morning of surgery with A SIP OF WATER ---celexa,neurontin,ativan,metoprolol,prilosec,percocet    Do not wear jewelry, make-up or nail polish.  Do not wear lotions, powders, or perfumes, or deodorant.  Do not shave 48 hours prior to surgery.  Men may shave face and neck.  Do not bring valuables to the hospital.  [facility] is not responsible for any belongings or valuables.  Contacts, dentures or bridgework may not be worn into surgery.  Leave your suitcase in the car.  After surgery it may be brought to your room.  For patients admitted to the hospital, discharge time will be determined by your treatment team.  Patients discharged the day of surgery will not be allowed to drive home.   Name and phone number of your driver:   Do not take any aspirin,anti-inflammatories,vitamins,or herbal supplements 5-7 days prior to surgery. Special instructions:  [place] - Preparing for Surgery  Before surgery, you can play an important role.  Because skin is not sterile, your skin needs to be as free of germs as possible.  You can reduce the number of germs on you skin by washing with CHG (chlorahexidine gluconate) soap before surgery.  CHG is an antiseptic cleaner which kills germs and bonds with the skin to continue killing germs even after washing.  Oral Hygiene is also important in reducing the risk of infection.  Remember to brush your teeth with your regular toothpaste the morning of surgery.  Please DO NOT use if you have an allergy to CHG or  antibacterial soaps.  If your skin becomes reddened/irritated stop using the CHG and inform your nurse when you arrive at Short Stay.  Do not shave (including legs and underarms) for at least 48 hours prior to the first CHG shower.  You may shave your face.  Please follow these instructions carefully:   1.  Shower with CHG Soap the night before surgery and the morning of Surgery.  2.  If you choose to wash your hair, wash your hair first as usual with your normal shampoo.  3.  After you shampoo, rinse your hair and body thoroughly to remove the shampoo. 4.  Use CHG as you would any other liquid soap.  You can apply chg directly to the skin and wash gently with a      scrungie or washcloth.           5.  Apply the CHG Soap to your body ONLY FROM THE NECK DOWN.   Do not use on open wounds or open sores. Avoid contact with your eyes, ears, mouth and genitals (private parts).  Wash genitals (private parts) with your normal soap.  6.  Wash thoroughly, paying special attention to the area where your surgery will be performed.  7.  Thoroughly rinse your body with warm water from the neck down.  8.  DO NOT shower/wash with your normal soap after using and rinsing off the CHG Soap.  9.  Pat yourself dry  with a clean towel.            10.  Wear clean pajamas.            11.  Place clean sheets on your bed the night of your first shower and do not sleep with pets.  Day of Surgery  Do not apply any lotions/deoderants the morning of surgery.   Please wear clean clothes to the hospital/surgery center. Remember to brush your teeth with toothpaste.    Please read over the following fact sheets that you were given. MRSA Information

## 2018-05-05 ENCOUNTER — Encounter (HOSPITAL_COMMUNITY): Payer: Self-pay

## 2018-05-05 ENCOUNTER — Other Ambulatory Visit: Payer: Self-pay

## 2018-05-05 ENCOUNTER — Encounter (HOSPITAL_COMMUNITY)
Admission: RE | Admit: 2018-05-05 | Discharge: 2018-05-05 | Disposition: A | Discharge status: Home / Self Care | Payer: Medicare HMO | Source: Ambulatory Visit | Attending: Surgery | Admitting: Surgery

## 2018-05-05 DIAGNOSIS — Z01818 Encounter for other preprocedural examination: Secondary | ICD-10-CM | POA: Diagnosis not present

## 2018-05-05 DIAGNOSIS — I10 Essential (primary) hypertension: Secondary | ICD-10-CM | POA: Insufficient documentation

## 2018-05-05 DIAGNOSIS — Z7982 Long term (current) use of aspirin: Secondary | ICD-10-CM | POA: Insufficient documentation

## 2018-05-05 DIAGNOSIS — F329 Major depressive disorder, single episode, unspecified: Secondary | ICD-10-CM | POA: Insufficient documentation

## 2018-05-05 DIAGNOSIS — J439 Emphysema, unspecified: Secondary | ICD-10-CM | POA: Insufficient documentation

## 2018-05-05 DIAGNOSIS — I6521 Occlusion and stenosis of right carotid artery: Secondary | ICD-10-CM | POA: Insufficient documentation

## 2018-05-05 DIAGNOSIS — I69851 Hemiplegia and hemiparesis following other cerebrovascular disease affecting right dominant side: Secondary | ICD-10-CM | POA: Insufficient documentation

## 2018-05-05 DIAGNOSIS — M1990 Unspecified osteoarthritis, unspecified site: Secondary | ICD-10-CM | POA: Insufficient documentation

## 2018-05-05 DIAGNOSIS — Z9011 Acquired absence of right breast and nipple: Secondary | ICD-10-CM | POA: Diagnosis not present

## 2018-05-05 DIAGNOSIS — Z79899 Other long term (current) drug therapy: Secondary | ICD-10-CM | POA: Diagnosis not present

## 2018-05-05 DIAGNOSIS — K219 Gastro-esophageal reflux disease without esophagitis: Secondary | ICD-10-CM | POA: Diagnosis not present

## 2018-05-05 DIAGNOSIS — F17210 Nicotine dependence, cigarettes, uncomplicated: Secondary | ICD-10-CM | POA: Insufficient documentation

## 2018-05-05 DIAGNOSIS — F419 Anxiety disorder, unspecified: Secondary | ICD-10-CM | POA: Diagnosis not present

## 2018-05-05 DIAGNOSIS — Z853 Personal history of malignant neoplasm of breast: Secondary | ICD-10-CM | POA: Insufficient documentation

## 2018-05-05 DIAGNOSIS — I739 Peripheral vascular disease, unspecified: Secondary | ICD-10-CM | POA: Insufficient documentation

## 2018-05-05 LAB — URINALYSIS, ROUTINE W REFLEX MICROSCOPIC
Bilirubin Urine: NEGATIVE
Glucose, UA: NEGATIVE mg/dL
Hgb urine dipstick: NEGATIVE
KETONES UR: NEGATIVE mg/dL
NITRITE: NEGATIVE
Protein, ur: NEGATIVE mg/dL
Specific Gravity, Urine: <1.005 — ABNORMAL LOW (ref 1.005–1.030)
pH: 6 (ref 5.0–8.0)

## 2018-05-05 LAB — COMPREHENSIVE METABOLIC PANEL
ALK PHOS: 79 U/L (ref 38–126)
ALT: 17 U/L (ref 0–44)
AST: 21 U/L (ref 15–41)
Albumin: 4.3 g/dL (ref 3.5–5.0)
Anion gap: 10 (ref 5–15)
BILIRUBIN TOTAL: 0.9 mg/dL (ref 0.3–1.2)
BUN: 9 mg/dL (ref 8–23)
CALCIUM: 9.3 mg/dL (ref 8.9–10.3)
CHLORIDE: 99 mmol/L (ref 98–111)
CO2: 27 mmol/L (ref 22–32)
CREATININE: 1.01 mg/dL — AB (ref 0.44–1.00)
GFR, EST NON AFRICAN AMERICAN: 56 mL/min — AB (ref >60)
Glucose, Bld: 93 mg/dL (ref 70–99)
Potassium: 4.4 mmol/L (ref 3.5–5.1)
Sodium: 136 mmol/L (ref 135–145)
TOTAL PROTEIN: 6.8 g/dL (ref 6.5–8.1)

## 2018-05-05 LAB — PROTIME-INR
INR: 1.11
Prothrombin Time: 14.3 seconds (ref 11.4–15.2)

## 2018-05-05 LAB — TYPE AND SCREEN
ABO/RH(D): O NEG
Antibody Screen: NEGATIVE

## 2018-05-05 LAB — APTT: aPTT: 29 seconds (ref 24–36)

## 2018-05-05 LAB — CBC
HEMATOCRIT: 46 % (ref 36.0–46.0)
Hemoglobin: 15.8 g/dL — ABNORMAL HIGH (ref 12.0–15.0)
MCH: 31.9 pg (ref 26.0–34.0)
MCHC: 34.3 g/dL (ref 30.0–36.0)
MCV: 92.9 fL (ref 80.0–100.0)
PLATELETS: 220 10*3/uL (ref 150–400)
RBC: 4.95 MIL/uL (ref 3.87–5.11)
RDW: 13.6 % (ref 11.5–15.5)
WBC: 8.3 10*3/uL (ref 4.0–10.5)
nRBC: 0 % (ref 0.0–0.2)

## 2018-05-05 LAB — URINALYSIS, MICROSCOPIC (REFLEX)

## 2018-05-05 LAB — SURGICAL PCR SCREEN
MRSA, PCR: NEGATIVE
Staphylococcus aureus: NEGATIVE

## 2018-05-05 MED ORDER — CHLORHEXIDINE GLUCONATE CLOTH 2 % EX PADS: 6.0000 | MEDICATED_PAD | Freq: Once | CUTANEOUS | Status: DC

## 2018-05-05 NOTE — Progress Notes (Addendum)
Anesthesia PAT Evaluation:   Case:  [id number] Date/Time:  05/09/18 1004   Procedure:  ENDARTERECTOMY CAROTID RIGHT (Right )   Anesthesia type:  General   Pre-op diagnosis:  RIGHT CAROTID STENOSIS   Location:  [facility] OR ROOM 11 / [place]   Surgeon:  Serafina Mitchell, MD      DISCUSSION: Patient is a 72 year old female scheduled for the above procedure. She is s/p embolization of left carotid body tumor 01/02/18 followed by left carotid endarterectomy on 01/03/18.   History includes smoking (1 1/2 PPD), COPD/emphysema, asthma, exertional dyspnea, CVA > 10 years ago (right sided weakness; denied current dysphagia), HTN, PVD, bilateral carotid artery stenosis (embolization of left carotid body tumor 01/02/18 and left CEA 01/03/18; pending right CEA), right breast cancer (s/p chemotherapy, s/p right mastectomy 12/01/16), TB (remote, s/p treatment), Port-a-cath (04/12/16, s/p removal 11/22/16).   For anesthesia concerns, she reported that she was "out of it" for at least 3-4 hours following anesthesia for carotid body embolization. She denied known perioperative stroke and denied dysphagia, dysarthria or new facial droop or weakness since these procedures. She reported that she was told that she took too many sedating type medications on the morning of surgery. (She thinks it was mainly the Neurontin.) She did much better after the left CEA procedure on 01/03/18. We reviewed her medication list provided by the PAT RN--we discussed her taking metoprolol and Prilosec on the morning of surgery. She can take Celexa if she desires, but will not take Neurontin that morning. She will not take Ativan or oxycodone unless needed.    She was seen by cardiologist Kate Sable, MD on 11/18/17 for preoperative risk assessment. Based on her symptoms (occasional chest pains, SOB, dizziness) and risk factors a stress test was ordered which was intermediate risk (see below). He started her on metoprolol BID to help decrease her  perioperative risk--which she says she is still taking. When I evaluated her initially on 12/25/17, she described chest pain as only lasting for a few seconds and that her symptoms had been going on for ~ three years. Today she denied any new or progressive symptoms. She is able to clean her house including sweeping/vacuuming. She does occasionally walk with a walker, but ambulated independently at PAT. She was not aware that she would require any cardiology follow-up.  Above discussed with anesthesiologist Annye Asa, MD. Based on stress test results in July and CAD risk factors, would recommend preoperative cardiology input. I have sent a high priority staff message to Dr. Bronson Ing (although staff reported Carlyle Dolly, MD may be covering his in-basket) regarding next steps. I also sent a staff message to Dr. Trula Slade with update. Chart will be left for follow-up.    ADDENDUM 05/06/18 12:22 PM: I received the following response regarding preoperative input from Carlyle Dolly, MD who is covering for Dr. Bronson Ing, "Ok to proceed with vascular surgery, the overall clearance and recommendations have not changed from her prior CEA a few months ago. The stress test findings are not to the degree that would prohibit her surgery. This is consistent with Dr Court Joy clearance prior to her 12/2017 surgery as well."   VS: BP (!) 150/75   Pulse 75   Temp 36.8 C   Resp 18   Ht 5\' 2"  (1.575 m)   Wt 80.2 kg   SpO2 95%   BMI 32.36 kg/m  Patient is a pleasant Caucasian female in NAD. Husband at side. No conversational dyspnea. She is  hard of hearing. She does have mild droop of the right corner of her mouth. Heart RRR, no murmur noted. Lungs clear. No pretibial edema. She continues to smoke, but denied any recent respiratory illnesses.   PROVIDERS: Celene Squibb, MD is PCP. Kate Sable, MD is cardiologist. Twana First MD is oncologist. Last visit 09/19/17. She said she is not  currently seeing oncology.   LABS: Labs reviewed: Acceptable for surgery. (all labs ordered are listed, but only abnormal results are displayed)  Labs Reviewed  CBC - Abnormal; Notable for the following components:      Result Value   Hemoglobin 15.8 (*)    All other components within normal limits  COMPREHENSIVE METABOLIC PANEL - Abnormal; Notable for the following components:   Creatinine, Ser 1.01 (*)    GFR calc non Af Amer 56 (*)    All other components within normal limits  URINALYSIS, ROUTINE W REFLEX MICROSCOPIC - Abnormal; Notable for the following components:   Specific Gravity, Urine <1.005 (*)    Leukocytes, UA SMALL (*)    All other components within normal limits  URINALYSIS, MICROSCOPIC (REFLEX) - Abnormal; Notable for the following components:   Bacteria, UA FEW (*)    All other components within normal limits  SURGICAL PCR SCREEN  APTT  PROTIME-INR  TYPE AND SCREEN    IMAGES: Carotid U/S 01/27/18: Final Interpretation: - Right Carotid: Velocities in the right ICA are consistent with a 80-99%                stenosis. The ECA appears <50% stenosed. - Left Carotid: Patent left carotid endarterectomy site with no evidence of               restenosis or hyperplasia. - Vertebrals:  Bilateral vertebral arteries demonstrate antegrade flow. - Subclavians: Normal flow hemodynamics were seen in bilateral subclavian              arteries.   EKG: 05/05/18: NSR, possible left atrial enlargement. Septal infarct (age undetermined). Overall, EKG appears stable when compared to 07/04/17.     CV: Nuclear stress test 12/05/17:  No diagnostic ST segment changes over baseline abnormalities.  Medium sized, moderate intensity, mid to apical inferolateral and basal inferior/inferoseptal defects. There is reversibility noted in the mid to apical segment and partial reversibility in the basal segment. This is consistent with potential scar and mild to moderate ischemic  territory.  This is an intermediate risk study.  Nuclear stress EF: 61%. (Report reviewed by: Arnoldo Lenis, MD on 12/06/2017 at 1:43 PM EDT Stress tests shows some mild to moderate blockages, overall considered intermediate risk. Will defer to Dr Bronson Ing who will be back early next week regarding the implications for her surgery - Herminio Commons, MD on 12/10/2017 at 1:22 PM EDT She is at increased risk for a cardiac event in the perioperative period. Start metoprolol 25 mg twice daily. This will help reduce perioperative risk.)  Echo 06/14/16: Study Conclusions - Left ventricle: The cavity size was normal. Wall thickness was normal. Systolic function was normal. The estimated ejection fraction was in the range of 60% to 65%. Wall motion was normal; there were no regional wall motion abnormalities. - Right atrium: The atrium was mildly dilated.   Past Medical History:  Diagnosis Date  . Anxiety   . Arthritis   . Asthma    not on inhalers now- no flares recently  . Breast cancer ([place])   . Chronic kidney disease    "  as a child" "severe infection and it was toxic"  . Constipation   . COPD (chronic obstructive pulmonary disease) ([place])   . Depression   . Dyspnea    with exertion  . Emphysema of lung ([place])   . Family history of adverse reaction to anesthesia    sister has PONV  . GERD (gastroesophageal reflux disease)   . Headache    history of migraines- none since hysterectomy- has bad headaches at times  . History of chemotherapy   . HOH (hard of hearing)   . Hypertension   . Invasive ductal carcinoma of breast, female, right ([place]) 03/07/2016  . Peripheral vascular disease ([place])   . Pinched nerve in neck    and back  . Sciatica   . Stroke [facility])    2 - right sided weakness- right leg - "speech expressive aphasia when nervous"  . Tuberculosis    "several years ago" 04/11/16  . Wears glasses   . Wears hearing aid    left ear    Past Surgical  History:  Procedure Laterality Date  . ABDOMINAL HYSTERECTOMY    . APPENDECTOMY    . Arm surgery Right    injury- cut with glass  . BREAST SURGERY    . CAROTID BODY TUMOR EXCISION Left 01/03/2018   Procedure: TUMOR EXCISION CAROTID BODY LEFT;  Surgeon: Serafina Mitchell, MD;  Location: [place];  Service: Vascular;  Laterality: Left;  . ENDARTERECTOMY Left 01/03/2018   Procedure: ENDARTERECTOMY CAROTID LEFT;  Surgeon: Serafina Mitchell, MD;  Location: [place];  Service: Vascular;  Laterality: Left;  . INNER EAR SURGERY Right   . IR ANGIO EXTERNAL CAROTID SEL EXT CAROTID UNI L MOD SED  01/02/2018  . IR ANGIO INTRA EXTRACRAN SEL COM CAROTID INNOMINATE BILAT MOD SED  01/02/2018  . IR ANGIO VERTEBRAL SEL SUBCLAVIAN INNOMINATE UNI R MOD SED  01/02/2018  . IR ANGIOGRAM FOLLOW UP STUDY  01/02/2018  . IR NEURO EACH ADD'L AFTER BASIC UNI LEFT (MS)  01/02/2018  . IR TRANSCATH/EMBOLIZ  01/02/2018  . MASTECTOMY WITH RADIOACTIVE SEED GUIDED EXCISION AND AXILLARY SENTINEL LYMPH NODE BIOPSY Right 11/22/2016   Procedure: REMOVAL OF RIGHT BREAST RADIOACTIVE SEED, RIGHT TOTAL MASTECTOMY WITH RADIOACTIVE SEED TARGETED RIGHT AXILLARY LYMPH NODE EXCISION AND AXILLARY SENTINEL LYMPH NODE BIOPSY, POSSIBLE RIGHT AXILLARY DISSECTION;  Surgeon: Rolm Bookbinder, MD;  Location: [place];  Service: General;  Laterality: Right;  . PATCH ANGIOPLASTY Left 01/03/2018   Procedure: LEFT CAROTID ARTERY PATCH ANGIOPLASTY USING XENOSURE BIOLOGIC PATCH;  Surgeon: Serafina Mitchell, MD;  Location: [place];  Service: Vascular;  Laterality: Left;  . PORT-A-CATH REMOVAL Left 11/22/2016   Procedure: REMOVAL PORT-A-CATH;  Surgeon: Rolm Bookbinder, MD;  Location: [place];  Service: General;  Laterality: Left;  . PORTACATH PLACEMENT N/A 04/12/2016   Procedure: INSERTION PORT-A-CATH WITH [place];  Surgeon: Rolm Bookbinder, MD;  Location: [place];  Service: General;  Laterality: N/A;  . RADIOLOGY WITH ANESTHESIA N/A 01/02/2018   Procedure: EMBOLIZATION OF CAROTID  BODY TUMOR;  Surgeon: Luanne Bras, MD;  Location: [place];  Service: Radiology;  Laterality: N/A;    MEDICATIONS: . Artificial Tear Ointment (DRY EYES OP)  . aspirin EC 81 MG tablet  . atorvastatin (LIPITOR) 40 MG tablet  . citalopram (CELEXA) 40 MG tablet  . docusate sodium (COLACE) 100 MG capsule  . gabapentin (NEURONTIN) 300 MG capsule  . LORazepam (ATIVAN) 0.5 MG tablet  . methocarbamol (ROBAXIN) 750 MG tablet  . metoprolol tartrate (LOPRESSOR)  25 MG tablet  . omeprazole (PRILOSEC) 20 MG capsule  . oxyCODONE (OXY IR/ROXICODONE) 5 MG immediate release tablet  . oxyCODONE-acetaminophen (PERCOCET) 10-325 MG tablet  . triamterene-hydrochlorothiazide (MAXZIDE-25) 37.5-25 MG tablet   . Chlorhexidine Gluconate Cloth 2 % PADS 6 each   And  . Chlorhexidine Gluconate Cloth 2 % PADS 6 each    George Hugh [facility] Short Stay Center/Anesthesiology Phone [phone] 05/05/2018 5:28 PM

## 2018-05-05 NOTE — Progress Notes (Signed)
Ebony Hail pa notified regarding cardiac hx. Saw Koneswaran 2018 for cardiac clearance,had stress  test at that time.

## 2018-05-06 NOTE — Anesthesia Preprocedure Evaluation (Addendum)
Anesthesia Evaluation  Patient identified by MRN, date of birth, ID band Patient awake    Reviewed: Allergy & Precautions, NPO status , Patient's Chart, lab work & pertinent test results  History of Anesthesia Complications (+) Family history of anesthesia reaction  Airway Mallampati: II  TM Distance: >3 FB Neck ROM: Full    Dental no notable dental hx. (+) Upper Dentures, Poor Dentition   Pulmonary shortness of breath, COPD, Current Smoker,    Pulmonary exam normal breath sounds clear to auscultation       Cardiovascular hypertension, Pt. on home beta blockers and Pt. on medications Normal cardiovascular exam Rhythm:Regular Rate:Normal  Echo 2/18 Left ventricle: The cavity size was normal. Wall thickness was   normal. Systolic function was normal. The estimated ejection   fraction was in the range of 60% to 65%. Wall motion was normal;   there were no regional wall motion abnormalities.   Neuro/Psych  Headaches, R sided weakness  Neuromuscular disease CVA    GI/Hepatic Neg liver ROS, GERD  Medicated,  Endo/Other  negative endocrine ROS  Renal/GU Renal disease     Musculoskeletal   Abdominal   Peds  Hematology negative hematology ROS (+)   Anesthesia Other Findings   Reproductive/Obstetrics                           Anesthesia Physical Anesthesia Plan  ASA: III  Anesthesia Plan: General   Post-op Pain Management:    Induction: Intravenous  PONV Risk Score and Plan: 2 and Treatment may vary due to age or medical condition, Ondansetron and Dexamethasone  Airway Management Planned: Oral ETT and Video Laryngoscope Planned  Additional Equipment: Arterial line  Intra-op Plan:   Post-operative Plan: Extubation in OR  Informed Consent: I have reviewed the patients History and Physical, chart, labs and discussed the procedure including the risks, benefits and alternatives for the  proposed anesthesia with the patient or authorized representative who has indicated his/her understanding and acceptance.   Dental advisory given  Plan Discussed with:   Anesthesia Plan Comments: (See PAT note written 05/06/2018 by Myra Gianotti, PA-C.  )       Anesthesia Quick Evaluation                                  Anesthesia Evaluation  Patient identified by MRN, date of birth, ID band Patient awake    Reviewed: Allergy & Precautions, NPO status , Patient's Chart, lab work & pertinent test results  History of Anesthesia Complications Negative for: history of anesthetic complications  Airway Mallampati: III  TM Distance: <3 FB Neck ROM: Full    Dental  (+) Edentulous Lower, Edentulous Upper, Dental Advisory Given   Pulmonary asthma , COPD, Current Smoker,    breath sounds clear to auscultation       Cardiovascular hypertension, Pt. on home beta blockers and Pt. on medications (-) angina+ Peripheral Vascular Disease   Rhythm:Regular Rate:Normal   '19 Nuclear Stress - No diagnostic ST segment changes over baseline abnormalities. Medium sized, moderate intensity, mid to apical inferolateral and basal inferior/inferoseptal defects. There is reversibility noted in the mid to apical segment and partial reversibility in the basal segment. This is consistent with potential scar and mild to moderate ischemic territory. This is an intermediate risk study. Nuclear stress EF: 61%.  '19 Carotid Duplex - Right Carotid: Velocities in the right  ICA are consistent with a 80-99% stenosis. Left Carotid: Velocities in the left ICA are consistent with a 60-79% stenosis. Carotid body tumor measures 1.5cm AP, 1.6cm trans, 2.1cm long.  '18 TTE - EF 60% to 65%. Right atrium was mildly dilated.  From PAT - "Seen by cardiologist Dr. Bronson Ing on 11/18/17 for preoperative risk stratification. She reported occasional chest pains that could radiate down both arms if she's  had an active day. Symptoms could also be associated with SOB, lightheadedness, dizziness, and nausea. Given her symptoms, risk factors, and upcoming surgery, he ordered a stress test which was intermediate risk. Dr. Bronson Ing started her on b-blocker therapy to reduce her perioperative risk. He also switched her statin to Lipitor and started her on ASA."   Neuro/Psych  Headaches, Anxiety Depression CVA (right sided weakness), Residual Symptoms    GI/Hepatic Neg liver ROS, GERD  Controlled and Medicated,  Endo/Other   Obesity   Renal/GU negative Renal ROS  negative genitourinary   Musculoskeletal  (+) Arthritis ,   Abdominal   Peds  Hematology negative hematology ROS (+)   Anesthesia Other Findings   Reproductive/Obstetrics                           Anesthesia Physical Anesthesia Plan  ASA: III  Anesthesia Plan: General   Post-op Pain Management:    Induction: Intravenous  PONV Risk Score and Plan: 3 and Treatment may vary due to age or medical condition, Ondansetron and Dexamethasone  Airway Management Planned: Oral ETT  Additional Equipment: Arterial line  Intra-op Plan:   Post-operative Plan: Extubation in OR  Informed Consent: I have reviewed the patients History and Physical, chart, labs and discussed the procedure including the risks, benefits and alternatives for the proposed anesthesia with the patient or authorized representative who has indicated his/her understanding and acceptance.   Dental advisory given  Plan Discussed with: CRNA, Anesthesiologist and Surgeon  Anesthesia Plan Comments:       Anesthesia Quick Evaluation                                   Anesthesia Evaluation  Patient identified by MRN, date of birth, ID band Patient awake    Reviewed: Allergy & Precautions, NPO status , Patient's Chart, lab work & pertinent test results  History of Anesthesia Complications Negative for: history of anesthetic  complications  Airway Mallampati: III  TM Distance: <3 FB Neck ROM: Full    Dental  (+) Edentulous Lower, Edentulous Upper, Dental Advisory Given   Pulmonary asthma , COPD, Current Smoker,    breath sounds clear to auscultation       Cardiovascular hypertension, Pt. on home beta blockers and Pt. on medications (-) angina+ Peripheral Vascular Disease   Rhythm:Regular Rate:Normal   '19 Nuclear Stress - No diagnostic ST segment changes over baseline abnormalities. Medium sized, moderate intensity, mid to apical inferolateral and basal inferior/inferoseptal defects. There is reversibility noted in the mid to apical segment and partial reversibility in the basal segment. This is consistent with potential scar and mild to moderate ischemic territory. This is an intermediate risk study. Nuclear stress EF: 61%.  '19 Carotid Duplex - Right Carotid: Velocities in the right ICA are consistent with a 80-99% stenosis. Left Carotid: Velocities in the left ICA are consistent with a 60-79% stenosis. Carotid body tumor measures 1.5cm AP, 1.6cm trans, 2.1cm long.  '  18 TTE - EF 60% to 65%. Right atrium was mildly dilated.  From PAT - "Seen by cardiologist Dr. Bronson Ing on 11/18/17 for preoperative risk stratification. She reported occasional chest pains that could radiate down both arms if she's had an active day. Symptoms could also be associated with SOB, lightheadedness, dizziness, and nausea. Given her symptoms, risk factors, and upcoming surgery, he ordered a stress test which was intermediate risk. Dr. Bronson Ing started her on b-blocker therapy to reduce her perioperative risk. He also switched her statin to Lipitor and started her on ASA."   Neuro/Psych  Headaches, Anxiety Depression CVA (right sided weakness), Residual Symptoms    GI/Hepatic Neg liver ROS, GERD  Controlled and Medicated,  Endo/Other   Obesity   Renal/GU negative Renal ROS  negative genitourinary    Musculoskeletal  (+) Arthritis ,   Abdominal   Peds  Hematology negative hematology ROS (+)   Anesthesia Other Findings   Reproductive/Obstetrics                           Anesthesia Physical Anesthesia Plan  ASA: III  Anesthesia Plan: General   Post-op Pain Management:    Induction: Intravenous  PONV Risk Score and Plan: 3 and Treatment may vary due to age or medical condition, Ondansetron and Dexamethasone  Airway Management Planned: Oral ETT  Additional Equipment: Arterial line  Intra-op Plan:   Post-operative Plan: Extubation in OR  Informed Consent: I have reviewed the patients History and Physical, chart, labs and discussed the procedure including the risks, benefits and alternatives for the proposed anesthesia with the patient or authorized representative who has indicated his/her understanding and acceptance.   Dental advisory given  Plan Discussed with: CRNA, Anesthesiologist and Surgeon  Anesthesia Plan Comments:       Anesthesia Quick Evaluation

## 2018-05-09 ENCOUNTER — Inpatient Hospital Stay (HOSPITAL_COMMUNITY)
Admission: RE | Admit: 2018-05-09 | Discharge: 2018-05-10 | DRG: 038 | Disposition: A | Discharge status: Home / Self Care | Payer: Medicare HMO | Source: Ambulatory Visit | Attending: Surgery | Admitting: Surgery

## 2018-05-09 ENCOUNTER — Inpatient Hospital Stay (HOSPITAL_COMMUNITY): Payer: Medicare HMO | Admitting: Vascular Surgery

## 2018-05-09 ENCOUNTER — Encounter (HOSPITAL_COMMUNITY)
Admission: RE | Disposition: A | Discharge status: Home / Self Care | Payer: Self-pay | Source: Ambulatory Visit | Attending: Surgery

## 2018-05-09 ENCOUNTER — Other Ambulatory Visit: Payer: Self-pay

## 2018-05-09 ENCOUNTER — Encounter (HOSPITAL_COMMUNITY): Payer: Self-pay

## 2018-05-09 DIAGNOSIS — I700 Atherosclerosis of aorta: Secondary | ICD-10-CM | POA: Diagnosis not present

## 2018-05-09 DIAGNOSIS — Z853 Personal history of malignant neoplasm of breast: Secondary | ICD-10-CM

## 2018-05-09 DIAGNOSIS — I69351 Hemiplegia and hemiparesis following cerebral infarction affecting right dominant side: Secondary | ICD-10-CM

## 2018-05-09 DIAGNOSIS — E7800 Pure hypercholesterolemia, unspecified: Secondary | ICD-10-CM | POA: Diagnosis present

## 2018-05-09 DIAGNOSIS — R0602 Shortness of breath: Secondary | ICD-10-CM | POA: Diagnosis not present

## 2018-05-09 DIAGNOSIS — Z7982 Long term (current) use of aspirin: Secondary | ICD-10-CM

## 2018-05-09 DIAGNOSIS — Z9221 Personal history of antineoplastic chemotherapy: Secondary | ICD-10-CM

## 2018-05-09 DIAGNOSIS — Z79899 Other long term (current) drug therapy: Secondary | ICD-10-CM | POA: Diagnosis not present

## 2018-05-09 DIAGNOSIS — J449 Chronic obstructive pulmonary disease, unspecified: Secondary | ICD-10-CM | POA: Diagnosis present

## 2018-05-09 DIAGNOSIS — I6521 Occlusion and stenosis of right carotid artery: Principal | ICD-10-CM | POA: Diagnosis present

## 2018-05-09 DIAGNOSIS — H9190 Unspecified hearing loss, unspecified ear: Secondary | ICD-10-CM | POA: Diagnosis present

## 2018-05-09 DIAGNOSIS — K219 Gastro-esophageal reflux disease without esophagitis: Secondary | ICD-10-CM | POA: Diagnosis present

## 2018-05-09 DIAGNOSIS — I10 Essential (primary) hypertension: Secondary | ICD-10-CM | POA: Diagnosis present

## 2018-05-09 DIAGNOSIS — Z974 Presence of external hearing-aid: Secondary | ICD-10-CM | POA: Diagnosis not present

## 2018-05-09 DIAGNOSIS — Z23 Encounter for immunization: Secondary | ICD-10-CM

## 2018-05-09 DIAGNOSIS — F17210 Nicotine dependence, cigarettes, uncomplicated: Secondary | ICD-10-CM | POA: Diagnosis present

## 2018-05-09 DIAGNOSIS — I6503 Occlusion and stenosis of bilateral vertebral arteries: Secondary | ICD-10-CM | POA: Diagnosis present

## 2018-05-09 DIAGNOSIS — F329 Major depressive disorder, single episode, unspecified: Secondary | ICD-10-CM | POA: Diagnosis present

## 2018-05-09 DIAGNOSIS — R4701 Aphasia: Secondary | ICD-10-CM | POA: Diagnosis present

## 2018-05-09 HISTORY — PX: CAROTID ENDARTERECTOMY: SUR193

## 2018-05-09 HISTORY — PX: ENDARTERECTOMY: SHX5162

## 2018-05-09 SURGERY — ENDARTERECTOMY, CAROTID
Anesthesia: General | Site: Neck | Laterality: Right

## 2018-05-09 MED ORDER — PROPOFOL 10 MG/ML IV BOLUS
INTRAVENOUS | Status: DC | PRN
  Administered 2018-05-09: 120 mg via INTRAVENOUS

## 2018-05-09 MED ORDER — SUGAMMADEX SODIUM 200 MG/2ML IV SOLN
INTRAVENOUS | Status: DC | PRN
  Administered 2018-05-09: 160 mg via INTRAVENOUS

## 2018-05-09 MED ORDER — LABETALOL HCL 5 MG/ML IV SOLN: 10.0000 mg | INTRAVENOUS | Status: DC | PRN

## 2018-05-09 MED ORDER — BISACODYL 10 MG RE SUPP: 10.0000 mg | Freq: Every day | RECTAL | Status: DC | PRN

## 2018-05-09 MED ORDER — GABAPENTIN 300 MG PO CAPS
300.0000 mg | ORAL_CAPSULE | Freq: Three times a day (TID) | ORAL | Status: DC
  Administered 2018-05-09 – 2018-05-10 (×3): 300 mg via ORAL
  Filled 2018-05-09 (×3): qty 1

## 2018-05-09 MED ORDER — DOCUSATE SODIUM 100 MG PO CAPS: 100.0000 mg | ORAL_CAPSULE | Freq: Every day | ORAL | Status: DC | PRN

## 2018-05-09 MED ORDER — LACTATED RINGERS IV SOLN
INTRAVENOUS | Status: DC | PRN
  Administered 2018-05-09: 11:00:00 via INTRAVENOUS

## 2018-05-09 MED ORDER — ATORVASTATIN CALCIUM 40 MG PO TABS
40.0000 mg | ORAL_TABLET | Freq: Every day | ORAL | Status: DC
  Administered 2018-05-09 – 2018-05-10 (×2): 40 mg via ORAL
  Filled 2018-05-09 (×2): qty 1

## 2018-05-09 MED ORDER — ACETAMINOPHEN 325 MG RE SUPP: 325.0000 mg | RECTAL | Status: DC | PRN

## 2018-05-09 MED ORDER — LORAZEPAM 0.5 MG PO TABS: 0.5000 mg | ORAL_TABLET | Freq: Three times a day (TID) | ORAL | Status: DC | PRN

## 2018-05-09 MED ORDER — LIDOCAINE 2% (20 MG/ML) 5 ML SYRINGE
INTRAMUSCULAR | Status: DC | PRN
  Administered 2018-05-09: 100 mg via INTRAVENOUS

## 2018-05-09 MED ORDER — LIDOCAINE HCL (PF) 1 % IJ SOLN
INTRAMUSCULAR | Status: AC
  Filled 2018-05-09: qty 30

## 2018-05-09 MED ORDER — METHOCARBAMOL 500 MG PO TABS: 750.0000 mg | ORAL_TABLET | Freq: Three times a day (TID) | ORAL | Status: DC | PRN

## 2018-05-09 MED ORDER — SODIUM CHLORIDE 0.9 % IV SOLN: INTRAVENOUS | Status: DC

## 2018-05-09 MED ORDER — EPHEDRINE 5 MG/ML INJ
INTRAVENOUS | Status: AC
  Filled 2018-05-09: qty 10

## 2018-05-09 MED ORDER — INFLUENZA VAC SPLIT HIGH-DOSE 0.5 ML IM SUSY
0.5000 mL | PREFILLED_SYRINGE | INTRAMUSCULAR | Status: AC
  Administered 2018-05-10: 0.5 mL via INTRAMUSCULAR
  Filled 2018-05-09: qty 0.5

## 2018-05-09 MED ORDER — ONDANSETRON HCL 4 MG/2ML IJ SOLN
INTRAMUSCULAR | Status: DC | PRN
  Administered 2018-05-09: 4 mg via INTRAVENOUS

## 2018-05-09 MED ORDER — PROTAMINE SULFATE 10 MG/ML IV SOLN
INTRAVENOUS | Status: DC | PRN
  Administered 2018-05-09: 50 mg via INTRAVENOUS

## 2018-05-09 MED ORDER — PROTAMINE SULFATE 10 MG/ML IV SOLN
INTRAVENOUS | Status: AC
  Filled 2018-05-09: qty 5

## 2018-05-09 MED ORDER — OXYCODONE-ACETAMINOPHEN 10-325 MG PO TABS: 1.0000 | ORAL_TABLET | Freq: Three times a day (TID) | ORAL | Status: DC | PRN

## 2018-05-09 MED ORDER — HEPARIN SODIUM (PORCINE) 1000 UNIT/ML IJ SOLN: INTRAMUSCULAR | Status: DC | PRN

## 2018-05-09 MED ORDER — POLYETHYLENE GLYCOL 3350 17 G PO PACK: 17.0000 g | PACK | Freq: Every day | ORAL | Status: DC | PRN

## 2018-05-09 MED ORDER — ONDANSETRON HCL 4 MG/2ML IJ SOLN: 4.0000 mg | Freq: Four times a day (QID) | INTRAMUSCULAR | Status: DC | PRN

## 2018-05-09 MED ORDER — DEXAMETHASONE SODIUM PHOSPHATE 10 MG/ML IJ SOLN
INTRAMUSCULAR | Status: DC | PRN
  Administered 2018-05-09: 10 mg via INTRAVENOUS

## 2018-05-09 MED ORDER — PHENOL 1.4 % MT LIQD: 1.0000 | OROMUCOSAL | Status: DC | PRN

## 2018-05-09 MED ORDER — ROCURONIUM BROMIDE 50 MG/5ML IV SOSY
PREFILLED_SYRINGE | INTRAVENOUS | Status: AC
  Filled 2018-05-09: qty 5

## 2018-05-09 MED ORDER — SODIUM CHLORIDE 0.9 % IV SOLN
INTRAVENOUS | Status: AC
  Filled 2018-05-09: qty 1.2

## 2018-05-09 MED ORDER — ACETAMINOPHEN 10 MG/ML IV SOLN
INTRAVENOUS | Status: AC
  Administered 2018-05-09: 1000 mg via INTRAVENOUS
  Filled 2018-05-09: qty 100

## 2018-05-09 MED ORDER — PROPOFOL 10 MG/ML IV BOLUS
INTRAVENOUS | Status: AC
  Filled 2018-05-09: qty 20

## 2018-05-09 MED ORDER — DOCUSATE SODIUM 100 MG PO CAPS
100.0000 mg | ORAL_CAPSULE | Freq: Every day | ORAL | Status: DC
  Administered 2018-05-10: 100 mg via ORAL
  Filled 2018-05-09: qty 1

## 2018-05-09 MED ORDER — POTASSIUM CHLORIDE CRYS ER 20 MEQ PO TBCR: 20.0000 meq | EXTENDED_RELEASE_TABLET | Freq: Every day | ORAL | Status: DC | PRN

## 2018-05-09 MED ORDER — FENTANYL CITRATE (PF) 250 MCG/5ML IJ SOLN
INTRAMUSCULAR | Status: AC
  Filled 2018-05-09: qty 5

## 2018-05-09 MED ORDER — HEPARIN SODIUM (PORCINE) 1000 UNIT/ML IJ SOLN
INTRAMUSCULAR | Status: DC | PRN
  Administered 2018-05-09: 1000 [IU] via INTRAVENOUS
  Administered 2018-05-09: 8000 [IU] via INTRAVENOUS

## 2018-05-09 MED ORDER — ALBUTEROL SULFATE HFA 108 (90 BASE) MCG/ACT IN AERS
INHALATION_SPRAY | RESPIRATORY_TRACT | Status: AC
  Filled 2018-05-09: qty 6.7

## 2018-05-09 MED ORDER — LACTATED RINGERS IV SOLN
INTRAVENOUS | Status: DC
  Administered 2018-05-09: 11:00:00 via INTRAVENOUS

## 2018-05-09 MED ORDER — SODIUM CHLORIDE 0.9 % IV SOLN: 500.0000 mL | Freq: Once | INTRAVENOUS | Status: DC | PRN

## 2018-05-09 MED ORDER — OXYCODONE HCL 5 MG PO TABS
5.0000 mg | ORAL_TABLET | Freq: Three times a day (TID) | ORAL | Status: DC | PRN
  Administered 2018-05-09 – 2018-05-10 (×2): 5 mg via ORAL
  Filled 2018-05-09 (×2): qty 1

## 2018-05-09 MED ORDER — ROCURONIUM BROMIDE 50 MG/5ML IV SOSY
PREFILLED_SYRINGE | INTRAVENOUS | Status: AC
  Filled 2018-05-09: qty 10

## 2018-05-09 MED ORDER — ONDANSETRON HCL 4 MG/2ML IJ SOLN
INTRAMUSCULAR | Status: AC
  Filled 2018-05-09: qty 2

## 2018-05-09 MED ORDER — CEFAZOLIN SODIUM-DEXTROSE 2-4 GM/100ML-% IV SOLN
2.0000 g | INTRAVENOUS | Status: AC
  Administered 2018-05-09: 2 g via INTRAVENOUS
  Filled 2018-05-09: qty 100

## 2018-05-09 MED ORDER — 0.9 % SODIUM CHLORIDE (POUR BTL) OPTIME
TOPICAL | Status: DC | PRN
  Administered 2018-05-09: 2000 mL

## 2018-05-09 MED ORDER — HEMOSTATIC AGENTS (NO CHARGE) OPTIME
TOPICAL | Status: DC | PRN
  Administered 2018-05-09: 1 via TOPICAL

## 2018-05-09 MED ORDER — METOPROLOL TARTRATE 25 MG PO TABS
25.0000 mg | ORAL_TABLET | Freq: Two times a day (BID) | ORAL | Status: DC
  Administered 2018-05-09 – 2018-05-10 (×2): 25 mg via ORAL
  Filled 2018-05-09 (×2): qty 1

## 2018-05-09 MED ORDER — ROCURONIUM BROMIDE 50 MG/5ML IV SOSY
PREFILLED_SYRINGE | INTRAVENOUS | Status: DC | PRN
  Administered 2018-05-09: 10 mg via INTRAVENOUS
  Administered 2018-05-09: 50 mg via INTRAVENOUS

## 2018-05-09 MED ORDER — PHENYLEPHRINE HCL 10 MG/ML IJ SOLN
INTRAMUSCULAR | Status: DC | PRN
  Administered 2018-05-09: 40 ug via INTRAVENOUS
  Administered 2018-05-09 (×2): 120 ug via INTRAVENOUS

## 2018-05-09 MED ORDER — MAGNESIUM SULFATE 2 GM/50ML IV SOLN: 2.0000 g | Freq: Every day | INTRAVENOUS | Status: DC | PRN

## 2018-05-09 MED ORDER — ALBUTEROL SULFATE HFA 108 (90 BASE) MCG/ACT IN AERS
INHALATION_SPRAY | RESPIRATORY_TRACT | Status: DC | PRN
  Administered 2018-05-09 (×2): 2 via RESPIRATORY_TRACT

## 2018-05-09 MED ORDER — FENTANYL CITRATE (PF) 100 MCG/2ML IJ SOLN
INTRAMUSCULAR | Status: AC
  Filled 2018-05-09: qty 2

## 2018-05-09 MED ORDER — DEXAMETHASONE SODIUM PHOSPHATE 10 MG/ML IJ SOLN
INTRAMUSCULAR | Status: AC
  Filled 2018-05-09: qty 1

## 2018-05-09 MED ORDER — ACETAMINOPHEN 10 MG/ML IV SOLN
1000.0000 mg | Freq: Once | INTRAVENOUS | Status: DC | PRN
  Administered 2018-05-09: 1000 mg via INTRAVENOUS

## 2018-05-09 MED ORDER — PANTOPRAZOLE SODIUM 40 MG PO TBEC
40.0000 mg | DELAYED_RELEASE_TABLET | Freq: Every day | ORAL | Status: DC
  Administered 2018-05-10: 40 mg via ORAL
  Filled 2018-05-09: qty 1

## 2018-05-09 MED ORDER — FENTANYL CITRATE (PF) 100 MCG/2ML IJ SOLN
INTRAMUSCULAR | Status: DC | PRN
  Administered 2018-05-09 (×2): 50 ug via INTRAVENOUS

## 2018-05-09 MED ORDER — ASPIRIN EC 81 MG PO TBEC
81.0000 mg | DELAYED_RELEASE_TABLET | Freq: Every day | ORAL | Status: DC
  Administered 2018-05-10: 81 mg via ORAL
  Filled 2018-05-09 (×2): qty 1

## 2018-05-09 MED ORDER — ONDANSETRON HCL 4 MG/2ML IJ SOLN: 4.0000 mg | Freq: Once | INTRAMUSCULAR | Status: DC | PRN

## 2018-05-09 MED ORDER — ALUM & MAG HYDROXIDE-SIMETH 200-200-20 MG/5ML PO SUSP: 15.0000 mL | ORAL | Status: DC | PRN

## 2018-05-09 MED ORDER — MORPHINE SULFATE (PF) 2 MG/ML IV SOLN
2.0000 mg | INTRAVENOUS | Status: DC | PRN
  Administered 2018-05-10: 2 mg via INTRAVENOUS
  Filled 2018-05-09: qty 1

## 2018-05-09 MED ORDER — LIDOCAINE 2% (20 MG/ML) 5 ML SYRINGE
INTRAMUSCULAR | Status: AC
  Filled 2018-05-09: qty 5

## 2018-05-09 MED ORDER — FENTANYL CITRATE (PF) 100 MCG/2ML IJ SOLN: 25.0000 ug | INTRAMUSCULAR | Status: DC | PRN

## 2018-05-09 MED ORDER — CITALOPRAM HYDROBROMIDE 20 MG PO TABS
40.0000 mg | ORAL_TABLET | Freq: Every day | ORAL | Status: DC
  Administered 2018-05-10: 40 mg via ORAL
  Filled 2018-05-09: qty 2

## 2018-05-09 MED ORDER — SODIUM CHLORIDE 0.9 % IV SOLN
INTRAVENOUS | Status: DC | PRN
  Administered 2018-05-09: 40 ug/min via INTRAVENOUS

## 2018-05-09 MED ORDER — HYDRALAZINE HCL 20 MG/ML IJ SOLN: 5.0000 mg | INTRAMUSCULAR | Status: DC | PRN

## 2018-05-09 MED ORDER — SODIUM CHLORIDE 0.9 % IV SOLN
INTRAVENOUS | Status: DC | PRN
  Administered 2018-05-09: 10:00:00

## 2018-05-09 MED ORDER — CEFAZOLIN SODIUM-DEXTROSE 2-4 GM/100ML-% IV SOLN
2.0000 g | Freq: Three times a day (TID) | INTRAVENOUS | Status: AC
  Administered 2018-05-09 – 2018-05-10 (×2): 2 g via INTRAVENOUS
  Filled 2018-05-09 (×2): qty 100

## 2018-05-09 MED ORDER — ACETAMINOPHEN 325 MG PO TABS
325.0000 mg | ORAL_TABLET | ORAL | Status: DC | PRN
  Administered 2018-05-09: 650 mg via ORAL
  Filled 2018-05-09: qty 2

## 2018-05-09 MED ORDER — OXYCODONE-ACETAMINOPHEN 5-325 MG PO TABS
1.0000 | ORAL_TABLET | Freq: Three times a day (TID) | ORAL | Status: DC | PRN
  Administered 2018-05-09 – 2018-05-10 (×2): 1 via ORAL
  Filled 2018-05-09 (×2): qty 1

## 2018-05-09 MED ORDER — GUAIFENESIN-DM 100-10 MG/5ML PO SYRP: 15.0000 mL | ORAL_SOLUTION | ORAL | Status: DC | PRN

## 2018-05-09 MED ORDER — SODIUM CHLORIDE 0.9 % IV SOLN
INTRAVENOUS | Status: DC
  Administered 2018-05-09: 17:00:00 via INTRAVENOUS

## 2018-05-09 MED ORDER — LABETALOL HCL 5 MG/ML IV SOLN
INTRAVENOUS | Status: AC
  Filled 2018-05-09: qty 4

## 2018-05-09 MED ORDER — METOPROLOL TARTRATE 5 MG/5ML IV SOLN: 2.0000 mg | INTRAVENOUS | Status: DC | PRN

## 2018-05-09 MED ORDER — TRIAMTERENE-HCTZ 37.5-25 MG PO TABS
1.0000 | ORAL_TABLET | Freq: Every day | ORAL | Status: DC
  Administered 2018-05-09 – 2018-05-10 (×2): 1 via ORAL
  Filled 2018-05-09 (×2): qty 1

## 2018-05-09 MED ORDER — SODIUM CHLORIDE 0.9 % IV SOLN
0.0125 ug/kg/min | INTRAVENOUS | Status: AC
  Administered 2018-05-09: .2 ug/kg/min via INTRAVENOUS
  Filled 2018-05-09: qty 2000

## 2018-05-09 SURGICAL SUPPLY — 50 items
ADH SKN CLS APL DERMABOND .7 (GAUZE/BANDAGES/DRESSINGS) ×1
CANISTER SUCT 3000ML PPV (MISCELLANEOUS) ×3 IMPLANT
CATH ROBINSON RED A/P 18FR (CATHETERS) ×3 IMPLANT
CATH SUCT 10FR WHISTLE TIP (CATHETERS) ×3 IMPLANT
CLIP VESOCCLUDE MED 6/CT (CLIP) ×3 IMPLANT
CLIP VESOCCLUDE SM WIDE 6/CT (CLIP) ×3 IMPLANT
COVER PROBE W GEL 5X96 (DRAPES) IMPLANT
COVER WAND RF STERILE (DRAPES) ×3 IMPLANT
CRADLE DONUT ADULT HEAD (MISCELLANEOUS) ×3 IMPLANT
DERMABOND ADVANCED (GAUZE/BANDAGES/DRESSINGS) ×2
DERMABOND ADVANCED .7 DNX12 (GAUZE/BANDAGES/DRESSINGS) ×1 IMPLANT
DRAIN CHANNEL 15F RND FF W/TCR (WOUND CARE) IMPLANT
ELECT REM PT RETURN 9FT ADLT (ELECTROSURGICAL) ×3
ELECTRODE REM PT RTRN 9FT ADLT (ELECTROSURGICAL) ×1 IMPLANT
EVACUATOR SILICONE 100CC (DRAIN) IMPLANT
GLOVE BIOGEL PI IND STRL 6.5 (GLOVE) IMPLANT
GLOVE BIOGEL PI IND STRL 7.5 (GLOVE) ×1 IMPLANT
GLOVE BIOGEL PI INDICATOR 6.5 (GLOVE) ×2
GLOVE BIOGEL PI INDICATOR 7.5 (GLOVE) ×2
GLOVE SURG SS PI 6.5 STRL IVOR (GLOVE) ×2 IMPLANT
GLOVE SURG SS PI 7.5 STRL IVOR (GLOVE) ×3 IMPLANT
GOWN STRL REUS W/ TWL LRG LVL3 (GOWN DISPOSABLE) ×2 IMPLANT
GOWN STRL REUS W/ TWL XL LVL3 (GOWN DISPOSABLE) ×1 IMPLANT
GOWN STRL REUS W/TWL LRG LVL3 (GOWN DISPOSABLE) ×9
GOWN STRL REUS W/TWL XL LVL3 (GOWN DISPOSABLE) ×3
HEMOSTAT SNOW SURGICEL 2X4 (HEMOSTASIS) ×2 IMPLANT
INSERT FOGARTY SM (MISCELLANEOUS) IMPLANT
KIT BASIN OR (CUSTOM PROCEDURE TRAY) ×3 IMPLANT
KIT SHUNT ARGYLE CAROTID ART 6 (VASCULAR PRODUCTS) IMPLANT
KIT TURNOVER KIT B (KITS) ×3 IMPLANT
NDL HYPO 25GX1X1/2 BEV (NEEDLE) IMPLANT
NEEDLE HYPO 25GX1X1/2 BEV (NEEDLE) IMPLANT
NS IRRIG 1000ML POUR BTL (IV SOLUTION) ×9 IMPLANT
PACK CAROTID (CUSTOM PROCEDURE TRAY) ×3 IMPLANT
PAD ARMBOARD 7.5X6 YLW CONV (MISCELLANEOUS) ×6 IMPLANT
PATCH VASC XENOSURE 1CMX6CM (Vascular Products) ×3 IMPLANT
PATCH VASC XENOSURE 1X6 (Vascular Products) IMPLANT
SHUNT CAROTID BYPASS 10 (VASCULAR PRODUCTS) IMPLANT
SHUNT CAROTID BYPASS 12FRX15.5 (VASCULAR PRODUCTS) IMPLANT
SUT ETHILON 3 0 PS 1 (SUTURE) IMPLANT
SUT PROLENE 6 0 BV (SUTURE) ×7 IMPLANT
SUT PROLENE 7 0 BV 1 (SUTURE) IMPLANT
SUT SILK 3 0 (SUTURE)
SUT SILK 3-0 18XBRD TIE 12 (SUTURE) IMPLANT
SUT VIC AB 3-0 SH 27 (SUTURE) ×6
SUT VIC AB 3-0 SH 27X BRD (SUTURE) ×2 IMPLANT
SUT VICRYL 4-0 PS2 18IN ABS (SUTURE) ×3 IMPLANT
SYR CONTROL 10ML LL (SYRINGE) IMPLANT
TOWEL GREEN STERILE (TOWEL DISPOSABLE) ×3 IMPLANT
WATER STERILE IRR 1000ML POUR (IV SOLUTION) ×3 IMPLANT

## 2018-05-09 NOTE — Progress Notes (Signed)
  Day of Surgery Note    Subjective:  C/o pain right neck; denies trouble swallowing   Vitals:   05/09/18 1330 05/09/18 1345  BP: (!) 98/55 (!) 103/53  Pulse: 70 66  Resp: 20 19  Temp:    SpO2: 91% 94%    Incisions:   Clean and dry with moderate swelling around incision Extremities:  Moving all extremities equally Cardiac:  regular Lungs:  Non labored Neuro:  In tact; Tongue midline    Assessment/Plan:  This is a 72 y.o. female who is s/p  Right carotid endarterectomy  -pt neuro in tact; tongue is midline  -she does have moderate swelling around the incision with pain.  Her oxygen saturations mid 90's on 3LO2NC.  Denies trouble swallowing.  Will keep in pacu until evaluated by MD.    Leontine Locket, PA-C 05/09/2018 2:09 PM [phone]

## 2018-05-09 NOTE — Progress Notes (Signed)
Dr. Trula Slade at bedside to evaluate right incisional swelling. No actions to be taken now. Continue to monitor on floor. Okay to leave PACU.

## 2018-05-09 NOTE — Op Note (Signed)
Patient name: Ashley Doyle MRN: [id number] DOB: 1945-07-04 Sex: female  05/09/2018 Pre-operative Diagnosis: Asymptomatic   right carotid stenosis Post-operative diagnosis:  Same Surgeon:  Annamarie Major Assistants:  Leontine Locket Procedure:    right carotid Endarterectomy with bovine pericardial  patch angioplasty Anesthesia:  General Blood Loss:  100 Specimens: none  Findings:  90 %stenosis; Thrombus:  none  Indications:  72 yo recently s/p left CEA with carotid body tumor removal.  She is here today for right CEA  Procedure:  The patient was identified in the holding area and taken to [place] [address]  The patient was then placed supine on the table.   General endotrachial anesthesia was administered.  The patient was prepped and draped in the usual sterile fashion.  A time out was called and antibiotics were administered.  The incision was made along the anterior border of the right sternocleidomastoid muscle.  Cautery was used to dissect through the subcutaneous tissue.  The platysma muscle was divided with cautery.  The internal jugular vein was exposed along its anterior medial border.  The common facial vein was exposed and then divided between 2-0 silk ties and metal clips.  The common carotid artery was then circumferentially exposed and encircled with an umbilical tape.  The vagus nerve was identified and protected.  Next sharp dissection was used to expose the external carotid artery and the superior thyroid artery.  The were encircled with a blue vessel loop and a 2-0 silk tie respectively.  Finally, the internal carotid was carefully dissected free.  An umbilical tape was placed around the internal carotid artery distal to the diseased segment.  The hypoglossal nerve was visualized throughout and protected.  The patient was given systemic heparinization.  A bovine carotid patch was selected and prepared on the back table.  A 10 french shunt was also prepared.  After blood pressure  readings were appropriate and the heparin had been given time to circulate, the internal carotid artery was occluded with a baby Gregory clamp.  The external and common carotid arteries were then occluded with vascular clamps and the 2-0 tie tightened on the superior thyroid artery.  A #11 blade was used to make an arteriotomy in the common carotid artery.  This was extended with Potts scissors along the anterior and lateral border of the common and internal carotid artery.  Approximately 90% stenosis was identified.  There was no thrombus identified.  The 10 french shunt was not placed, as the lesion was very high and there was pulsatile backbleeding.  A kleiner kuntz elevator was used to perform endarterectomy.  An eversion endarterectomy was performed in the external carotid artery.  A good distal endpoint was obtained in the internal carotid artery.  The specimen was removed and sent to pathology.  Heparinized saline was used to irrigate the endarterectomized field.  All potential embolic debris was removed.  Bovine pericardial patch angioplasty was then performed using a running 6-0 Prolene  The common internal and external carotid arteries were all appropriately flushed. The artery was again irrigated with heparin saline.  The anastomosis was then secured. The clamp was first released on the external carotid artery followed by the common carotid artery approximately 30 seconds later, bloodflow was reestablish through the internal carotid artery.  Next, a hand-held  Doppler was used to evaluate the signals in the common, external, and internal  carotid arteries, all of which had appropriate signals. I then administered  50 mg protamine. The  wound was then irrigated.  After hemostasis was achieved, the carotid sheath was reapproximated with 3-0 Vicryl. The  platysma muscle was reapproximated with running 3-0 Vicryl. The skin  was closed with 4-0 Vicryl. Dermabond was placed on the skin. The  patient was  then successfully extubated. Her neurologic exam was  similar to his preprocedural exam. The patient was then taken to recovery room  in stable condition. There were no complications.     Disposition:  To PACU in stable condition.  Relevant Operative Details:  90% stenosis which went well into the ICA, approximately 3cm.  Hypoglossal had to be mobilized to get to the distal ICA.  I ligated the occipital artery to help with exposure.  No shunt was utilized  V. Annamarie Major, M.D. [facility] [place] Office: [phone] Pager:  [phone]

## 2018-05-09 NOTE — Anesthesia Procedure Notes (Signed)
Arterial Line Insertion Start/End12/27/2019 10:00 AM, 05/09/2018 10:25 AM Performed by: Lance Coon, CRNA, CRNA  Patient location: Pre-op. Preanesthetic checklist: patient identified, IV checked, site marked, risks and benefits discussed, surgical consent, monitors and equipment checked, pre-op evaluation, timeout performed and anesthesia consent Lidocaine 1% used for infiltration Left, radial was placed Catheter size: 20 G Hand hygiene performed , maximum sterile barriers used  and Seldinger technique used  Attempts: 4 Procedure performed without using ultrasound guided technique. Following insertion, dressing applied and Biopatch. Post procedure assessment: normal and unchanged  Patient tolerated the procedure with difficulty.

## 2018-05-09 NOTE — Transfer of Care (Signed)
Immediate Anesthesia Transfer of Care Note  Patient: Ashley Doyle  Procedure(s) Performed: ENDARTERECTOMY CAROTID RIGHT (Right Neck)  Patient Location: PACU  Anesthesia Type:General  Level of Consciousness: awake and patient cooperative  Airway & Oxygen Therapy: Patient Spontanous Breathing and Patient connected to nasal cannula oxygen  Post-op Assessment: Report given to RN, Post -op Vital signs reviewed and stable and Patient moving all extremities X 4  Post vital signs: Reviewed and stable  Last Vitals:  Vitals Value Taken Time  BP 149/77 05/09/2018  1:01 PM  Temp 36.5 C 05/09/2018  1:01 PM  Pulse 79 05/09/2018  1:05 PM  Resp 29 05/09/2018  1:05 PM  SpO2 96 % 05/09/2018  1:05 PM  Vitals shown include unvalidated device data.  Last Pain:  Vitals:   05/09/18 0935  TempSrc:   PainSc: 0-No pain         Complications: No apparent anesthesia complications

## 2018-05-09 NOTE — Interval H&P Note (Signed)
History and Physical Interval Note:  05/09/2018 9:35 AM  Ashley Doyle  has presented today for surgery, with the diagnosis of RIGHT CAROTID STENOSIS  The various methods of treatment have been discussed with the patient and family. After consideration of risks, benefits and other options for treatment, the patient has consented to  Procedure(s): ENDARTERECTOMY CAROTID RIGHT (Right) as a surgical intervention .  The patient's history has been reviewed, patient examined, no change in status, stable for surgery.  I have reviewed the patient's chart and labs.  Questions were answered to the patient's satisfaction.     Annamarie Major

## 2018-05-09 NOTE — Progress Notes (Signed)
Pt arrived to 4e from PACU. Pt and pt's husband oriented to room and staff. Neck swollen from carotid procedure. Surgeon has assessed pt. Pt exhibits no difficulty swallowing. Neuro intact. Vitals obtained and stable. Telemetry applied and CCMD notified. CHG bath completed. Will continue current plan of care.   Ara Kussmaul BSN, RN

## 2018-05-09 NOTE — Anesthesia Postprocedure Evaluation (Signed)
Anesthesia Post Note  Patient: Ashley Doyle  Procedure(s) Performed: ENDARTERECTOMY CAROTID RIGHT (Right Neck)     Patient location during evaluation: PACU Anesthesia Type: General Level of consciousness: awake and alert Pain management: pain level controlled Vital Signs Assessment: post-procedure vital signs reviewed and stable Respiratory status: spontaneous breathing, nonlabored ventilation, respiratory function stable and patient connected to nasal cannula oxygen Cardiovascular status: blood pressure returned to baseline and stable Postop Assessment: no apparent nausea or vomiting Anesthetic complications: no    Last Vitals:  Vitals:   05/09/18 1600 05/09/18 1614  BP: 117/65 112/63  Pulse: 69 66  Resp: 15   Temp:  36.8 C  SpO2: 93% 96%    Last Pain:  Vitals:   05/09/18 1614  TempSrc: Oral  PainSc:                  Barnet Glasgow

## 2018-05-09 NOTE — Anesthesia Procedure Notes (Signed)
Procedure Name: Intubation Date/Time: 05/09/2018 11:04 AM Performed by: Lance Coon, CRNA Pre-anesthesia Checklist: Patient identified, Emergency Drugs available, Suction available, Patient being monitored and Timeout performed Patient Re-evaluated:Patient Re-evaluated prior to induction Oxygen Delivery Method: Circle system utilized Preoxygenation: Pre-oxygenation with 100% oxygen Induction Type: IV induction Ventilation: Mask ventilation without difficulty and Oral airway inserted - appropriate to patient size Laryngoscope Size: Sabra Heck and 2 Grade View: Grade I Tube type: Oral Tube size: 7.0 mm Number of attempts: 1 Airway Equipment and Method: Stylet Placement Confirmation: ETT inserted through vocal cords under direct vision,  positive ETCO2 and breath sounds checked- equal and bilateral Secured at: 21 cm Tube secured with: Tape Dental Injury: Teeth and Oropharynx as per pre-operative assessment

## 2018-05-09 NOTE — Progress Notes (Signed)
Pt ambulated to bathroom using rolling walker. Pt steady on her feet and tolerated well. Pt returned to bed with call light within reach.

## 2018-05-09 NOTE — Progress Notes (Signed)
Samantha PA for vascular surgery at bedside to evaluate swelling around incision site. No new orders given, but RN will have PA or MD to evaluate prior to transfer to floor.

## 2018-05-10 DIAGNOSIS — Z23 Encounter for immunization: Secondary | ICD-10-CM | POA: Diagnosis not present

## 2018-05-10 LAB — CBC
HCT: 40.6 % (ref 36.0–46.0)
Hemoglobin: 13.6 g/dL (ref 12.0–15.0)
MCH: 30.9 pg (ref 26.0–34.0)
MCHC: 33.5 g/dL (ref 30.0–36.0)
MCV: 92.3 fL (ref 80.0–100.0)
Platelets: 167 10*3/uL (ref 150–400)
RBC: 4.4 MIL/uL (ref 3.87–5.11)
RDW: 13.5 % (ref 11.5–15.5)
WBC: 11.9 10*3/uL — ABNORMAL HIGH (ref 4.0–10.5)
nRBC: 0 % (ref 0.0–0.2)

## 2018-05-10 LAB — BASIC METABOLIC PANEL
Anion gap: 18 — ABNORMAL HIGH (ref 5–15)
BUN: 14 mg/dL (ref 8–23)
CALCIUM: 8.2 mg/dL — AB (ref 8.9–10.3)
CO2: 24 mmol/L (ref 22–32)
Chloride: 86 mmol/L — ABNORMAL LOW (ref 98–111)
Creatinine, Ser: 1.42 mg/dL — ABNORMAL HIGH (ref 0.44–1.00)
GFR calc Af Amer: 43 mL/min — ABNORMAL LOW (ref >60)
GFR, EST NON AFRICAN AMERICAN: 37 mL/min — AB (ref >60)
Glucose, Bld: 442 mg/dL — ABNORMAL HIGH (ref 70–99)
Potassium: 3.8 mmol/L (ref 3.5–5.1)
Sodium: 128 mmol/L — ABNORMAL LOW (ref 135–145)

## 2018-05-10 MED ORDER — SODIUM CHLORIDE 0.9 % IV SOLN
INTRAVENOUS | Status: DC
  Administered 2018-05-10: 09:00:00 via INTRAVENOUS

## 2018-05-10 NOTE — Discharge Summary (Signed)
Discharge Summary     Ashley Doyle 02/19/1946 72 y.o. female  [id number]  Admission Date: 05/09/2018  Discharge Date: 05/10/18  Physician: Serafina Mitchell, MD  Admission Diagnosis: RIGHT CAROTID STENOSIS   HPI:   This is a 72 y.o. female who returns today for follow-up. 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  She remains asymptomatic.  Specifically, she denies numbness or weakness in either extremity.  She notes her speech.  She denies amaurosis fugax.  She is eager to get the right side corrected.  The patient has been diagnosed with diabetes in the past however she is not on medication and her A1c is 5.6 and she no longer carries that diagnosis. She has a history of COPD secondary to significant tobacco abuse. She is medically managed for hypertension which is under good control. She takes a statin for hypercholesterolemia.  Hospital Course:  The patient was admitted to the hospital and taken to the operating room on 05/09/2018 and underwent right carotid endarterectomy.    Findings: 90 %stenosis; Thrombus:  none  The pt tolerated the procedure well and was transported to the PACU in good condition.  In the recovery room, the pt was having swelling and pain around her incision.  Her oxygen saturations were good on 3LO2NC.  She was not having any difficulty breathing or swallowing.     By POD 1, the pt neuro status is in tact.  Her tongue is midline.  The swelling in her neck is less and is soft.  Her pain is much better.  She is not having any trouble swallowing.  She has ambulated to the bathroom.  She did have an increase in her creatinine.  Her fluids were increased.  Per Dr. Donnetta Hutching, okay to continue Maxide.   The remainder of the hospital course consisted of increasing mobilization and increasing intake of solids without difficulty.   Recent Labs    05/10/18 0246    NA 128*  K 3.8  CL 86*  CO2 24  GLUCOSE 442*  BUN 14  CALCIUM 8.2*   Recent Labs    05/10/18 0246  WBC 11.9*  HGB 13.6  HCT 40.6  PLT 167   No results for input(s): INR in the last 72 hours.     Discharge Diagnosis:  RIGHT CAROTID STENOSIS  Secondary Diagnosis: Patient Active Problem List   Diagnosis Date Noted  . Asymptomatic stenosis of right carotid artery without infarction 05/09/2018  . Carotid stenosis 01/03/2018  . Carotid body tumor ([place]) 01/02/2018  . Breast cancer, right ([place]) 11/22/2016  . Tobacco abuse 03/11/2016  . Invasive ductal carcinoma of breast, female, right ([place]) 03/07/2016   Past Medical History:  Diagnosis Date  . Anxiety   . Arthritis   . Asthma    not on inhalers now- no flares recently  . Breast cancer ([place])   . Chronic kidney disease    "as a child" "severe infection and it was toxic"  . Constipation   . COPD (chronic obstructive pulmonary disease) ([place])   . Depression   . Dyspnea    with exertion  . Emphysema of lung ([place])   . Family history of adverse reaction to anesthesia    sister has PONV  . GERD (gastroesophageal reflux disease)   . Headache    history of migraines- none since hysterectomy- has bad headaches at times  . History of chemotherapy   .  HOH (hard of hearing)   . Hypertension   . Invasive ductal carcinoma of breast, female, right ([place]) 03/07/2016  . Peripheral vascular disease ([place])   . Pinched nerve in neck    and back  . Sciatica   . Stroke [facility])    2 - right sided weakness- right leg - "speech expressive aphasia when nervous"  . Tuberculosis    "several years ago" 04/11/16  . Wears glasses   . Wears hearing aid    left ear    Allergies as of 05/10/2018   No Known Allergies     Medication List    TAKE these medications   aspirin EC 81 MG tablet Take 1 tablet (81 mg total) by mouth daily.   atorvastatin 40 MG tablet Commonly known as:  LIPITOR Take 1 tablet (40 mg total) by mouth daily.    citalopram 40 MG tablet Commonly known as:  CELEXA Take 40 mg by mouth daily.   docusate sodium 100 MG capsule Commonly known as:  COLACE Take 100 mg by mouth daily as needed for mild constipation.   DRY EYES OP Place 1 drop into both eyes daily as needed (for dry eyes).   gabapentin 300 MG capsule Commonly known as:  NEURONTIN Take 1 capsule (300 mg total) by mouth 3 (three) times daily.   LORazepam 0.5 MG tablet Commonly known as:  ATIVAN Take 1 tablet (0.5 mg total) by mouth every 8 (eight) hours. What changed:    when to take this  reasons to take this   methocarbamol 750 MG tablet Commonly known as:  ROBAXIN Take 1 tablet (750 mg total) by mouth every 8 (eight) hours as needed (use for muscle cramps/pain).   metoprolol tartrate 25 MG tablet Commonly known as:  LOPRESSOR Take 1 tablet (25 mg total) by mouth 2 (two) times daily.   omeprazole 20 MG capsule Commonly known as:  PRILOSEC TAKE 1 CAPSULE BY MOUTH EVERY DAY What changed:  how much to take   oxyCODONE 5 MG immediate release tablet Commonly known as:  Oxy IR/ROXICODONE Take 1 tablet (5 mg total) by mouth every 6 (six) hours as needed for moderate pain, severe pain or breakthrough pain.   oxyCODONE-acetaminophen 10-325 MG tablet Commonly known as:  PERCOCET Take 1 tablet by mouth every 8 (eight) hours as needed for pain.   triamterene-hydrochlorothiazide 37.5-25 MG tablet Commonly known as:  MAXZIDE-25 Take 1 tablet by mouth daily.        [facility] Discharge Instructions Carotid Endarterectomy (CEA)  Please refer to the following instructions for your post-procedure care. Your surgeon or physician assistant will discuss any changes with you.  Activity  You are encouraged to walk as much as you can. You can slowly return to normal activities but must avoid strenuous activity and heavy lifting until your doctor tell you it's OK. Avoid activities such as vacuuming  or swinging a golf club. You can drive after one week if you are comfortable and you are no longer taking prescription pain medications. It is normal to feel tired for serval weeks after your surgery. It is also normal to have difficulty with sleep habits, eating, and bowel movements after surgery. These will go away with time.  Bathing/Showering  You may shower after you come home. Do not soak in a bathtub, hot tub, or swim until the incision heals completely.  Incision Care  Shower every day. Clean your incision with mild soap and water. [place]  the area dry with a clean towel. You do not need a bandage unless otherwise instructed. Do not apply any ointments or creams to your incision. You may have skin glue on your incision. Do not peel it off. It will come off on its own in about one week. Your incision may feel thickened and raised for several weeks after your surgery. This is normal and the skin will soften over time. For Men Only: It's OK to shave around the incision but do not shave the incision itself for 2 weeks. It is common to have numbness under your chin that could last for several months.  Diet  Resume your normal diet. There are no special food restrictions following this procedure. A low fat/low cholesterol diet is recommended for all patients with vascular disease. In order to heal from your surgery, it is CRITICAL to get adequate nutrition. Your body requires vitamins, minerals, and protein. Vegetables are the best source of vitamins and minerals. Vegetables also provide the perfect balance of protein. Processed food has little nutritional value, so try to avoid this.  Medications  Resume taking all of your medications unless your doctor or physician assistant tells you not to.  If your incision is causing pain, you may take over-the- counter pain relievers such as acetaminophen (Tylenol). If you were prescribed a stronger pain medication, please be aware these medications can cause  nausea and constipation.  Prevent nausea by taking the medication with a snack or meal. Avoid constipation by drinking plenty of fluids and eating foods with a high amount of fiber, such as fruits, vegetables, and grains.  Do not take Tylenol if you are taking prescription pain medications.  Follow Up  Our office will schedule a follow up appointment 2-3 weeks following discharge.  Please call us immediately for any of the following conditions  . Increased pain, redness, drainage (pus) from your incision site. . Fever of 101 degrees or higher. . If you should develop stroke (slurred speech, difficulty swallowing, weakness on one side of your body, loss of vision) you should call 911 and go to the nearest emergency room. .  Reduce your risk of vascular disease:  . Stop smoking. If you would like help call [facility] at 1-800-QUIT-NOW 205-320-6804) or [place] at (202)728-0298. . Manage your cholesterol . Maintain a desired weight . Control your diabetes . Keep your blood pressure down .  If you have any questions, please call the office at [phone].  Prescriptions given: None-pt on narcotics PTA  Disposition: home  Patient's condition: is Good  Follow up: 1. Dr. Trula Slade in 2 weeks. 2. Dr. [place] Crane in 1 week to check BMP   Leontine Locket, PA-C [facility] [phone]   --- For [facility] use ---   Modified Rankin score at D/C (0-6): 0  IV medication needed for:  1. Hypertension: No 2. Hypotension: No  Post-op Complications: No  1. Post-op CVA or TIA: No  If yes: Event classification (right eye, left eye, right cortical, left cortical, verterobasilar, other): n/a  If yes: Timing of event (intra-op, <6 hrs post-op, >=6 hrs post-op, unknown): n/a  2. CN injury: No  If yes: CN n/a injuried   3. Myocardial infarction: No  If yes: Dx by (EKG or clinical, Troponin): n/a  4.  CHF: No  5.  Dysrhythmia (new): No  6. Wound infection:  No  7. Reperfusion symptoms: No  8. Return to OR: No  If yes: return to OR for (bleeding,  neurologic, other CEA incision, other): n/a  Discharge medications: Statin use:  Yes ASA use:  Yes   Beta blocker use:  Yes ACE-Inhibitor use:  No  ARB use:  No CCB use: No P2Y12 Antagonist use: No, [ ]  Plavix, [ ]  Plasugrel, [ ]  Ticlopinine, [ ]  Ticagrelor, [ ]  Other, [ ]  No for medical reason, [ ]  Non-compliant, [ ]  Not-indicated Anti-coagulant use:  No, [ ]  Warfarin, [ ]  Rivaroxaban, [ ]  Dabigatran,

## 2018-05-10 NOTE — Plan of Care (Signed)

## 2018-05-10 NOTE — Progress Notes (Addendum)
  Progress Note    05/10/2018 7:36 AM 1 Day Post-Op  Subjective:  Feels better today; denies trouble swallowing  Afebrile HR 70's-80's 562'B-638'L systolic 37% [id number]  Vitals:   05/10/18 0400 05/10/18 0440  BP: (!) 173/86 (!) 155/82  Pulse: 74 84  Resp: (!) 21 (!) 24  Temp:  97.6 F (36.4 C)  SpO2: 96% 96%     Physical Exam: Neuro:  In tact; tongue is midline Lungs:  Non labored; on 2LO2NC Incision:  Clean and dry; less fullness this am and is soft.  CBC    Component Value Date/Time   WBC 11.9 (H) 05/10/2018 0246   RBC 4.40 05/10/2018 0246   HGB 13.6 05/10/2018 0246   HCT 40.6 05/10/2018 0246   PLT 167 05/10/2018 0246   MCV 92.3 05/10/2018 0246   MCH 30.9 05/10/2018 0246   MCHC 33.5 05/10/2018 0246   RDW 13.5 05/10/2018 0246   LYMPHSABS 1.1 01/03/2018 0338   MONOABS 0.5 01/03/2018 0338   EOSABS 0.0 01/03/2018 0338   BASOSABS 0.0 01/03/2018 0338    BMET    Component Value Date/Time   NA 128 (L) 05/10/2018 0246   K 3.8 05/10/2018 0246   CL 86 (L) 05/10/2018 0246   CO2 24 05/10/2018 0246   GLUCOSE 442 (H) 05/10/2018 0246   BUN 14 05/10/2018 0246   CREATININE 1.42 (H) 05/10/2018 0246   CALCIUM 8.2 (L) 05/10/2018 0246   GFRNONAA 37 (L) 05/10/2018 0246   GFRAA 43 (L) 05/10/2018 0246     Intake/Output Summary (Last 24 hours) at 05/10/2018 0736 Last data filed at 05/09/2018 2333 Gross per 24 hour  Intake 1709.83 ml  Output 175 ml  Net 1534.83 ml     Assessment/Plan:  This is a 72 y.o. female who is s/p right CEA 1 Day Post-Op  -pt is doing well this am.  Her incision is clean and there is less fullness and is soft this am -pt neuro exam is in tact; no difficulty swallowing -creatinine is elevated this am-will increase IVF to 100cc/hr -pt has ambulated to the restroom but not in the hallways -pt has voided -f/u with Dr. Trula Slade in 2 weeks.   Leontine Locket, PA-C [facility] [phone]   I have examined the patient,  reviewed and agree with above.  Curt Jews, MD 05/10/2018 10:26 AM

## 2018-05-10 NOTE — Progress Notes (Signed)
Patient lost IV access. Placed consult with IV team because she was a hard stick. Will continue antibiotics once new IV is placed. Site is clean and dry. Will continue to monitor. Lajoyce Corners, RN

## 2018-05-10 NOTE — Discharge Instructions (Signed)
[facility]  Discharge Instructions   Carotid Endarterectomy (CEA)  Please refer to the following instructions for your post-procedure care. Your surgeon or physician assistant will discuss any changes with you.  Activity  You are encouraged to walk as much as you can. You can slowly return to normal activities but must avoid strenuous activity and heavy lifting until your doctor tell you it's okay. Avoid activities such as vacuuming or swinging a golf club. You can drive after one week if you are comfortable and you are no longer taking prescription pain medications. It is normal to feel tired for serval weeks after your surgery. It is also normal to have difficulty with sleep habits, eating, and bowel movements after surgery. These will go away with time.  Bathing/Showering  Shower daily after you go home. Do not soak in a bathtub, hot tub, or swim until the incision heals completely.  Incision Care  Shower every day. Clean your incision with mild soap and water. Pat the area dry with a clean towel. You do not need a bandage unless otherwise instructed. Do not apply any ointments or creams to your incision. You may have skin glue on your incision. Do not peel it off. It will come off on its own in about one week. Your incision may feel thickened and raised for several weeks after your surgery. This is normal and the skin will soften over time.   For Men Only: It's okay to shave around the incision but do not shave the incision itself for 2 weeks. It is common to have numbness under your chin that could last for several months.  Diet  Resume your normal diet. There are no special food restrictions following this procedure. A low fat/low cholesterol diet is recommended for all patients with vascular disease. In order to heal from your surgery, it is CRITICAL to get adequate nutrition. Your body requires vitamins, minerals, and protein. Vegetables are the  best source of vitamins and minerals. Vegetables also provide the perfect balance of protein. Processed food has little nutritional value, so try to avoid this.  Medications  Resume taking all of your medications unless your doctor or physician assistant tells you not to. If your incision is causing pain, you may take over-the- counter pain relievers such as acetaminophen (Tylenol). If you were prescribed a stronger pain medication, please be aware these medications can cause nausea and constipation. Prevent nausea by taking the medication with a snack or meal. Avoid constipation by drinking plenty of fluids and eating foods with a high amount of fiber, such as fruits, vegetables, and grains.  Do not take Tylenol if you are taking prescription pain medications.  Follow Up  Our office will schedule a follow up appointment 2-3 weeks following discharge.  Please call Dr. Juel Burrow office on Monday to make an appointment to have your blood work (BMP for kidney function) drawn one day next week.  Please call us immediately for any of the following conditions   Increased pain, redness, drainage (pus) from your incision site.  Fever of 101 degrees or higher.  If you should develop stroke (slurred speech, difficulty swallowing, weakness on one side of your body, loss of vision) you should call 911 and go to the nearest emergency room.   Reduce your risk of vascular disease:   Stop smoking. If you would like help call [facility] at 1-800-QUIT-NOW 478-801-1993) or [place] at (636) 664-4399.  Manage your cholesterol  Maintain a desired  weight  Control your diabetes  Keep your blood pressure down   If you have any questions, please call the office at [phone].

## 2018-05-10 NOTE — Progress Notes (Signed)
Patient is discharged per MD orders. SWAT RN is discharging the patient.

## 2018-05-12 ENCOUNTER — Encounter (HOSPITAL_COMMUNITY): Payer: Self-pay | Admitting: Surgery

## 2018-05-12 ENCOUNTER — Telehealth: Payer: Self-pay | Admitting: Surgery

## 2018-05-12 LAB — POCT ACTIVATED CLOTTING TIME: Activated Clotting Time: 274 seconds

## 2018-05-12 NOTE — Telephone Encounter (Signed)
-----   Message from Gabriel Earing, [place] sent at 05/10/2018  7:40 AM EST ----- S/p right CEA 05/09/18.  F/u with Dr. Trula Slade in 2-3 weeks.  Thanks

## 2018-05-12 NOTE — Telephone Encounter (Signed)
sch appt lvm mld ltr 06/02/2018 1115am p/o MD

## 2018-05-20 DIAGNOSIS — Z6830 Body mass index (BMI) 30.0-30.9, adult: Secondary | ICD-10-CM | POA: Diagnosis not present

## 2018-05-20 DIAGNOSIS — E1122 Type 2 diabetes mellitus with diabetic chronic kidney disease: Secondary | ICD-10-CM | POA: Diagnosis not present

## 2018-05-20 DIAGNOSIS — R7301 Impaired fasting glucose: Secondary | ICD-10-CM | POA: Diagnosis not present

## 2018-05-20 DIAGNOSIS — E782 Mixed hyperlipidemia: Secondary | ICD-10-CM | POA: Diagnosis not present

## 2018-05-20 DIAGNOSIS — R944 Abnormal results of kidney function studies: Secondary | ICD-10-CM | POA: Diagnosis not present

## 2018-05-20 DIAGNOSIS — N182 Chronic kidney disease, stage 2 (mild): Secondary | ICD-10-CM | POA: Diagnosis not present

## 2018-05-20 DIAGNOSIS — I10 Essential (primary) hypertension: Secondary | ICD-10-CM | POA: Diagnosis not present

## 2018-06-02 ENCOUNTER — Ambulatory Visit (INDEPENDENT_AMBULATORY_CARE_PROVIDER_SITE_OTHER): Payer: Self-pay | Admitting: Surgery

## 2018-06-02 ENCOUNTER — Encounter: Payer: Self-pay | Admitting: Surgery

## 2018-06-02 ENCOUNTER — Other Ambulatory Visit: Payer: Self-pay

## 2018-06-02 VITALS — BP 160/85 | HR 69 | Temp 97.4°F | Resp 20 | Ht 62.0 in | Wt 170.0 lb

## 2018-06-02 DIAGNOSIS — I6523 Occlusion and stenosis of bilateral carotid arteries: Secondary | ICD-10-CM

## 2018-06-02 NOTE — Progress Notes (Signed)
Patient name: Ashley Doyle MRN: [id number] DOB: [birth year] Sex: female  REASON FOR VISIT:    Postop  HISTORY OF PRESENT ILLNESS:   Ashley Doyle is a 73 y.o. female who has been followed for carotid stenosis.  She is found to have bilateral stenosis.  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  On 01/03/2018 she underwent left carotid endarterectomy with patch angioplasty as well as resection of the left carotid body tumor.  She tolerated this procedure well.  Once she recovered from this operation, on 05/09/2018 she underwent right carotid endarterectomy.  Intraoperative findings included a 90% stenosis which went well into the internal carotid artery for approximately 3 cm.  Her hypoglossal nerve had to be fully mobilized to get to the distal end of the plaque.  The patient has been diagnosed with diabetes in the past however she is not on medication and her A1c is 5.6 and she no longer carries that diagnosis. She has a history of COPD secondary to significant tobacco abuse. She is medically managed for hypertension which is under good control. She takes a statin for hypercholesterolemia.   She has no complaints today.  She has no swallowing difficulty.  She has no neurologic deficits. CURRENT MEDICATIONS:    Current Outpatient Medications  Medication Sig Dispense Refill  . Artificial Tear Ointment (DRY EYES OP) Place 1 drop into both eyes daily as needed (for dry eyes).    Marland Kitchen aspirin EC 81 MG tablet Take 1 tablet (81 mg total) by mouth daily. 90 tablet 3  . citalopram (CELEXA) 40 MG tablet Take 40 mg by mouth daily.  4  . docusate sodium (COLACE) 100 MG capsule Take 100 mg by mouth daily as needed for mild constipation.    . gabapentin (NEURONTIN) 300 MG capsule Take 1 capsule (300 mg total) by mouth 3 (three) times daily. 90 capsule 2  . LORazepam (ATIVAN) 0.5 MG tablet Take 1 tablet (0.5 mg total) by mouth every 8 (eight) hours.  (Patient taking differently: Take 0.5 mg by mouth every 8 (eight) hours as needed for anxiety. ) 30 tablet 0  . methocarbamol (ROBAXIN) 750 MG tablet Take 1 tablet (750 mg total) by mouth every 8 (eight) hours as needed (use for muscle cramps/pain). 20 tablet 0  . omeprazole (PRILOSEC) 20 MG capsule TAKE 1 CAPSULE BY MOUTH EVERY DAY (Patient taking differently: Take 20 mg by mouth daily. ) 30 capsule 0  . oxyCODONE (OXY IR/ROXICODONE) 5 MG immediate release tablet Take 1 tablet (5 mg total) by mouth every 6 (six) hours as needed for moderate pain, severe pain or breakthrough pain. 6 tablet 0  . oxyCODONE-acetaminophen (PERCOCET) 10-325 MG tablet Take 1 tablet by mouth every 8 (eight) hours as needed for pain.    Marland Kitchen triamterene-hydrochlorothiazide (MAXZIDE-25) 37.5-25 MG tablet Take 1 tablet by mouth daily.  1  . atorvastatin (LIPITOR) 40 MG tablet Take 1 tablet (40 mg total) by mouth daily. 90 tablet 3  . metoprolol tartrate (LOPRESSOR) 25 MG tablet Take 1 tablet (25 mg total) by mouth 2 (two) times daily. 180 tablet 3   No current facility-administered medications for this visit.     REVIEW OF SYSTEMS:   [X]  denotes positive finding, [ ]  denotes negative finding Cardiac  Comments:  Chest pain or chest pressure:    Shortness of breath upon exertion:    Short of breath when lying flat:    Irregular heart rhythm:    Constitutional  Fever or chills:      PHYSICAL EXAM:   Vitals:   06/02/18 1124 06/02/18 1126  BP: (!) 147/81 (!) 160/85  Pulse: 69   Resp: 20   Temp: (!) 97.4 F (36.3 C)   TempSrc: Oral   SpO2: 94%   Weight: 170 lb (77.1 kg)   Height: 5\' 2"  (1.575 m)     GENERAL: The patient is a well-nourished female, in no acute distress. The vital signs are documented above. CARDIOVASCULAR: There is a regular rate and rhythm. PULMONARY: Non-labored respirations Incision well-healed.  Neurologically intact.  Tongue midline.  Smile symmetric.  STUDIES:   None   MEDICAL  ISSUES:   Status post staged bilateral carotid endarterectomy and resection of left carotid body tumor.  She is doing very well at this time.  She will follow-up in 6 months with surveillance ultrasound.  Annamarie Major, MD [facility] [phone] Pager [phone]

## 2018-06-11 DIAGNOSIS — H906 Mixed conductive and sensorineural hearing loss, bilateral: Secondary | ICD-10-CM | POA: Diagnosis not present

## 2018-06-17 DIAGNOSIS — E1122 Type 2 diabetes mellitus with diabetic chronic kidney disease: Secondary | ICD-10-CM | POA: Diagnosis not present

## 2018-06-17 DIAGNOSIS — N3281 Overactive bladder: Secondary | ICD-10-CM | POA: Diagnosis not present

## 2018-06-17 DIAGNOSIS — C50919 Malignant neoplasm of unspecified site of unspecified female breast: Secondary | ICD-10-CM | POA: Diagnosis not present

## 2018-06-17 DIAGNOSIS — N182 Chronic kidney disease, stage 2 (mild): Secondary | ICD-10-CM | POA: Diagnosis not present

## 2018-06-17 DIAGNOSIS — I2510 Atherosclerotic heart disease of native coronary artery without angina pectoris: Secondary | ICD-10-CM | POA: Diagnosis not present

## 2018-06-17 DIAGNOSIS — F332 Major depressive disorder, recurrent severe without psychotic features: Secondary | ICD-10-CM | POA: Diagnosis not present

## 2018-06-17 DIAGNOSIS — H9190 Unspecified hearing loss, unspecified ear: Secondary | ICD-10-CM | POA: Diagnosis not present

## 2018-06-17 DIAGNOSIS — F17200 Nicotine dependence, unspecified, uncomplicated: Secondary | ICD-10-CM | POA: Diagnosis not present

## 2018-06-17 DIAGNOSIS — I129 Hypertensive chronic kidney disease with stage 1 through stage 4 chronic kidney disease, or unspecified chronic kidney disease: Secondary | ICD-10-CM | POA: Diagnosis not present

## 2018-07-04 DIAGNOSIS — H906 Mixed conductive and sensorineural hearing loss, bilateral: Secondary | ICD-10-CM | POA: Diagnosis not present

## 2018-08-11 DIAGNOSIS — M1712 Unilateral primary osteoarthritis, left knee: Secondary | ICD-10-CM | POA: Diagnosis not present

## 2018-08-11 DIAGNOSIS — M25562 Pain in left knee: Secondary | ICD-10-CM | POA: Diagnosis not present

## 2018-08-18 DIAGNOSIS — M25552 Pain in left hip: Secondary | ICD-10-CM | POA: Diagnosis not present

## 2018-08-25 ENCOUNTER — Other Ambulatory Visit: Payer: Self-pay | Admitting: Internal Medicine

## 2018-08-25 DIAGNOSIS — M25562 Pain in left knee: Secondary | ICD-10-CM

## 2018-08-25 DIAGNOSIS — M25552 Pain in left hip: Secondary | ICD-10-CM

## 2018-08-28 ENCOUNTER — Ambulatory Visit (HOSPITAL_COMMUNITY)
Admission: RE | Admit: 2018-08-28 | Discharge: 2018-08-28 | Disposition: A | Discharge status: Home / Self Care | Payer: Medicare HMO | Source: Ambulatory Visit | Attending: Internal Medicine | Admitting: Internal Medicine

## 2018-08-28 ENCOUNTER — Other Ambulatory Visit: Payer: Self-pay

## 2018-08-28 ENCOUNTER — Other Ambulatory Visit: Payer: Self-pay | Admitting: Internal Medicine

## 2018-08-28 DIAGNOSIS — M25552 Pain in left hip: Secondary | ICD-10-CM | POA: Diagnosis not present

## 2018-08-28 DIAGNOSIS — M25562 Pain in left knee: Secondary | ICD-10-CM | POA: Insufficient documentation

## 2018-09-03 IMAGING — CR DG CHEST 1V PORT
1 series · 1 of 1 positions shown · non-contrast
Comparison: CT 03/09/2016 .

CLINICAL DATA: Port-A-Cath.

EXAM:
PORTABLE CHEST 1 VIEW

[portable]
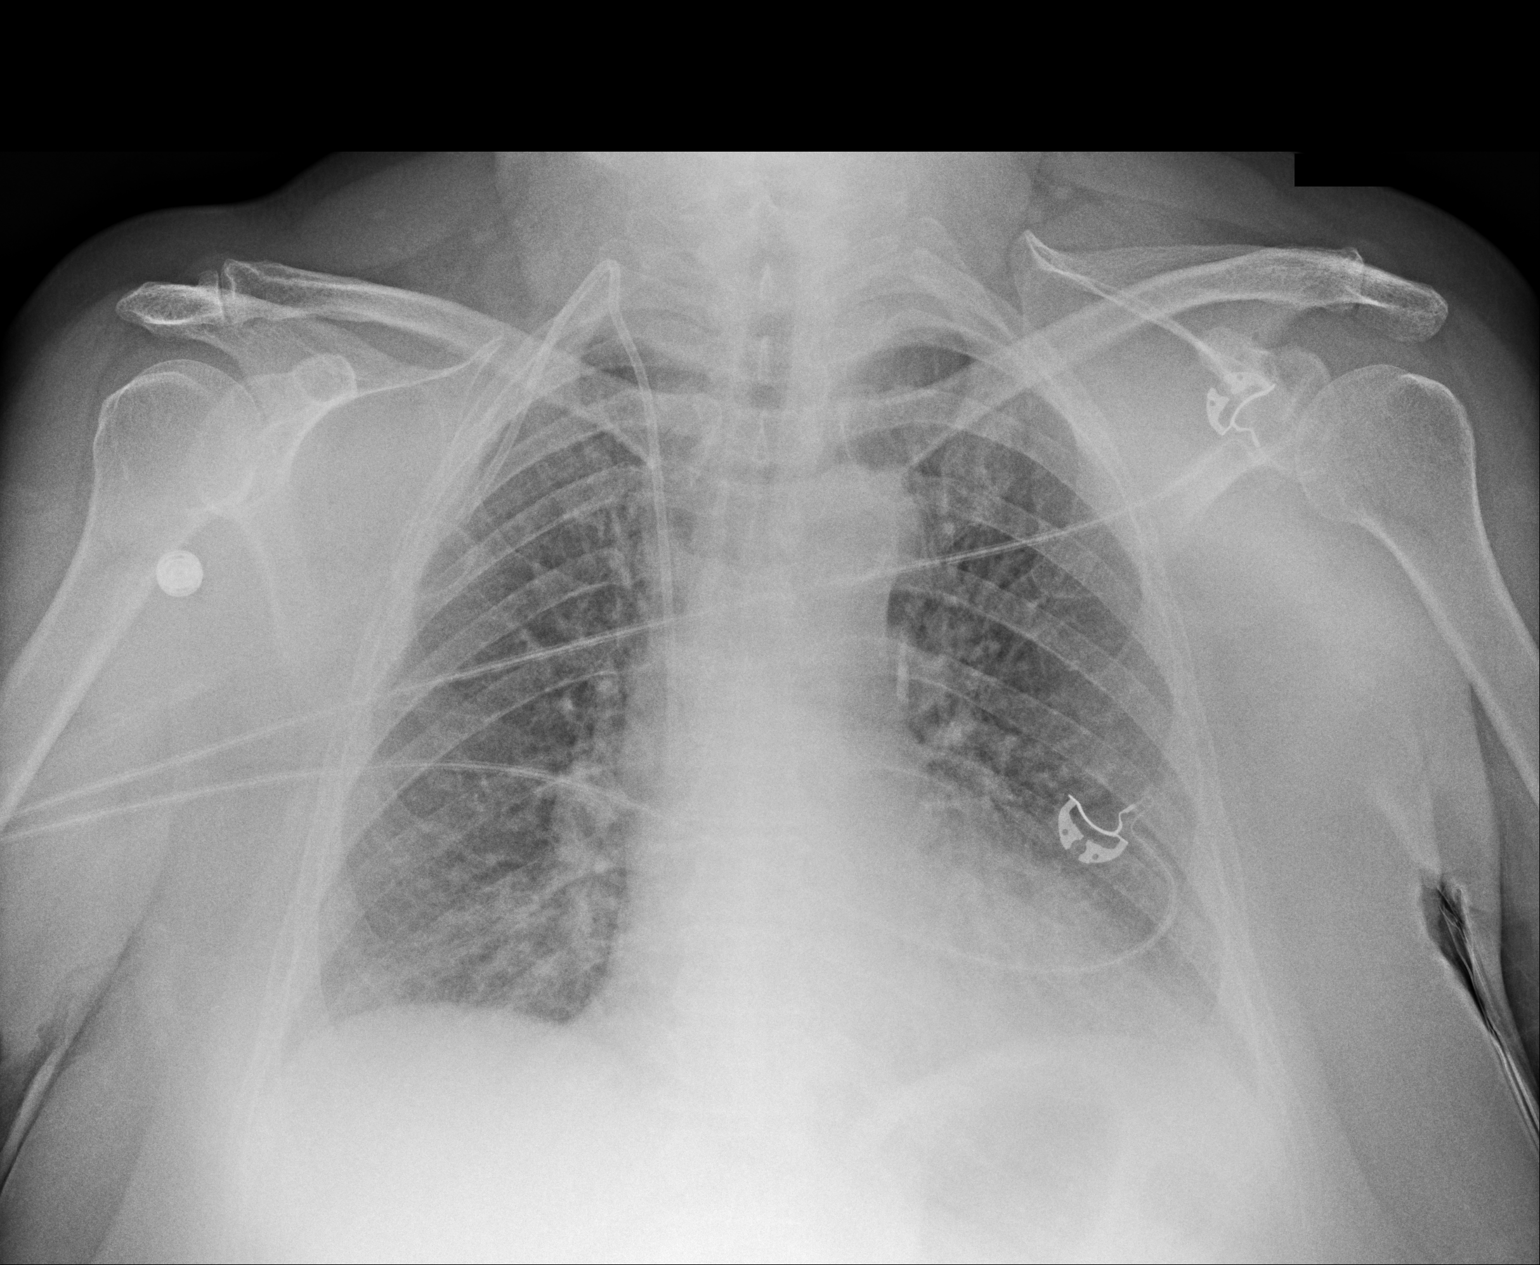

[1 of 1 positions shown; findings below may reference images not displayed]

FINDINGS: Port-A-Cath noted with tip over cavoatrial junction. Cardiomegaly
with mild bilateral interstitial prominence consistent with
congestive heart failure. Tiny bilateral pleural effusions cannot be
excluded. No pneumothorax .
IMPRESSION: 1. Port-A-Cath noted with tip projected over the cavoatrial
junction.

2. Congestive heart failure with pulmonary interstitial edema.

## 2018-09-05 DIAGNOSIS — Z0000 Encounter for general adult medical examination without abnormal findings: Secondary | ICD-10-CM | POA: Diagnosis not present

## 2018-09-09 ENCOUNTER — Encounter (INDEPENDENT_AMBULATORY_CARE_PROVIDER_SITE_OTHER): Payer: Self-pay | Admitting: *Deleted

## 2018-09-24 ENCOUNTER — Other Ambulatory Visit: Payer: Self-pay | Admitting: *Deleted

## 2018-10-17 ENCOUNTER — Other Ambulatory Visit: Payer: Self-pay

## 2018-10-17 ENCOUNTER — Encounter: Payer: Self-pay | Admitting: Orthopedic Surgery

## 2018-10-17 ENCOUNTER — Ambulatory Visit: Payer: Medicare HMO | Admitting: Orthopedic Surgery

## 2018-10-17 ENCOUNTER — Ambulatory Visit (INDEPENDENT_AMBULATORY_CARE_PROVIDER_SITE_OTHER): Payer: Medicare HMO

## 2018-10-17 VITALS — BP 173/90 | HR 83 | Ht 62.0 in | Wt 182.0 lb

## 2018-10-17 DIAGNOSIS — M47816 Spondylosis without myelopathy or radiculopathy, lumbar region: Secondary | ICD-10-CM

## 2018-10-17 DIAGNOSIS — M5416 Radiculopathy, lumbar region: Secondary | ICD-10-CM

## 2018-10-17 NOTE — Progress Notes (Signed)
NEW PROBLEM OFFICE VISIT  Chief Complaint  Patient presents with   Back Pain    down right buttock and leg     73 year old female complains of severe radiating burning lower back pain for years worsening over the last few weeks with acute increase in pain right lower back radiating to the right foot with underlying history of bilateral sensitive feet.  Does not report history of diabetes.     Review of Systems  Constitutional: Positive for diaphoresis.  Respiratory: Positive for cough, shortness of breath and wheezing.   Cardiovascular: Positive for orthopnea and claudication.  Gastrointestinal: Positive for heartburn and nausea.  Musculoskeletal: Positive for back pain, joint pain, myalgias and neck pain.  Skin: Positive for itching.  Neurological: Positive for tingling, sensory change, weakness and headaches.  Endo/Heme/Allergies: Positive for environmental allergies.  Psychiatric/Behavioral: Positive for depression and memory loss. The patient has insomnia.   All other systems reviewed and are negative.    Past Medical History:  Diagnosis Date   Anxiety    Arthritis    Asthma    not on inhalers now- no flares recently   Breast cancer ([place])    Chronic kidney disease    "as a child" "severe infection and it was toxic"   Constipation    COPD (chronic obstructive pulmonary disease) (HCC)    Depression    Dyspnea    with exertion   Emphysema of lung ([place])    Family history of adverse reaction to anesthesia    sister has PONV   GERD (gastroesophageal reflux disease)    Headache    history of migraines- none since hysterectomy- has bad headaches at times   History of chemotherapy    HOH (hard of hearing)    Hypertension    Invasive ductal carcinoma of breast, female, right ([place]) 03/07/2016   Peripheral vascular disease ([place])    Pinched nerve in neck    and back   Sciatica    Stroke ([place])    2 - right sided weakness- right leg - "speech  expressive aphasia when nervous"   Tuberculosis    "several years ago" 04/11/16   Wears glasses    Wears hearing aid    left ear    Past Surgical History:  Procedure Laterality Date   ABDOMINAL HYSTERECTOMY     APPENDECTOMY     Arm surgery Right    injury- cut with glass   BREAST SURGERY     CAROTID BODY TUMOR EXCISION Left 01/03/2018   Procedure: TUMOR EXCISION CAROTID BODY LEFT;  Surgeon: Serafina Mitchell, MD;  Location: [facility] OR;  Service: Vascular;  Laterality: Left;   CAROTID ENDARTERECTOMY Right 05/09/2018   ENDARTERECTOMY Left 01/03/2018   Procedure: ENDARTERECTOMY CAROTID LEFT;  Surgeon: Serafina Mitchell, MD;  Location: [facility] OR;  Service: Vascular;  Laterality: Left;   ENDARTERECTOMY Right 05/09/2018   Procedure: ENDARTERECTOMY CAROTID RIGHT;  Surgeon: Serafina Mitchell, MD;  Location: [facility] OR;  Service: Vascular;  Laterality: Right;   INNER EAR SURGERY Right    IR ANGIO EXTERNAL CAROTID SEL EXT CAROTID UNI L MOD SED  01/02/2018   IR ANGIO INTRA EXTRACRAN SEL COM CAROTID INNOMINATE BILAT MOD SED  01/02/2018   IR ANGIO VERTEBRAL SEL SUBCLAVIAN INNOMINATE UNI R MOD SED  01/02/2018   IR ANGIOGRAM FOLLOW UP STUDY  01/02/2018   IR NEURO EACH ADD'L AFTER BASIC UNI LEFT (MS)  01/02/2018   IR TRANSCATH/EMBOLIZ  01/02/2018   MASTECTOMY WITH RADIOACTIVE SEED GUIDED  EXCISION AND AXILLARY SENTINEL LYMPH NODE BIOPSY Right 11/22/2016   Procedure: REMOVAL OF RIGHT BREAST RADIOACTIVE SEED, RIGHT TOTAL MASTECTOMY WITH RADIOACTIVE SEED TARGETED RIGHT AXILLARY LYMPH NODE EXCISION AND AXILLARY SENTINEL LYMPH NODE BIOPSY, POSSIBLE RIGHT AXILLARY DISSECTION;  Surgeon: Rolm Bookbinder, MD;  Location: [place];  Service: General;  Laterality: Right;   PATCH ANGIOPLASTY Left 01/03/2018   Procedure: LEFT CAROTID ARTERY PATCH ANGIOPLASTY USING XENOSURE BIOLOGIC PATCH;  Surgeon: Serafina Mitchell, MD;  Location: [place];  Service: Vascular;  Laterality: Left;   PORT-A-CATH REMOVAL Left 11/22/2016    Procedure: REMOVAL PORT-A-CATH;  Surgeon: Rolm Bookbinder, MD;  Location: [place];  Service: General;  Laterality: Left;   PORTACATH PLACEMENT N/A 04/12/2016   Procedure: INSERTION PORT-A-CATH WITH [place];  Surgeon: Rolm Bookbinder, MD;  Location: [place];  Service: General;  Laterality: N/A;   RADIOLOGY WITH ANESTHESIA N/A 01/02/2018   Procedure: EMBOLIZATION OF CAROTID BODY TUMOR;  Surgeon: Luanne Bras, MD;  Location: [place];  Service: Radiology;  Laterality: N/A;    Family History  Problem Relation Age of Onset   Stroke Mother    Heart disease Mother    Lung cancer Father    Social History   Tobacco Use   Smoking status: Current Every Day Smoker    Packs/day: 1.50    Years: 55.00    Pack years: 82.50    Types: Cigarettes   Smokeless tobacco: Never Used   Tobacco comment: "30-32 cigarettes per day"  Substance Use Topics   Alcohol use: No   Drug use: No    No Known Allergies  Current Meds  Medication Sig   Artificial Tear Ointment (DRY EYES OP) Place 1 drop into both eyes daily as needed (for dry eyes).   aspirin EC 81 MG tablet Take 1 tablet (81 mg total) by mouth daily.   citalopram (CELEXA) 40 MG tablet Take 40 mg by mouth daily.   docusate sodium (COLACE) 100 MG capsule Take 100 mg by mouth daily as needed for mild constipation.   gabapentin (NEURONTIN) 300 MG capsule Take 1 capsule (300 mg total) by mouth 3 (three) times daily.   LORazepam (ATIVAN) 0.5 MG tablet Take 1 tablet (0.5 mg total) by mouth every 8 (eight) hours. (Patient taking differently: Take 0.5 mg by mouth every 8 (eight) hours as needed for anxiety. )   methocarbamol (ROBAXIN) 750 MG tablet Take 1 tablet (750 mg total) by mouth every 8 (eight) hours as needed (use for muscle cramps/pain).   metoprolol tartrate (LOPRESSOR) 25 MG tablet    omeprazole (PRILOSEC) 20 MG capsule TAKE 1 CAPSULE BY MOUTH EVERY DAY (Patient taking differently: Take 20 mg by mouth daily. )   oxyCODONE (OXY  IR/ROXICODONE) 5 MG immediate release tablet Take 1 tablet (5 mg total) by mouth every 6 (six) hours as needed for moderate pain, severe pain or breakthrough pain.   oxyCODONE-acetaminophen (PERCOCET) 10-325 MG tablet Take 1 tablet by mouth every 8 (eight) hours as needed for pain.   triamterene-hydrochlorothiazide (MAXZIDE-25) 37.5-25 MG tablet Take 1 tablet by mouth daily.    BP (!) 173/90    Pulse 83    Ht 5\' 2"  (1.575 m)    Wt 182 lb (82.6 kg)    BMI 33.29 kg/m   Physical Exam Constitutional:      General: She is awake.     Appearance: Normal appearance. She is well-developed and well-groomed. She is obese. She is not ill-appearing, toxic-appearing or diaphoretic.  Cardiovascular:  Pulses:          Dorsalis pedis pulses are 2+ on the right side and 2+ on the left side.       Posterior tibial pulses are 2+ on the right side and 2+ on the left side.  Musculoskeletal:     Right hip: She exhibits normal range of motion.     Left hip: She exhibits normal range of motion.     Right knee: She exhibits normal range of motion.     Left knee: She exhibits no LCL laxity and no MCL laxity. No tenderness found.       Back:     Right lower leg: No edema.     Left lower leg: No edema.  Neurological:     Mental Status: She is alert.     Sensory: Sensation is intact.     Motor: Motor function is intact. No tremor, atrophy or abnormal muscle tone.     Coordination: Coordination is intact.     Gait: Gait is intact.     Deep Tendon Reflexes:     Reflex Scores:      Tricep reflexes are 1+ on the right side and 1+ on the left side.      Bicep reflexes are 2+ on the right side and 2+ on the left side.      Patellar reflexes are 2+ on the right side and 2+ on the left side.      Achilles reflexes are 0 on the right side and 0 on the left side. Psychiatric:        Attention and Perception: Attention normal.        Mood and Affect: Affect is flat.        Speech: Speech normal.         Behavior: Behavior normal. Behavior is not agitated. Behavior is cooperative.        Thought Content: Thought content normal.        Cognition and Memory: Cognition and memory normal.     Ortho Exam    MEDICAL DECISION SECTION  Xrays were done at [facility]  My independent reading of xrays:  Diagnostic hip pelvis and AP lateral left hip both hips show normal articulation with no arthritic changes joint space narrowing or head deformity femoral offset is normal  DG HIP (WITH OR WITHOUT PELVIS) 2-3V LEFT   COMPARISON:  None.   FINDINGS: No acute fracture. No dislocation. Hip joint spaces are maintained. Minimal degenerative change of the hip joints. Atherosclerotic vascular calcifications are noted.   IMPRESSION: No acute bony pathology.     Electronically Signed   By: Marybelle Killings M.D.   On: 08/28/2018 14:04   She also had a left knee x-ray back in April no fracture dislocation or abnormal alignment no degenerative changes  CLINICAL DATA:  Acute left knee pain   EXAM: LEFT KNEE - 1-2 VIEW   COMPARISON:  None.   FINDINGS: Negative for fracture or mass. Joint spaces are normal. No significant degenerative change or effusion. Atherosclerotic calcification in the popliteal artery.   IMPRESSION: Negative left knee   Atherosclerotic disease     Electronically Signed   By: Franchot Gallo M.D.   On: 08/28/2018 14:04   Internal films were done of the lumbar spine AP lateral and spot film    Encounter Diagnosis  Name Primary?   Lumbar back pain with radiculopathy affecting right lower extremity Yes    PLAN: (Rx., injectx, surgery, frx,  mri/ct) PHYSICAL THERAPY  RETURN TO PMD   No orders of the defined types were placed in this encounter.   Arther Abbott, MD  10/17/2018 9:28 AM

## 2018-10-17 NOTE — Patient Instructions (Addendum)
You have arthritis in your back   You do not need surgery at this time  Continue pain medications prescribed to you by Dr Nevada Crane  Treatment for this I am recommending therapy   See Dr Nevada Crane for any further problems    Lumbosacral Radiculopathy Lumbosacral radiculopathy is a condition that involves the spinal nerves and nerve roots in the low back and bottom of the spine. The condition develops when these nerves and nerve roots move out of place or become inflamed and cause symptoms. What are the causes? This condition may be caused by:  Pressure from a disk that bulges out of place (herniated disk). A disk is a plate of soft cartilage that separates bones in the spine.  Disk changes that occur with age (disk degeneration).  A narrowing of the bones of the lower back (spinal stenosis).  A tumor.  An infection.  An injury that places sudden pressure on the disks that cushion the bones of your lower spine. What increases the risk? You are more likely to develop this condition if:  You are a female who is 20-89 years old.  You are a female who is 47-17 years old.  You use improper technique when lifting things.  You are overweight or live a sedentary lifestyle.  Your work requires frequent lifting.  You smoke.  You do repetitive activities that strain the spine. What are the signs or symptoms? Symptoms of this condition include:  Pain that goes down from your back into your legs (sciatica), usually on one side of the body. This is the most common symptom. The pain may be worse with sitting, coughing, or sneezing.  Pain and numbness in your legs.  Muscle weakness.  Tingling.  Loss of bladder control or bowel control. How is this diagnosed? This condition may be diagnosed based on:  Your symptoms and medical history.  A physical exam. If the pain is lasting, you may have tests, such as:  MRI scan.  X-ray.  CT scan.  A type of X-ray used to examine the spinal  canal after injecting a dye into your spine (myelogram).  A test to measure how electrical impulses move through a nerve (nerve conduction study). How is this treated? Treatment may depend on the cause of the condition and may include:  Working with a physical therapist.  Taking pain medicine.  Applying heat and ice to affected areas.  Doing stretches to improve flexibility.  Doing exercises to strengthen back muscles.  Having chiropractic spinal manipulation.  Using transcutaneous electrical nerve stimulation (TENS) therapy.  Getting a steroid injection in the spine. In some cases, no treatment is needed. If the condition is long-lasting (chronic), or if symptoms are severe, treatment may involve surgery or lifestyle changes, such as following a weight-loss plan. Follow these instructions at home: Activity  Avoid bending and other activities that make the problem worse.  Maintain a proper position when standing or sitting: ? When standing, keep your upper back and neck straight, with your shoulders pulled back. Avoid slouching. ? When sitting, keep your back straight and relax your shoulders. Do not round your shoulders or pull them backward.  Do not sit or stand in one place for long periods of time.  Take brief periods of rest throughout the day. This will reduce your pain. It is usually better to rest by lying down or standing, not sitting.  When you are resting for longer periods, mix in some mild activity or stretching between periods of  rest. This will help to prevent stiffness and pain.  Get regular exercise. Ask your health care provider what activities are safe for you. If you were shown how to do any exercises or stretches, do them as directed by your health care provider.  Do not lift anything that is heavier than 10 lb (4.5 kg) or the limit that you are told by your health care provider. Always use proper lifting technique, which includes: ? Bending your  knees. ? Keeping the load close to your body. ? Avoiding twisting. Managing pain  If directed, put ice on the affected area: ? Put ice in a plastic bag. ? Place a towel between your skin and the bag. ? Leave the ice on for 20 minutes, 2-3 times a day.  If directed, apply heat to the affected area as often as told by your health care provider. Use the heat source that your health care provider recommends, such as a moist heat pack or a heating pad. ? Place a towel between your skin and the heat source. ? Leave the heat on for 20-30 minutes. ? Remove the heat if your skin turns bright red. This is especially important if you are unable to feel pain, heat, or cold. You may have a greater risk of getting burned.  Take over-the-counter and prescription medicines only as told by your health care provider. General instructions  Sleep on a firm mattress in a comfortable position. Try lying on your side with your knees slightly bent. If you lie on your back, put a pillow under your knees.  Do not drive or use heavy machinery while taking prescription pain medicine.  If your health care provider prescribed a diet or exercise program, follow it as directed.  Keep all follow-up visits as told by your health care provider. This is important. Contact a health care provider if:  Your pain does not improve over time, even when taking pain medicines. Get help right away if:  You develop severe pain.  Your pain suddenly gets worse.  You develop increasing weakness in your legs.  You lose the ability to control your bladder or bowel.  You have difficulty walking or balancing.  You have a fever. Summary  Lumbosacral radiculopathy is a condition that occurs when the spinal nerves and nerve roots in the lower part of the spine move out of place or become inflamed and cause symptoms.  Symptoms include pain, numbness, and tingling that go down from your back into your legs (sciatica), muscle  weakness, and loss of bladder control or bowel control.  If directed, apply ice or heat to the affected area as told by your health care provider.  Follow instructions about activity, rest, and proper lifting technique. This information is not intended to replace advice given to you by your health care provider. Make sure you discuss any questions you have with your health care provider. Document Released: 04/30/2005 Document Revised: 04/18/2017 Document Reviewed: 04/18/2017 Elsevier Interactive Patient Education  2019 [employer]  Radicular Pain Radicular pain is a type of pain that spreads from your back or neck along a spinal nerve. Spinal nerves are nerves that leave the spinal cord and go to the muscles. Radicular pain is sometimes called radiculopathy, radiculitis, or a pinched nerve. When you have this type of pain, you may also have weakness, numbness, or tingling in the area of your body that is supplied by the nerve. The pain may feel sharp and burning. Depending on which spinal nerve  is affected, the pain may occur in the:  Neck area (cervical radicular pain). You may also feel pain, numbness, weakness, or tingling in the arms.  Mid-spine area (thoracic radicular pain). You would feel this pain in the back and chest. This type is rare.  Lower back area (lumbar radicular pain). You would feel this pain as low back pain. You may feel pain, numbness, weakness, or tingling in the buttocks or legs. Sciatica is a type of lumbar radicular pain that shoots down the back of the leg. Radicular pain occurs when one of the spinal nerves becomes irritated or squeezed (compressed). It is often caused by something pushing on a spinal nerve, such as one of the bones of the spine (vertebrae) or one of the round cushions between vertebrae (intervertebral disks). This can result from:  An injury.  Wear and tear or aging of a disk.  The growth of a bone spur that pushes on the nerve. Radicular pain  often goes away when you follow instructions from your health care provider for relieving pain at home. Follow these instructions at home: Managing pain      If directed, put ice on the affected area: ? Put ice in a plastic bag. ? Place a towel between your skin and the bag. ? Leave the ice on for 20 minutes, 2-3 times a day.  If directed, apply heat to the affected area as often as told by your health care provider. Use the heat source that your health care provider recommends, such as a moist heat pack or a heating pad. ? Place a towel between your skin and the heat source. ? Leave the heat on for 20-30 minutes. ? Remove the heat if your skin turns bright red. This is especially important if you are unable to feel pain, heat, or cold. You may have a greater risk of getting burned. Activity   Do not sit or rest in bed for long periods of time.  Try to stay as active as possible. Ask your health care provider what type of exercise or activity is best for you.  Avoid activities that make your pain worse, such as bending and lifting.  Do not lift anything that is heavier than 10 lb (4.5 kg), or the limit that you are told, until your health care provider says that it is safe.  Practice using proper technique when lifting items. Proper lifting technique involves bending your knees and rising up.  Do strength and range-of-motion exercises only as told by your health care provider or physical therapist. General instructions  Take over-the-counter and prescription medicines only as told by your health care provider.  Pay attention to any changes in your symptoms.  Keep all follow-up visits as told by your health care provider. This is important. ? Your health care provider may send you to a physical therapist to help with this pain. Contact a health care provider if:  Your pain and other symptoms get worse.  Your pain medicine is not helping.  Your pain has not improved after a  few weeks of home care.  You have a fever. Get help right away if:  You have severe pain, weakness, or numbness.  You have difficulty with bladder or bowel control. Summary  Radicular pain is a type of pain that spreads from your back or neck along a spinal nerve.  When you have radicular pain, you may also have weakness, numbness, or tingling in the area of your body that is  supplied by the nerve.  The pain may feel sharp or burning.  Radicular pain may be treated with ice, heat, medicines, or physical therapy. This information is not intended to replace advice given to you by your health care provider. Make sure you discuss any questions you have with your health care provider. Document Released: 06/07/2004 Document Revised: 11/12/2017 Document Reviewed: 11/12/2017 Elsevier Interactive Patient Education  2019 [employer].

## 2018-10-19 IMAGING — DX DG CHEST 1V
1 series · 1 of 1 positions shown · non-contrast
Comparison: Single view of the chest 04/12/2016. CT chest, abdomen
and pelvis 03/09/2016.

CLINICAL DATA: Midline thoracic spine pain for 3 days. Cough for 10
days.

EXAM:
CHEST 1 VIEW

[chest pa]
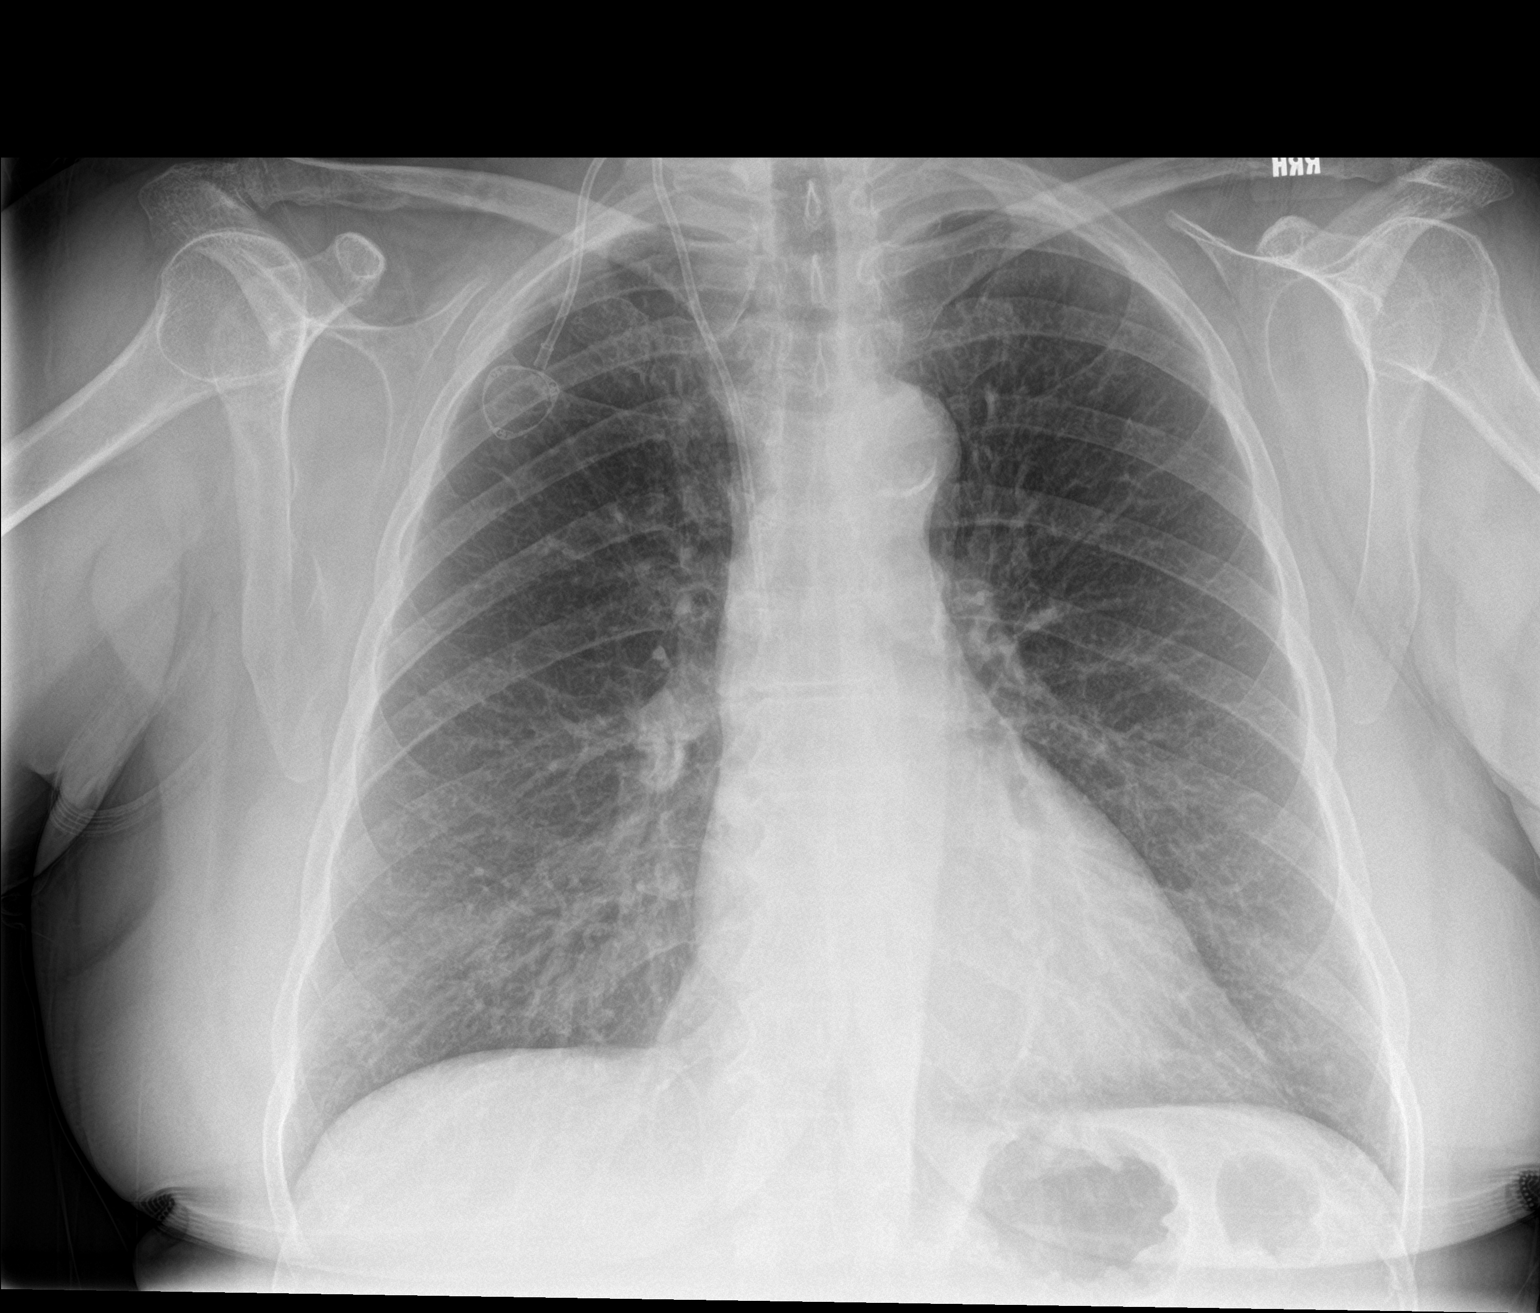

[1 of 1 positions shown; findings below may reference images not displayed]

FINDINGS: Port-A-Cath is in place. Lungs are clear. Heart size is normal. No
pneumothorax or pleural effusion. No bony abnormality.
IMPRESSION: No acute disease.

Atherosclerosis.

## 2018-10-19 IMAGING — DX DG THORACIC SPINE 3V
3 series · 3 of 3 positions shown · non-contrast
Comparison: Chest CT 03/09/2016

CLINICAL DATA: Acute midline thoracic back pain for 3 days. History
breast cancer.

EXAM:
THORACIC SPINE - 3 VIEWS

[t-spine ap]
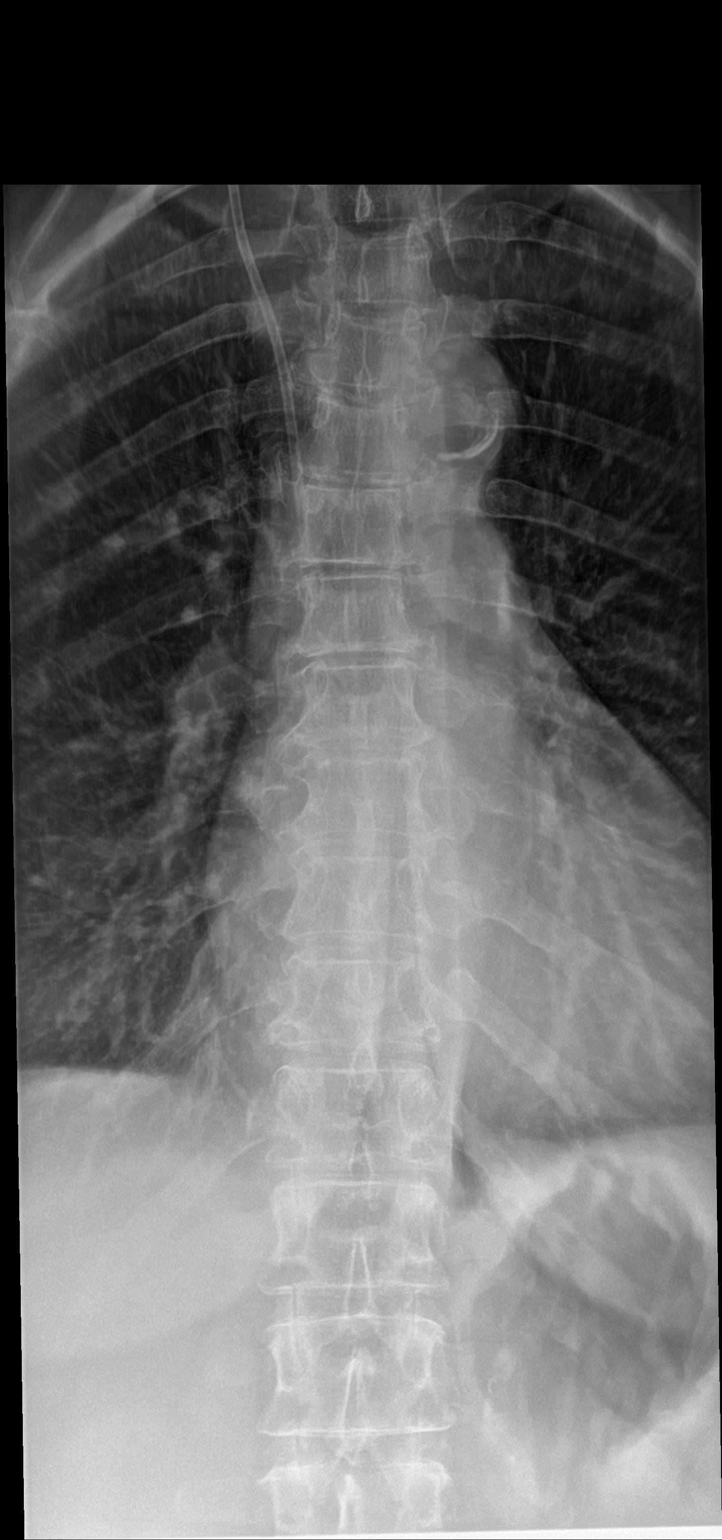

[t-spine lat]
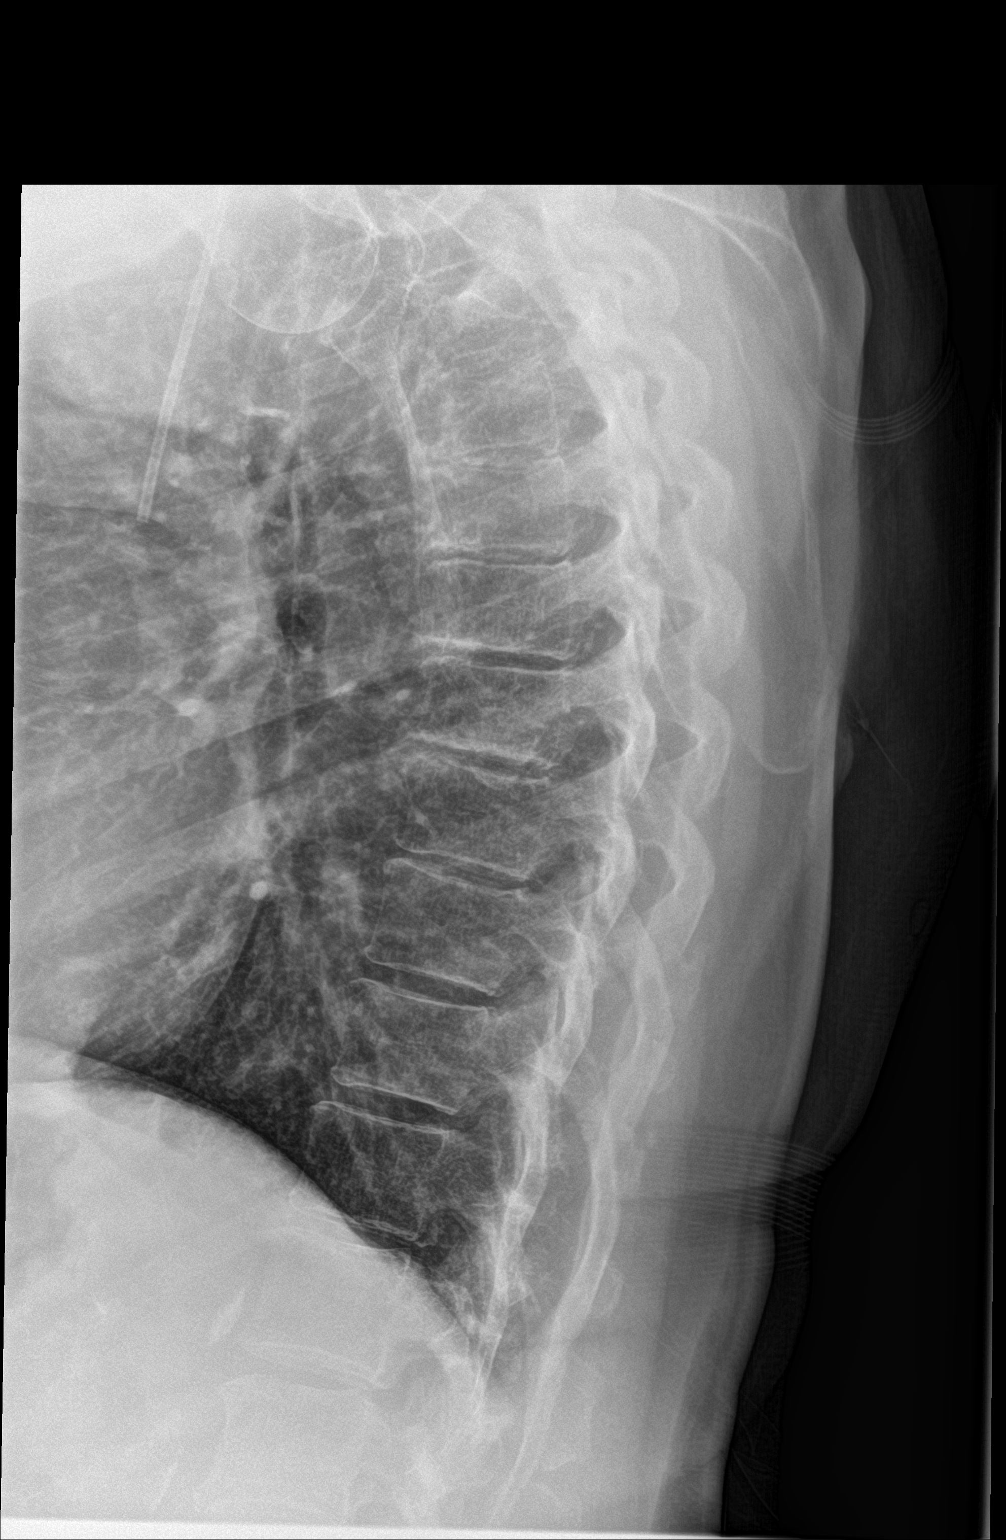

[t-spine swimmers]
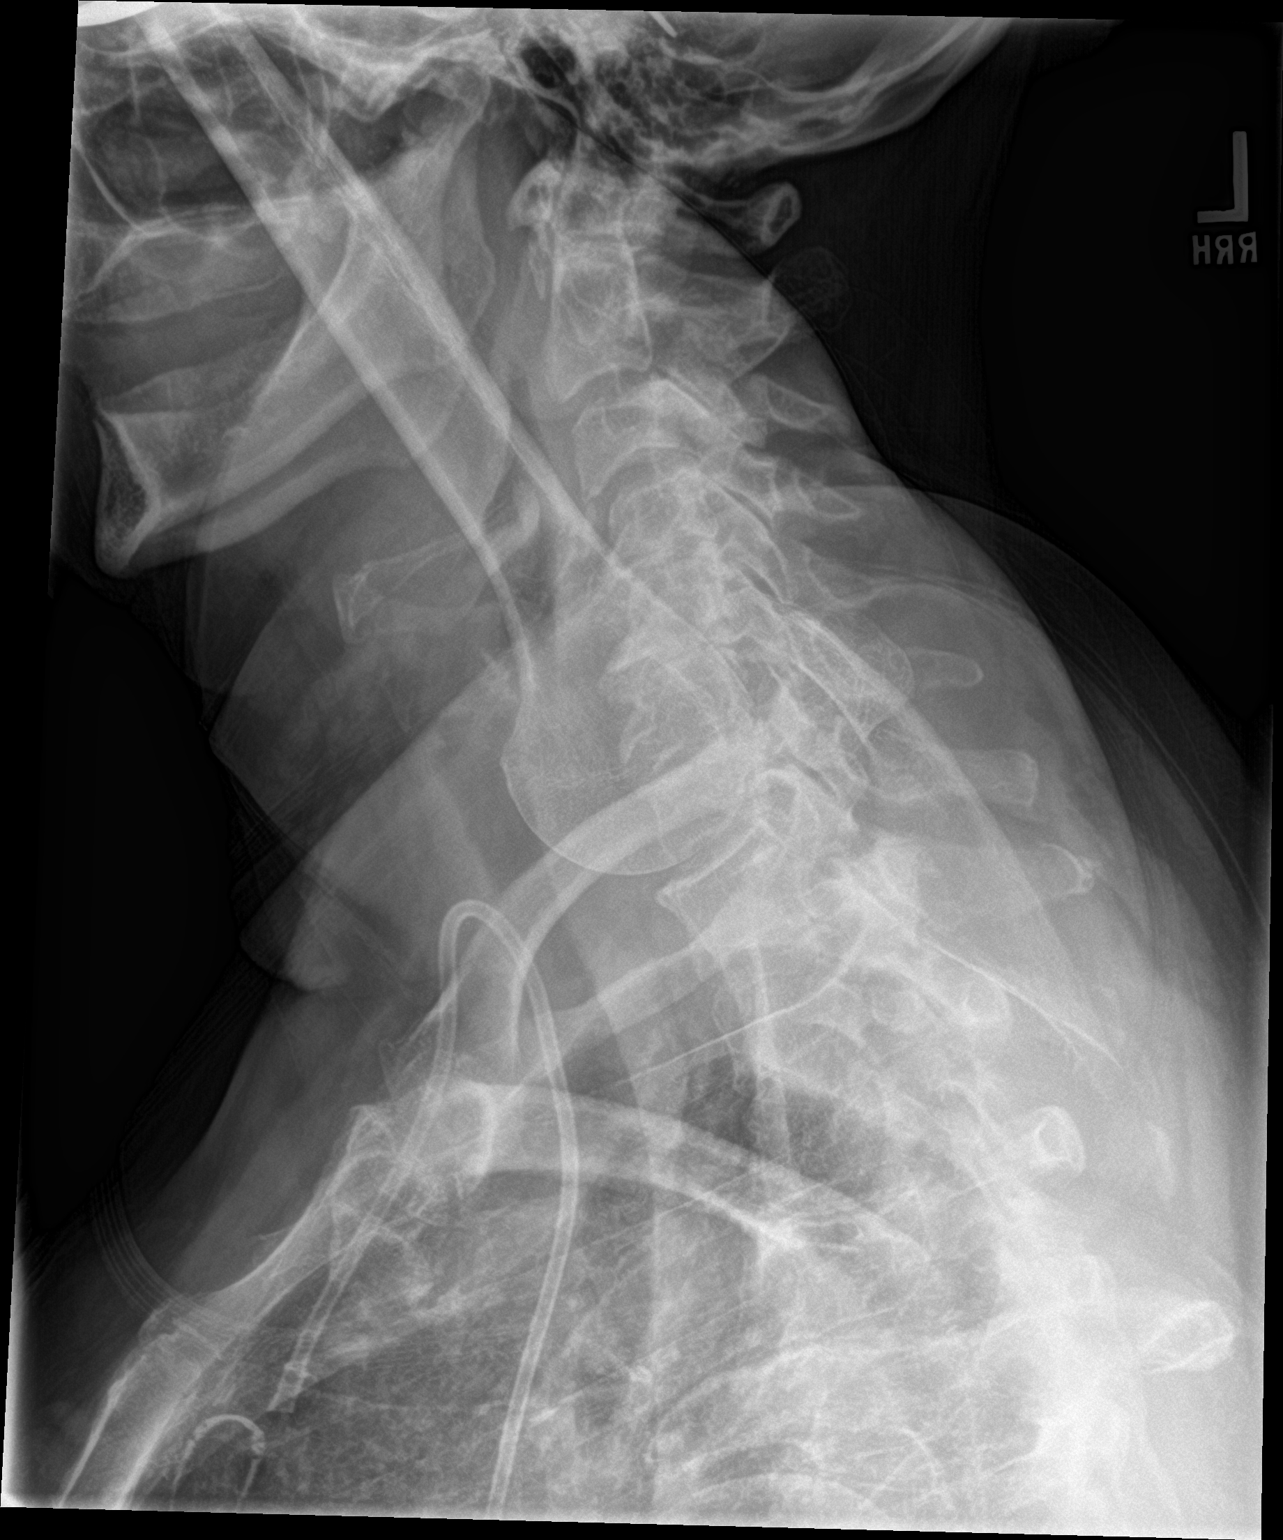

[3 of 3 positions shown; findings below may reference images not displayed]

FINDINGS: No evidence of fracture, discitis, or bone lesion. Mild upper
thoracic levoscoliosis. Thoracic disc narrowing and endplate
ridging.

Multilevel degenerative disc narrowing and endplate ridging and in
the cervical spine.

Cardiomediastinal structures reported separately.
IMPRESSION: No acute finding or evidence of bone lesion.

Spondylosis and minimal scoliosis.

## 2018-11-03 ENCOUNTER — Other Ambulatory Visit: Payer: Self-pay | Admitting: Cardiovascular Disease

## 2018-11-17 DIAGNOSIS — H25813 Combined forms of age-related cataract, bilateral: Secondary | ICD-10-CM | POA: Diagnosis not present

## 2018-11-17 DIAGNOSIS — H34211 Partial retinal artery occlusion, right eye: Secondary | ICD-10-CM | POA: Diagnosis not present

## 2018-11-17 DIAGNOSIS — D3131 Benign neoplasm of right choroid: Secondary | ICD-10-CM | POA: Diagnosis not present

## 2018-11-21 DIAGNOSIS — Z0100 Encounter for examination of eyes and vision without abnormal findings: Secondary | ICD-10-CM | POA: Diagnosis not present

## 2018-11-24 ENCOUNTER — Other Ambulatory Visit: Payer: Self-pay

## 2018-11-24 DIAGNOSIS — I6523 Occlusion and stenosis of bilateral carotid arteries: Secondary | ICD-10-CM

## 2018-12-01 ENCOUNTER — Ambulatory Visit: Payer: Medicare HMO | Admitting: Family

## 2018-12-01 ENCOUNTER — Encounter (HOSPITAL_COMMUNITY): Payer: Medicare HMO

## 2018-12-09 DIAGNOSIS — E1122 Type 2 diabetes mellitus with diabetic chronic kidney disease: Secondary | ICD-10-CM | POA: Diagnosis not present

## 2018-12-09 DIAGNOSIS — R7301 Impaired fasting glucose: Secondary | ICD-10-CM | POA: Diagnosis not present

## 2018-12-09 DIAGNOSIS — I10 Essential (primary) hypertension: Secondary | ICD-10-CM | POA: Diagnosis not present

## 2018-12-09 DIAGNOSIS — E782 Mixed hyperlipidemia: Secondary | ICD-10-CM | POA: Diagnosis not present

## 2018-12-09 DIAGNOSIS — R944 Abnormal results of kidney function studies: Secondary | ICD-10-CM | POA: Diagnosis not present

## 2018-12-16 DIAGNOSIS — G894 Chronic pain syndrome: Secondary | ICD-10-CM | POA: Diagnosis not present

## 2018-12-16 DIAGNOSIS — F331 Major depressive disorder, recurrent, moderate: Secondary | ICD-10-CM | POA: Diagnosis not present

## 2018-12-16 DIAGNOSIS — E1122 Type 2 diabetes mellitus with diabetic chronic kidney disease: Secondary | ICD-10-CM | POA: Diagnosis not present

## 2018-12-16 DIAGNOSIS — N182 Chronic kidney disease, stage 2 (mild): Secondary | ICD-10-CM | POA: Diagnosis not present

## 2018-12-16 DIAGNOSIS — I129 Hypertensive chronic kidney disease with stage 1 through stage 4 chronic kidney disease, or unspecified chronic kidney disease: Secondary | ICD-10-CM | POA: Diagnosis not present

## 2018-12-16 DIAGNOSIS — Z79891 Long term (current) use of opiate analgesic: Secondary | ICD-10-CM | POA: Diagnosis not present

## 2018-12-16 DIAGNOSIS — N3281 Overactive bladder: Secondary | ICD-10-CM | POA: Diagnosis not present

## 2018-12-16 DIAGNOSIS — Z853 Personal history of malignant neoplasm of breast: Secondary | ICD-10-CM | POA: Diagnosis not present

## 2018-12-16 DIAGNOSIS — E669 Obesity, unspecified: Secondary | ICD-10-CM | POA: Diagnosis not present

## 2018-12-17 ENCOUNTER — Other Ambulatory Visit: Payer: Self-pay | Admitting: Cardiovascular Disease

## 2019-03-21 DIAGNOSIS — M4802 Spinal stenosis, cervical region: Secondary | ICD-10-CM | POA: Diagnosis not present

## 2019-03-21 DIAGNOSIS — S299XXA Unspecified injury of thorax, initial encounter: Secondary | ICD-10-CM | POA: Diagnosis not present

## 2019-03-21 DIAGNOSIS — S42331A Displaced oblique fracture of shaft of humerus, right arm, initial encounter for closed fracture: Secondary | ICD-10-CM | POA: Diagnosis not present

## 2019-03-21 DIAGNOSIS — S0093XA Contusion of unspecified part of head, initial encounter: Secondary | ICD-10-CM | POA: Diagnosis not present

## 2019-03-21 DIAGNOSIS — M47812 Spondylosis without myelopathy or radiculopathy, cervical region: Secondary | ICD-10-CM | POA: Diagnosis not present

## 2019-03-21 DIAGNOSIS — M4302 Spondylolysis, cervical region: Secondary | ICD-10-CM | POA: Diagnosis not present

## 2019-03-21 DIAGNOSIS — S0990XA Unspecified injury of head, initial encounter: Secondary | ICD-10-CM | POA: Diagnosis not present

## 2019-03-21 DIAGNOSIS — S40011A Contusion of right shoulder, initial encounter: Secondary | ICD-10-CM | POA: Diagnosis not present

## 2019-03-21 DIAGNOSIS — H9193 Unspecified hearing loss, bilateral: Secondary | ICD-10-CM | POA: Diagnosis not present

## 2019-03-21 DIAGNOSIS — W010XXA Fall on same level from slipping, tripping and stumbling without subsequent striking against object, initial encounter: Secondary | ICD-10-CM | POA: Diagnosis not present

## 2019-03-21 DIAGNOSIS — W19XXXA Unspecified fall, initial encounter: Secondary | ICD-10-CM | POA: Diagnosis not present

## 2019-03-21 DIAGNOSIS — S42351A Displaced comminuted fracture of shaft of humerus, right arm, initial encounter for closed fracture: Secondary | ICD-10-CM | POA: Diagnosis not present

## 2019-03-21 DIAGNOSIS — S42201A Unspecified fracture of upper end of right humerus, initial encounter for closed fracture: Secondary | ICD-10-CM | POA: Diagnosis not present

## 2019-03-23 DIAGNOSIS — S42295A Other nondisplaced fracture of upper end of left humerus, initial encounter for closed fracture: Secondary | ICD-10-CM | POA: Diagnosis not present

## 2019-03-30 DIAGNOSIS — S42295D Other nondisplaced fracture of upper end of left humerus, subsequent encounter for fracture with routine healing: Secondary | ICD-10-CM | POA: Diagnosis not present

## 2019-03-30 DIAGNOSIS — M25511 Pain in right shoulder: Secondary | ICD-10-CM | POA: Diagnosis not present

## 2019-04-06 ENCOUNTER — Other Ambulatory Visit: Payer: Self-pay

## 2019-04-06 MED ORDER — METOPROLOL TARTRATE 25 MG PO TABS: 25.0000 mg | ORAL_TABLET | Freq: Two times a day (BID) | ORAL | 0 refills | Status: DC

## 2019-04-15 ENCOUNTER — Other Ambulatory Visit: Payer: Self-pay | Admitting: Cardiovascular Disease

## 2019-04-17 DIAGNOSIS — E669 Obesity, unspecified: Secondary | ICD-10-CM | POA: Diagnosis not present

## 2019-04-17 DIAGNOSIS — N182 Chronic kidney disease, stage 2 (mild): Secondary | ICD-10-CM | POA: Diagnosis not present

## 2019-04-17 DIAGNOSIS — E782 Mixed hyperlipidemia: Secondary | ICD-10-CM | POA: Diagnosis not present

## 2019-04-17 DIAGNOSIS — E1122 Type 2 diabetes mellitus with diabetic chronic kidney disease: Secondary | ICD-10-CM | POA: Diagnosis not present

## 2019-04-17 DIAGNOSIS — R944 Abnormal results of kidney function studies: Secondary | ICD-10-CM | POA: Diagnosis not present

## 2019-04-17 DIAGNOSIS — Z1329 Encounter for screening for other suspected endocrine disorder: Secondary | ICD-10-CM | POA: Diagnosis not present

## 2019-04-17 DIAGNOSIS — I10 Essential (primary) hypertension: Secondary | ICD-10-CM | POA: Diagnosis not present

## 2019-04-17 DIAGNOSIS — R7301 Impaired fasting glucose: Secondary | ICD-10-CM | POA: Diagnosis not present

## 2019-04-22 DIAGNOSIS — N3281 Overactive bladder: Secondary | ICD-10-CM | POA: Diagnosis not present

## 2019-04-22 DIAGNOSIS — Z853 Personal history of malignant neoplasm of breast: Secondary | ICD-10-CM | POA: Diagnosis not present

## 2019-04-22 DIAGNOSIS — E782 Mixed hyperlipidemia: Secondary | ICD-10-CM | POA: Diagnosis not present

## 2019-04-22 DIAGNOSIS — E1122 Type 2 diabetes mellitus with diabetic chronic kidney disease: Secondary | ICD-10-CM | POA: Diagnosis not present

## 2019-04-22 DIAGNOSIS — G894 Chronic pain syndrome: Secondary | ICD-10-CM | POA: Diagnosis not present

## 2019-04-22 DIAGNOSIS — I129 Hypertensive chronic kidney disease with stage 1 through stage 4 chronic kidney disease, or unspecified chronic kidney disease: Secondary | ICD-10-CM | POA: Diagnosis not present

## 2019-04-22 DIAGNOSIS — Z79891 Long term (current) use of opiate analgesic: Secondary | ICD-10-CM | POA: Diagnosis not present

## 2019-04-22 DIAGNOSIS — F331 Major depressive disorder, recurrent, moderate: Secondary | ICD-10-CM | POA: Diagnosis not present

## 2019-04-22 DIAGNOSIS — F411 Generalized anxiety disorder: Secondary | ICD-10-CM | POA: Diagnosis not present

## 2019-05-04 DIAGNOSIS — S42295D Other nondisplaced fracture of upper end of left humerus, subsequent encounter for fracture with routine healing: Secondary | ICD-10-CM | POA: Diagnosis not present

## 2019-05-04 DIAGNOSIS — S42294D Other nondisplaced fracture of upper end of right humerus, subsequent encounter for fracture with routine healing: Secondary | ICD-10-CM | POA: Diagnosis not present

## 2019-05-21 DIAGNOSIS — S42295D Other nondisplaced fracture of upper end of left humerus, subsequent encounter for fracture with routine healing: Secondary | ICD-10-CM | POA: Diagnosis not present

## 2019-05-21 DIAGNOSIS — S42294D Other nondisplaced fracture of upper end of right humerus, subsequent encounter for fracture with routine healing: Secondary | ICD-10-CM | POA: Diagnosis not present

## 2019-08-03 ENCOUNTER — Other Ambulatory Visit: Payer: Self-pay | Admitting: Cardiovascular Disease

## 2019-08-03 DIAGNOSIS — M25552 Pain in left hip: Secondary | ICD-10-CM | POA: Diagnosis not present

## 2019-08-03 DIAGNOSIS — M545 Low back pain: Secondary | ICD-10-CM | POA: Diagnosis not present

## 2019-08-19 DIAGNOSIS — M5416 Radiculopathy, lumbar region: Secondary | ICD-10-CM | POA: Diagnosis not present

## 2019-08-19 DIAGNOSIS — I6522 Occlusion and stenosis of left carotid artery: Secondary | ICD-10-CM | POA: Diagnosis not present

## 2019-08-19 DIAGNOSIS — R1030 Lower abdominal pain, unspecified: Secondary | ICD-10-CM | POA: Diagnosis not present

## 2019-08-19 DIAGNOSIS — G894 Chronic pain syndrome: Secondary | ICD-10-CM | POA: Diagnosis not present

## 2019-08-19 DIAGNOSIS — I6521 Occlusion and stenosis of right carotid artery: Secondary | ICD-10-CM | POA: Diagnosis not present

## 2019-08-19 DIAGNOSIS — M5430 Sciatica, unspecified side: Secondary | ICD-10-CM | POA: Diagnosis not present

## 2019-08-24 DIAGNOSIS — E782 Mixed hyperlipidemia: Secondary | ICD-10-CM | POA: Diagnosis not present

## 2019-08-24 DIAGNOSIS — F17210 Nicotine dependence, cigarettes, uncomplicated: Secondary | ICD-10-CM | POA: Diagnosis not present

## 2019-08-24 DIAGNOSIS — Z0000 Encounter for general adult medical examination without abnormal findings: Secondary | ICD-10-CM | POA: Diagnosis not present

## 2019-08-24 DIAGNOSIS — E669 Obesity, unspecified: Secondary | ICD-10-CM | POA: Diagnosis not present

## 2019-08-24 DIAGNOSIS — E1122 Type 2 diabetes mellitus with diabetic chronic kidney disease: Secondary | ICD-10-CM | POA: Diagnosis not present

## 2019-08-24 DIAGNOSIS — F331 Major depressive disorder, recurrent, moderate: Secondary | ICD-10-CM | POA: Diagnosis not present

## 2019-08-24 DIAGNOSIS — F341 Dysthymic disorder: Secondary | ICD-10-CM | POA: Diagnosis not present

## 2019-08-24 DIAGNOSIS — F332 Major depressive disorder, recurrent severe without psychotic features: Secondary | ICD-10-CM | POA: Diagnosis not present

## 2019-08-24 DIAGNOSIS — C50919 Malignant neoplasm of unspecified site of unspecified female breast: Secondary | ICD-10-CM | POA: Diagnosis not present

## 2019-08-26 DIAGNOSIS — N3281 Overactive bladder: Secondary | ICD-10-CM | POA: Diagnosis not present

## 2019-08-26 DIAGNOSIS — G894 Chronic pain syndrome: Secondary | ICD-10-CM | POA: Diagnosis not present

## 2019-08-26 DIAGNOSIS — E782 Mixed hyperlipidemia: Secondary | ICD-10-CM | POA: Diagnosis not present

## 2019-08-26 DIAGNOSIS — Z853 Personal history of malignant neoplasm of breast: Secondary | ICD-10-CM | POA: Diagnosis not present

## 2019-08-26 DIAGNOSIS — F331 Major depressive disorder, recurrent, moderate: Secondary | ICD-10-CM | POA: Diagnosis not present

## 2019-08-26 DIAGNOSIS — I10 Essential (primary) hypertension: Secondary | ICD-10-CM | POA: Diagnosis not present

## 2019-08-26 DIAGNOSIS — E1169 Type 2 diabetes mellitus with other specified complication: Secondary | ICD-10-CM | POA: Diagnosis not present

## 2019-08-26 DIAGNOSIS — F411 Generalized anxiety disorder: Secondary | ICD-10-CM | POA: Diagnosis not present

## 2019-08-26 DIAGNOSIS — Z79891 Long term (current) use of opiate analgesic: Secondary | ICD-10-CM | POA: Diagnosis not present

## 2019-08-27 ENCOUNTER — Other Ambulatory Visit: Payer: Self-pay | Admitting: Dermatology

## 2019-08-27 DIAGNOSIS — Z1231 Encounter for screening mammogram for malignant neoplasm of breast: Secondary | ICD-10-CM

## 2019-11-10 ENCOUNTER — Telehealth: Payer: Self-pay | Admitting: Student

## 2019-11-10 ENCOUNTER — Other Ambulatory Visit: Payer: Self-pay | Admitting: Cardiovascular Disease

## 2019-11-10 NOTE — Telephone Encounter (Signed)
     [facility] patient and refill of Atorvastatin was requested. No recent FLP or LFT's available for review. Please obtain a copy of recent labs from her PCP (Dr. Nevada Crane). If not obtained within the past year, will need to arrange FLP and LFT's.   Signed, Erma Heritage, PA-C 11/10/2019, 3:17 PM Pager: [phone]

## 2019-11-10 NOTE — Telephone Encounter (Signed)
Requested labs from [facility]

## 2019-11-17 NOTE — Telephone Encounter (Signed)
Current labs from [facility] reviewed by  B.Strader, PA-C, to be scanned.

## 2020-03-18 DIAGNOSIS — Z720 Tobacco use: Secondary | ICD-10-CM | POA: Diagnosis not present

## 2020-03-18 DIAGNOSIS — E782 Mixed hyperlipidemia: Secondary | ICD-10-CM | POA: Diagnosis not present

## 2020-03-18 DIAGNOSIS — R944 Abnormal results of kidney function studies: Secondary | ICD-10-CM | POA: Diagnosis not present

## 2020-03-18 DIAGNOSIS — M5416 Radiculopathy, lumbar region: Secondary | ICD-10-CM | POA: Diagnosis not present

## 2020-03-18 DIAGNOSIS — R7301 Impaired fasting glucose: Secondary | ICD-10-CM | POA: Diagnosis not present

## 2020-03-18 DIAGNOSIS — H34219 Partial retinal artery occlusion, unspecified eye: Secondary | ICD-10-CM | POA: Diagnosis not present

## 2020-03-18 DIAGNOSIS — F331 Major depressive disorder, recurrent, moderate: Secondary | ICD-10-CM | POA: Diagnosis not present

## 2020-03-18 DIAGNOSIS — G894 Chronic pain syndrome: Secondary | ICD-10-CM | POA: Diagnosis not present

## 2020-03-18 DIAGNOSIS — Z853 Personal history of malignant neoplasm of breast: Secondary | ICD-10-CM | POA: Diagnosis not present

## 2020-03-18 DIAGNOSIS — Z0000 Encounter for general adult medical examination without abnormal findings: Secondary | ICD-10-CM | POA: Diagnosis not present

## 2020-03-18 DIAGNOSIS — F411 Generalized anxiety disorder: Secondary | ICD-10-CM | POA: Diagnosis not present

## 2020-03-25 DIAGNOSIS — G894 Chronic pain syndrome: Secondary | ICD-10-CM | POA: Diagnosis not present

## 2020-03-25 DIAGNOSIS — Z23 Encounter for immunization: Secondary | ICD-10-CM | POA: Diagnosis not present

## 2020-03-25 DIAGNOSIS — Z79891 Long term (current) use of opiate analgesic: Secondary | ICD-10-CM | POA: Diagnosis not present

## 2020-03-25 DIAGNOSIS — E1169 Type 2 diabetes mellitus with other specified complication: Secondary | ICD-10-CM | POA: Diagnosis not present

## 2020-03-25 DIAGNOSIS — F411 Generalized anxiety disorder: Secondary | ICD-10-CM | POA: Diagnosis not present

## 2020-03-25 DIAGNOSIS — E119 Type 2 diabetes mellitus without complications: Secondary | ICD-10-CM | POA: Diagnosis not present

## 2020-03-25 DIAGNOSIS — F17218 Nicotine dependence, cigarettes, with other nicotine-induced disorders: Secondary | ICD-10-CM | POA: Diagnosis not present

## 2020-03-25 DIAGNOSIS — I10 Essential (primary) hypertension: Secondary | ICD-10-CM | POA: Diagnosis not present

## 2020-03-25 DIAGNOSIS — Z716 Tobacco abuse counseling: Secondary | ICD-10-CM | POA: Diagnosis not present

## 2020-03-25 DIAGNOSIS — F331 Major depressive disorder, recurrent, moderate: Secondary | ICD-10-CM | POA: Diagnosis not present

## 2020-03-25 DIAGNOSIS — E782 Mixed hyperlipidemia: Secondary | ICD-10-CM | POA: Diagnosis not present

## 2020-03-25 DIAGNOSIS — Z853 Personal history of malignant neoplasm of breast: Secondary | ICD-10-CM | POA: Diagnosis not present

## 2020-03-25 DIAGNOSIS — N3281 Overactive bladder: Secondary | ICD-10-CM | POA: Diagnosis not present

## 2020-08-01 DIAGNOSIS — N182 Chronic kidney disease, stage 2 (mild): Secondary | ICD-10-CM | POA: Diagnosis not present

## 2020-08-01 DIAGNOSIS — M5416 Radiculopathy, lumbar region: Secondary | ICD-10-CM | POA: Diagnosis not present

## 2020-08-01 DIAGNOSIS — F331 Major depressive disorder, recurrent, moderate: Secondary | ICD-10-CM | POA: Diagnosis not present

## 2020-08-01 DIAGNOSIS — E559 Vitamin D deficiency, unspecified: Secondary | ICD-10-CM | POA: Diagnosis not present

## 2020-08-01 DIAGNOSIS — F411 Generalized anxiety disorder: Secondary | ICD-10-CM | POA: Diagnosis not present

## 2020-08-01 DIAGNOSIS — Z0000 Encounter for general adult medical examination without abnormal findings: Secondary | ICD-10-CM | POA: Diagnosis not present

## 2020-08-01 DIAGNOSIS — R809 Proteinuria, unspecified: Secondary | ICD-10-CM | POA: Diagnosis not present

## 2020-08-01 DIAGNOSIS — G894 Chronic pain syndrome: Secondary | ICD-10-CM | POA: Diagnosis not present

## 2020-08-01 DIAGNOSIS — Z853 Personal history of malignant neoplasm of breast: Secondary | ICD-10-CM | POA: Diagnosis not present

## 2020-08-01 DIAGNOSIS — Z0001 Encounter for general adult medical examination with abnormal findings: Secondary | ICD-10-CM | POA: Diagnosis not present

## 2020-08-01 DIAGNOSIS — H34219 Partial retinal artery occlusion, unspecified eye: Secondary | ICD-10-CM | POA: Diagnosis not present

## 2020-08-11 DIAGNOSIS — F331 Major depressive disorder, recurrent, moderate: Secondary | ICD-10-CM | POA: Diagnosis not present

## 2020-08-11 DIAGNOSIS — G894 Chronic pain syndrome: Secondary | ICD-10-CM | POA: Diagnosis not present

## 2020-08-11 DIAGNOSIS — N3281 Overactive bladder: Secondary | ICD-10-CM | POA: Diagnosis not present

## 2020-08-11 DIAGNOSIS — F411 Generalized anxiety disorder: Secondary | ICD-10-CM | POA: Diagnosis not present

## 2020-08-11 DIAGNOSIS — E1169 Type 2 diabetes mellitus with other specified complication: Secondary | ICD-10-CM | POA: Diagnosis not present

## 2020-08-11 DIAGNOSIS — Z853 Personal history of malignant neoplasm of breast: Secondary | ICD-10-CM | POA: Diagnosis not present

## 2020-08-11 DIAGNOSIS — I10 Essential (primary) hypertension: Secondary | ICD-10-CM | POA: Diagnosis not present

## 2020-08-11 DIAGNOSIS — Z79891 Long term (current) use of opiate analgesic: Secondary | ICD-10-CM | POA: Diagnosis not present

## 2020-08-11 DIAGNOSIS — E782 Mixed hyperlipidemia: Secondary | ICD-10-CM | POA: Diagnosis not present

## 2020-09-12 ENCOUNTER — Other Ambulatory Visit: Payer: Self-pay | Admitting: Student

## 2020-11-21 ENCOUNTER — Other Ambulatory Visit: Payer: Self-pay | Admitting: Student

## 2020-12-09 DIAGNOSIS — Z1329 Encounter for screening for other suspected endocrine disorder: Secondary | ICD-10-CM | POA: Diagnosis not present

## 2020-12-09 DIAGNOSIS — I10 Essential (primary) hypertension: Secondary | ICD-10-CM | POA: Diagnosis not present

## 2020-12-09 DIAGNOSIS — E559 Vitamin D deficiency, unspecified: Secondary | ICD-10-CM | POA: Diagnosis not present

## 2020-12-13 ENCOUNTER — Other Ambulatory Visit: Payer: Self-pay | Admitting: Student

## 2020-12-15 DIAGNOSIS — E1169 Type 2 diabetes mellitus with other specified complication: Secondary | ICD-10-CM | POA: Diagnosis not present

## 2020-12-15 DIAGNOSIS — F411 Generalized anxiety disorder: Secondary | ICD-10-CM | POA: Diagnosis not present

## 2020-12-15 DIAGNOSIS — I10 Essential (primary) hypertension: Secondary | ICD-10-CM | POA: Diagnosis not present

## 2020-12-15 DIAGNOSIS — Z853 Personal history of malignant neoplasm of breast: Secondary | ICD-10-CM | POA: Diagnosis not present

## 2020-12-15 DIAGNOSIS — E782 Mixed hyperlipidemia: Secondary | ICD-10-CM | POA: Diagnosis not present

## 2020-12-15 DIAGNOSIS — R0602 Shortness of breath: Secondary | ICD-10-CM | POA: Diagnosis not present

## 2020-12-15 DIAGNOSIS — F17218 Nicotine dependence, cigarettes, with other nicotine-induced disorders: Secondary | ICD-10-CM | POA: Diagnosis not present

## 2020-12-15 DIAGNOSIS — N3281 Overactive bladder: Secondary | ICD-10-CM | POA: Diagnosis not present

## 2020-12-15 DIAGNOSIS — F331 Major depressive disorder, recurrent, moderate: Secondary | ICD-10-CM | POA: Diagnosis not present

## 2020-12-20 ENCOUNTER — Other Ambulatory Visit: Payer: Self-pay | Admitting: Student

## 2020-12-21 ENCOUNTER — Other Ambulatory Visit (HOSPITAL_COMMUNITY): Payer: Self-pay | Admitting: Internal Medicine

## 2020-12-21 DIAGNOSIS — N63 Unspecified lump in unspecified breast: Secondary | ICD-10-CM

## 2021-01-10 ENCOUNTER — Encounter (HOSPITAL_COMMUNITY): Payer: Self-pay

## 2021-01-10 ENCOUNTER — Ambulatory Visit (HOSPITAL_COMMUNITY): Payer: Medicare HMO

## 2021-01-10 ENCOUNTER — Other Ambulatory Visit: Payer: Self-pay | Admitting: Student

## 2021-01-10 ENCOUNTER — Encounter (HOSPITAL_COMMUNITY): Payer: Medicare HMO

## 2021-01-25 ENCOUNTER — Other Ambulatory Visit: Payer: Self-pay | Admitting: Student

## 2021-01-26 NOTE — Telephone Encounter (Signed)
This is a [place] pt.  °

## 2021-01-29 ENCOUNTER — Other Ambulatory Visit: Payer: Self-pay | Admitting: Student

## 2021-02-23 DIAGNOSIS — H34211 Partial retinal artery occlusion, right eye: Secondary | ICD-10-CM | POA: Diagnosis not present

## 2021-02-23 DIAGNOSIS — H25811 Combined forms of age-related cataract, right eye: Secondary | ICD-10-CM | POA: Diagnosis not present

## 2021-02-23 DIAGNOSIS — D3131 Benign neoplasm of right choroid: Secondary | ICD-10-CM | POA: Diagnosis not present

## 2021-03-09 ENCOUNTER — Other Ambulatory Visit: Payer: Self-pay | Admitting: Student

## 2021-03-17 ENCOUNTER — Other Ambulatory Visit: Payer: Self-pay | Admitting: Student

## 2021-03-18 ENCOUNTER — Emergency Department (HOSPITAL_COMMUNITY)
Admission: EM | Admit: 2021-03-18 | Discharge: 2021-03-18 | Disposition: A | Discharge status: Home / Self Care | Payer: Medicare HMO | Attending: Emergency Medicine | Admitting: Emergency Medicine

## 2021-03-18 ENCOUNTER — Encounter (HOSPITAL_COMMUNITY): Payer: Self-pay | Admitting: Emergency Medicine

## 2021-03-18 ENCOUNTER — Emergency Department (HOSPITAL_COMMUNITY): Payer: Medicare HMO

## 2021-03-18 ENCOUNTER — Other Ambulatory Visit: Payer: Self-pay

## 2021-03-18 DIAGNOSIS — J449 Chronic obstructive pulmonary disease, unspecified: Secondary | ICD-10-CM | POA: Insufficient documentation

## 2021-03-18 DIAGNOSIS — W010XXA Fall on same level from slipping, tripping and stumbling without subsequent striking against object, initial encounter: Secondary | ICD-10-CM | POA: Diagnosis not present

## 2021-03-18 DIAGNOSIS — F17210 Nicotine dependence, cigarettes, uncomplicated: Secondary | ICD-10-CM | POA: Insufficient documentation

## 2021-03-18 DIAGNOSIS — Z853 Personal history of malignant neoplasm of breast: Secondary | ICD-10-CM | POA: Diagnosis not present

## 2021-03-18 DIAGNOSIS — N189 Chronic kidney disease, unspecified: Secondary | ICD-10-CM | POA: Diagnosis not present

## 2021-03-18 DIAGNOSIS — M7989 Other specified soft tissue disorders: Secondary | ICD-10-CM | POA: Diagnosis not present

## 2021-03-18 DIAGNOSIS — J45909 Unspecified asthma, uncomplicated: Secondary | ICD-10-CM | POA: Insufficient documentation

## 2021-03-18 DIAGNOSIS — Z79899 Other long term (current) drug therapy: Secondary | ICD-10-CM | POA: Diagnosis not present

## 2021-03-18 DIAGNOSIS — S6992XA Unspecified injury of left wrist, hand and finger(s), initial encounter: Secondary | ICD-10-CM | POA: Diagnosis present

## 2021-03-18 DIAGNOSIS — S52502A Unspecified fracture of the lower end of left radius, initial encounter for closed fracture: Secondary | ICD-10-CM | POA: Insufficient documentation

## 2021-03-18 DIAGNOSIS — I129 Hypertensive chronic kidney disease with stage 1 through stage 4 chronic kidney disease, or unspecified chronic kidney disease: Secondary | ICD-10-CM | POA: Diagnosis not present

## 2021-03-18 DIAGNOSIS — Z7982 Long term (current) use of aspirin: Secondary | ICD-10-CM | POA: Insufficient documentation

## 2021-03-18 MED ORDER — IBUPROFEN 800 MG PO TABS
800.0000 mg | ORAL_TABLET | Freq: Once | ORAL | Status: AC
  Administered 2021-03-18: 800 mg via ORAL
  Filled 2021-03-18: qty 1

## 2021-03-18 MED ORDER — HYDROCODONE-ACETAMINOPHEN 5-325 MG PO TABS
1.0000 | ORAL_TABLET | Freq: Once | ORAL | Status: AC
  Administered 2021-03-18: 1 via ORAL
  Filled 2021-03-18: qty 1

## 2021-03-18 NOTE — ED Notes (Signed)
Sugar tong splint applied by tech as well as shoulder sling applied.

## 2021-03-18 NOTE — ED Triage Notes (Signed)
Pt to the ED with complaints of left wrist pain after a fall last night.  Swelling and bruising noted.

## 2021-03-18 NOTE — Discharge Instructions (Addendum)
Take your chronic pain meds as needed for pain.

## 2021-03-18 NOTE — ED Provider Notes (Signed)
[facility] EMERGENCY DEPARTMENT Provider Note   CSN: [id number] Arrival date & time: 03/18/21  1441     History Chief Complaint  Patient presents with   Wrist Pain    Ashley Doyle is a 75 y.o. female.  Pt presents to the ED today with left wrist pain.  Pt tripped on a board on the floor and landed on her left arm.  She denies any other pain.  She did not hit her head.  No loc.  Pt has bruising/swelling to her left wrist.      Past Medical History:  Diagnosis Date   Anxiety    Arthritis    Asthma    not on inhalers now- no flares recently   Breast cancer ([place])    Chronic kidney disease    "as a child" "severe infection and it was toxic"   Constipation    COPD (chronic obstructive pulmonary disease) (HCC)    Depression    Dyspnea    with exertion   Emphysema of lung ([place])    Family history of adverse reaction to anesthesia    sister has PONV   GERD (gastroesophageal reflux disease)    Headache    history of migraines- none since hysterectomy- has bad headaches at times   History of chemotherapy    HOH (hard of hearing)    Hypertension    Invasive ductal carcinoma of breast, female, right ([place]) 03/07/2016   Peripheral vascular disease ([place])    Pinched nerve in neck    and back   Sciatica    Stroke ([place])    2 - right sided weakness- right leg - "speech expressive aphasia when nervous"   Tuberculosis    "several years ago" 04/11/16   Wears glasses    Wears hearing aid    left ear    Patient Active Problem List   Diagnosis Date Noted   Asymptomatic stenosis of right carotid artery without infarction 05/09/2018   Carotid stenosis 01/03/2018   Carotid body tumor ([place]) 01/02/2018   Breast cancer, right ([place]) 11/22/2016   Tobacco abuse 03/11/2016   Invasive ductal carcinoma of breast, female, right ([place]) 03/07/2016    Past Surgical History:  Procedure Laterality Date   ABDOMINAL HYSTERECTOMY     APPENDECTOMY     Arm surgery Right    injury- cut with  glass   BREAST SURGERY     CAROTID BODY TUMOR EXCISION Left 01/03/2018   Procedure: TUMOR EXCISION CAROTID BODY LEFT;  Surgeon: Serafina Mitchell, MD;  Location: [facility] OR;  Service: Vascular;  Laterality: Left;   CAROTID ENDARTERECTOMY Right 05/09/2018   ENDARTERECTOMY Left 01/03/2018   Procedure: ENDARTERECTOMY CAROTID LEFT;  Surgeon: Serafina Mitchell, MD;  Location: [facility] OR;  Service: Vascular;  Laterality: Left;   ENDARTERECTOMY Right 05/09/2018   Procedure: ENDARTERECTOMY CAROTID RIGHT;  Surgeon: Serafina Mitchell, MD;  Location: [facility] OR;  Service: Vascular;  Laterality: Right;   INNER EAR SURGERY Right    IR ANGIO EXTERNAL CAROTID SEL EXT CAROTID UNI L MOD SED  01/02/2018   IR ANGIO INTRA EXTRACRAN SEL COM CAROTID INNOMINATE BILAT MOD SED  01/02/2018   IR ANGIO VERTEBRAL SEL SUBCLAVIAN INNOMINATE UNI R MOD SED  01/02/2018   IR ANGIOGRAM FOLLOW UP STUDY  01/02/2018   IR NEURO EACH ADD'L AFTER BASIC UNI LEFT (MS)  01/02/2018   IR TRANSCATH/EMBOLIZ  01/02/2018   MASTECTOMY WITH RADIOACTIVE SEED GUIDED EXCISION AND AXILLARY SENTINEL LYMPH NODE BIOPSY Right 11/22/2016  Procedure: REMOVAL OF RIGHT BREAST RADIOACTIVE SEED, RIGHT TOTAL MASTECTOMY WITH RADIOACTIVE SEED TARGETED RIGHT AXILLARY LYMPH NODE EXCISION AND AXILLARY SENTINEL LYMPH NODE BIOPSY, POSSIBLE RIGHT AXILLARY DISSECTION;  Surgeon: Rolm Bookbinder, MD;  Location: [facility];  Service: General;  Laterality: Right;   PATCH ANGIOPLASTY Left 01/03/2018   Procedure: LEFT CAROTID ARTERY PATCH ANGIOPLASTY USING XENOSURE BIOLOGIC PATCH;  Surgeon: Serafina Mitchell, MD;  Location: [facility];  Service: Vascular;  Laterality: Left;   PORT-A-CATH REMOVAL Left 11/22/2016   Procedure: REMOVAL PORT-A-CATH;  Surgeon: Rolm Bookbinder, MD;  Location: [facility];  Service: General;  Laterality: Left;   PORTACATH PLACEMENT N/A 04/12/2016   Procedure: INSERTION PORT-A-CATH WITH [place];  Surgeon: Rolm Bookbinder, MD;  Location: [facility];  Service: General;  Laterality: N/A;    RADIOLOGY WITH ANESTHESIA N/A 01/02/2018   Procedure: EMBOLIZATION OF CAROTID BODY TUMOR;  Surgeon: Luanne Bras, MD;  Location: [facility];  Service: Radiology;  Laterality: N/A;     OB History   No obstetric history on file.     Family History  Problem Relation Age of Onset   Stroke Mother    Heart disease Mother    Lung cancer Father     Social History   Tobacco Use   Smoking status: Every Day    Packs/day: 1.50    Years: 55.00    Pack years: 82.50    Types: Cigarettes   Smokeless tobacco: Never   Tobacco comments:    "30-32 cigarettes per day"  Vaping Use   Vaping Use: Never used  Substance Use Topics   Alcohol use: No   Drug use: No    Home Medications Prior to Admission medications   Medication Sig Start Date End Date Taking? Authorizing Provider  Artificial Tear Ointment (DRY EYES OP) Place 1 drop into both eyes daily as needed (for dry eyes).    [provider]  aspirin EC 81 MG tablet Take 1 tablet (81 mg total) by mouth daily. 11/18/17   Herminio Commons, MD  atorvastatin (LIPITOR) 40 MG tablet Take 1 tablet (40 mg total) by mouth daily. Pt needs to make appt with provider for additional refills - 2nd attempt 03/09/21   Ahmed Prima, Fransisco Hertz, PA-C  citalopram (CELEXA) 40 MG tablet Take 40 mg by mouth daily. 01/28/16   [provider]  docusate sodium (COLACE) 100 MG capsule Take 100 mg by mouth daily as needed for mild constipation.    [provider]  gabapentin (NEURONTIN) 300 MG capsule Take 1 capsule (300 mg total) by mouth 3 (three) times daily. 12/21/16   Holley Bouche, NP  LORazepam (ATIVAN) 0.5 MG tablet Take 1 tablet (0.5 mg total) by mouth every 8 (eight) hours. Patient taking differently: Take 0.5 mg by mouth every 8 (eight) hours as needed for anxiety.  09/19/16   Twana First, MD  methocarbamol (ROBAXIN) 750 MG tablet Take 1 tablet (750 mg total) by mouth every 8 (eight) hours as needed (use for muscle cramps/pain).  11/23/16   Rolm Bookbinder, MD  metoprolol tartrate (LOPRESSOR) 25 MG tablet TAKE 1 TABLET BY MOUTH TWICE A DAY 08/03/19   Herminio Commons, MD  omeprazole (PRILOSEC) 20 MG capsule TAKE 1 CAPSULE BY MOUTH EVERY DAY Patient taking differently: Take 20 mg by mouth daily.  07/07/17   Holley Bouche, NP  oxyCODONE (OXY IR/ROXICODONE) 5 MG immediate release tablet Take 1 tablet (5 mg total) by mouth every 6 (six) hours as needed for moderate pain, severe  pain or breakthrough pain. 01/03/18   Ulyses Amor, PA-C  oxyCODONE-acetaminophen (PERCOCET) 10-325 MG tablet Take 1 tablet by mouth every 8 (eight) hours as needed for pain.    [provider]  triamterene-hydrochlorothiazide (MAXZIDE-25) 37.5-25 MG tablet Take 1 tablet by mouth daily. 02/02/16   [provider]    Allergies    Patient has no known allergies.  Review of Systems   Review of Systems  Musculoskeletal:        Left wrist pain and swelling  All other systems reviewed and are negative.  Physical Exam Updated Vital Signs BP 114/68   Pulse 68   Temp 98.3 F (36.8 C) (Oral)   Resp 17   Ht 5\' 2"  (1.575 m)   Wt 77.1 kg   SpO2 95%   BMI 31.09 kg/m   Physical Exam Vitals and nursing note reviewed.  Constitutional:      Appearance: Normal appearance.  HENT:     Head: Normocephalic and atraumatic.     Right Ear: External ear normal.     Left Ear: External ear normal.     Nose: Nose normal.     Mouth/Throat:     Mouth: Mucous membranes are moist.     Pharynx: Oropharynx is clear.  Eyes:     Extraocular Movements: Extraocular movements intact.     Conjunctiva/sclera: Conjunctivae normal.     Pupils: Pupils are equal, round, and reactive to light.  Cardiovascular:     Rate and Rhythm: Normal rate and regular rhythm.     Pulses: Normal pulses.     Heart sounds: Normal heart sounds.  Pulmonary:     Effort: Pulmonary effort is normal.     Breath sounds: Normal breath sounds.  Abdominal:      General: Abdomen is flat. Bowel sounds are normal.     Palpations: Abdomen is soft.  Musculoskeletal:     Left wrist: Swelling, deformity, tenderness and bony tenderness present. Decreased range of motion.     Cervical back: Normal range of motion and neck supple.  Skin:    General: Skin is warm.     Capillary Refill: Capillary refill takes less than 2 seconds.  Neurological:     General: No focal deficit present.     Mental Status: She is alert and oriented to person, place, and time.  Psychiatric:        Mood and Affect: Mood normal.    ED Results / Procedures / Treatments   Labs (all labs ordered are listed, but only abnormal results are displayed) Labs Reviewed - No data to display  EKG None  Radiology DG Wrist Complete Left  Result Date: 03/18/2021 CLINICAL DATA:  Status post fall with pain and swelling. EXAM: LEFT WRIST - COMPLETE 3+ VIEW COMPARISON:  None. FINDINGS: There is a comminuted impacted minimally displaced fracture of the left distal radius, with extension to the radiocarpal articulation. The carpus is normally aligned. There is associated significant soft tissue swelling. IMPRESSION: Comminuted impacted minimally displaced intra-articular fracture of the left distal radius. Electronically Signed   By: Fidela Salisbury M.D.   On: 03/18/2021 16:39    Procedures .Ortho Injury Treatment  Date/Time: 03/18/2021 4:45 PM Performed by: Isla Pence, MD Authorized by: Isla Pence, MD   Consent:    Consent obtained:  Verbal   Consent given by:  Patient   Risks discussed:  FractureInjury location: wrist Location details: left wrist Injury type: fracture Fracture type: distal radius Splint Applied by: ED  Nurse, ED Provider and ED Tech Supplies used: Ortho-Glass, cotton padding and elastic bandage Post-procedure neurovascular assessment: post-procedure neurovascularly intact Post-procedure distal perfusion: normal Post-procedure neurological function:  normal Post-procedure range of motion: normal     Medications Ordered in ED Medications  ibuprofen (ADVIL) tablet 800 mg (800 mg Oral Given 03/18/21 1642)  HYDROcodone-acetaminophen (NORCO/VICODIN) 5-325 MG per tablet 1 tablet (1 tablet Oral Given 03/18/21 1642)    ED Course  I have reviewed the triage vital signs and the nursing notes.  Pertinent labs & imaging results that were available during my care of the patient were reviewed by me and considered in my medical decision making (see chart for details).    MDM Rules/Calculators/A&P                           Pt placed in a sugar tong splint and given a sling.  She is to f/u with ortho.  Pt does take chronic pain meds (percocet 10-325) at home.  She is to continue those for pain.  Keep wrist elevated and iced.  F/u with ortho.  Return if worse.  Final Clinical Impression(s) / ED Diagnoses Final diagnoses:  Closed fracture of distal end of left radius, unspecified fracture morphology, initial encounter    Rx / DC Orders ED Discharge Orders     None        Isla Pence, MD 03/18/21 1722

## 2021-04-12 DIAGNOSIS — I10 Essential (primary) hypertension: Secondary | ICD-10-CM | POA: Diagnosis not present

## 2021-04-12 DIAGNOSIS — N3281 Overactive bladder: Secondary | ICD-10-CM | POA: Diagnosis not present

## 2021-04-12 DIAGNOSIS — F411 Generalized anxiety disorder: Secondary | ICD-10-CM | POA: Diagnosis not present

## 2021-04-12 DIAGNOSIS — M79645 Pain in left finger(s): Secondary | ICD-10-CM | POA: Diagnosis not present

## 2021-04-12 DIAGNOSIS — S6292XD Unspecified fracture of left wrist and hand, subsequent encounter for fracture with routine healing: Secondary | ICD-10-CM | POA: Diagnosis not present

## 2021-04-12 DIAGNOSIS — F331 Major depressive disorder, recurrent, moderate: Secondary | ICD-10-CM | POA: Diagnosis not present

## 2021-04-12 DIAGNOSIS — F17218 Nicotine dependence, cigarettes, with other nicotine-induced disorders: Secondary | ICD-10-CM | POA: Diagnosis not present

## 2021-04-12 DIAGNOSIS — G894 Chronic pain syndrome: Secondary | ICD-10-CM | POA: Diagnosis not present

## 2021-04-12 DIAGNOSIS — E782 Mixed hyperlipidemia: Secondary | ICD-10-CM | POA: Diagnosis not present

## 2021-06-23 DIAGNOSIS — E1165 Type 2 diabetes mellitus with hyperglycemia: Secondary | ICD-10-CM | POA: Diagnosis not present

## 2021-06-23 DIAGNOSIS — E1169 Type 2 diabetes mellitus with other specified complication: Secondary | ICD-10-CM | POA: Diagnosis not present

## 2021-06-23 DIAGNOSIS — E782 Mixed hyperlipidemia: Secondary | ICD-10-CM | POA: Diagnosis not present

## 2021-06-27 DIAGNOSIS — I10 Essential (primary) hypertension: Secondary | ICD-10-CM | POA: Diagnosis not present

## 2021-06-27 DIAGNOSIS — F331 Major depressive disorder, recurrent, moderate: Secondary | ICD-10-CM | POA: Diagnosis not present

## 2021-06-27 DIAGNOSIS — E782 Mixed hyperlipidemia: Secondary | ICD-10-CM | POA: Diagnosis not present

## 2021-06-27 DIAGNOSIS — N3281 Overactive bladder: Secondary | ICD-10-CM | POA: Diagnosis not present

## 2021-06-27 DIAGNOSIS — F411 Generalized anxiety disorder: Secondary | ICD-10-CM | POA: Diagnosis not present

## 2021-06-27 DIAGNOSIS — F17218 Nicotine dependence, cigarettes, with other nicotine-induced disorders: Secondary | ICD-10-CM | POA: Diagnosis not present

## 2021-06-27 DIAGNOSIS — E1169 Type 2 diabetes mellitus with other specified complication: Secondary | ICD-10-CM | POA: Diagnosis not present

## 2021-06-27 DIAGNOSIS — G894 Chronic pain syndrome: Secondary | ICD-10-CM | POA: Diagnosis not present

## 2021-06-27 DIAGNOSIS — R0602 Shortness of breath: Secondary | ICD-10-CM | POA: Diagnosis not present

## 2021-08-02 ENCOUNTER — Other Ambulatory Visit (HOSPITAL_COMMUNITY): Payer: Self-pay | Admitting: Internal Medicine

## 2021-08-02 DIAGNOSIS — Z13820 Encounter for screening for osteoporosis: Secondary | ICD-10-CM

## 2021-08-09 ENCOUNTER — Other Ambulatory Visit (HOSPITAL_COMMUNITY): Payer: Medicare HMO

## 2021-09-25 DIAGNOSIS — E1169 Type 2 diabetes mellitus with other specified complication: Secondary | ICD-10-CM | POA: Diagnosis not present

## 2021-09-25 DIAGNOSIS — E782 Mixed hyperlipidemia: Secondary | ICD-10-CM | POA: Diagnosis not present

## 2021-09-29 DIAGNOSIS — G894 Chronic pain syndrome: Secondary | ICD-10-CM | POA: Diagnosis not present

## 2021-09-29 DIAGNOSIS — N3281 Overactive bladder: Secondary | ICD-10-CM | POA: Diagnosis not present

## 2021-09-29 DIAGNOSIS — I10 Essential (primary) hypertension: Secondary | ICD-10-CM | POA: Diagnosis not present

## 2021-09-29 DIAGNOSIS — E782 Mixed hyperlipidemia: Secondary | ICD-10-CM | POA: Diagnosis not present

## 2021-09-29 DIAGNOSIS — E1169 Type 2 diabetes mellitus with other specified complication: Secondary | ICD-10-CM | POA: Diagnosis not present

## 2021-09-29 DIAGNOSIS — F411 Generalized anxiety disorder: Secondary | ICD-10-CM | POA: Diagnosis not present

## 2021-09-29 DIAGNOSIS — J449 Chronic obstructive pulmonary disease, unspecified: Secondary | ICD-10-CM | POA: Diagnosis not present

## 2021-09-29 DIAGNOSIS — Z853 Personal history of malignant neoplasm of breast: Secondary | ICD-10-CM | POA: Diagnosis not present

## 2021-09-29 DIAGNOSIS — F331 Major depressive disorder, recurrent, moderate: Secondary | ICD-10-CM | POA: Diagnosis not present

## 2021-09-29 DIAGNOSIS — F17218 Nicotine dependence, cigarettes, with other nicotine-induced disorders: Secondary | ICD-10-CM | POA: Diagnosis not present

## 2022-01-05 DIAGNOSIS — E782 Mixed hyperlipidemia: Secondary | ICD-10-CM | POA: Diagnosis not present

## 2022-01-05 DIAGNOSIS — E1169 Type 2 diabetes mellitus with other specified complication: Secondary | ICD-10-CM | POA: Diagnosis not present

## 2022-01-10 DIAGNOSIS — Z853 Personal history of malignant neoplasm of breast: Secondary | ICD-10-CM | POA: Diagnosis not present

## 2022-01-10 DIAGNOSIS — E1169 Type 2 diabetes mellitus with other specified complication: Secondary | ICD-10-CM | POA: Diagnosis not present

## 2022-01-10 DIAGNOSIS — F411 Generalized anxiety disorder: Secondary | ICD-10-CM | POA: Diagnosis not present

## 2022-01-10 DIAGNOSIS — Z0001 Encounter for general adult medical examination with abnormal findings: Secondary | ICD-10-CM | POA: Diagnosis not present

## 2022-01-10 DIAGNOSIS — G894 Chronic pain syndrome: Secondary | ICD-10-CM | POA: Diagnosis not present

## 2022-01-10 DIAGNOSIS — F331 Major depressive disorder, recurrent, moderate: Secondary | ICD-10-CM | POA: Diagnosis not present

## 2022-01-10 DIAGNOSIS — N3281 Overactive bladder: Secondary | ICD-10-CM | POA: Diagnosis not present

## 2022-01-10 DIAGNOSIS — I10 Essential (primary) hypertension: Secondary | ICD-10-CM | POA: Diagnosis not present

## 2022-01-10 DIAGNOSIS — E782 Mixed hyperlipidemia: Secondary | ICD-10-CM | POA: Diagnosis not present

## 2022-05-15 DIAGNOSIS — E1169 Type 2 diabetes mellitus with other specified complication: Secondary | ICD-10-CM | POA: Diagnosis not present

## 2022-05-15 DIAGNOSIS — E782 Mixed hyperlipidemia: Secondary | ICD-10-CM | POA: Diagnosis not present

## 2022-06-18 DIAGNOSIS — Z853 Personal history of malignant neoplasm of breast: Secondary | ICD-10-CM | POA: Diagnosis not present

## 2022-06-18 DIAGNOSIS — E1169 Type 2 diabetes mellitus with other specified complication: Secondary | ICD-10-CM | POA: Diagnosis not present

## 2022-06-18 DIAGNOSIS — E782 Mixed hyperlipidemia: Secondary | ICD-10-CM | POA: Diagnosis not present

## 2022-06-18 DIAGNOSIS — F17218 Nicotine dependence, cigarettes, with other nicotine-induced disorders: Secondary | ICD-10-CM | POA: Diagnosis not present

## 2022-06-18 DIAGNOSIS — G894 Chronic pain syndrome: Secondary | ICD-10-CM | POA: Diagnosis not present

## 2022-06-18 DIAGNOSIS — F411 Generalized anxiety disorder: Secondary | ICD-10-CM | POA: Diagnosis not present

## 2022-06-18 DIAGNOSIS — F331 Major depressive disorder, recurrent, moderate: Secondary | ICD-10-CM | POA: Diagnosis not present

## 2022-06-18 DIAGNOSIS — I10 Essential (primary) hypertension: Secondary | ICD-10-CM | POA: Diagnosis not present

## 2022-06-18 DIAGNOSIS — N3281 Overactive bladder: Secondary | ICD-10-CM | POA: Diagnosis not present

## 2022-07-11 ENCOUNTER — Other Ambulatory Visit: Payer: Self-pay | Admitting: *Deleted

## 2022-07-11 DIAGNOSIS — I6529 Occlusion and stenosis of unspecified carotid artery: Secondary | ICD-10-CM

## 2022-07-18 DIAGNOSIS — M25519 Pain in unspecified shoulder: Secondary | ICD-10-CM | POA: Diagnosis not present

## 2022-07-18 DIAGNOSIS — M25511 Pain in right shoulder: Secondary | ICD-10-CM | POA: Diagnosis not present

## 2022-07-23 ENCOUNTER — Encounter: Payer: Medicare HMO | Admitting: Surgery

## 2022-07-23 ENCOUNTER — Ambulatory Visit (HOSPITAL_COMMUNITY): Payer: Medicare HMO

## 2022-10-12 DIAGNOSIS — E782 Mixed hyperlipidemia: Secondary | ICD-10-CM | POA: Diagnosis not present

## 2022-10-12 DIAGNOSIS — E1169 Type 2 diabetes mellitus with other specified complication: Secondary | ICD-10-CM | POA: Diagnosis not present

## 2022-10-17 ENCOUNTER — Other Ambulatory Visit (HOSPITAL_COMMUNITY): Payer: Self-pay | Admitting: Internal Medicine

## 2022-10-17 DIAGNOSIS — Z13820 Encounter for screening for osteoporosis: Secondary | ICD-10-CM

## 2022-10-17 DIAGNOSIS — E782 Mixed hyperlipidemia: Secondary | ICD-10-CM | POA: Diagnosis not present

## 2022-10-17 DIAGNOSIS — N3281 Overactive bladder: Secondary | ICD-10-CM | POA: Diagnosis not present

## 2022-10-17 DIAGNOSIS — G894 Chronic pain syndrome: Secondary | ICD-10-CM | POA: Diagnosis not present

## 2022-10-17 DIAGNOSIS — F411 Generalized anxiety disorder: Secondary | ICD-10-CM | POA: Diagnosis not present

## 2022-10-17 DIAGNOSIS — E1169 Type 2 diabetes mellitus with other specified complication: Secondary | ICD-10-CM | POA: Diagnosis not present

## 2022-10-17 DIAGNOSIS — F17218 Nicotine dependence, cigarettes, with other nicotine-induced disorders: Secondary | ICD-10-CM | POA: Diagnosis not present

## 2022-10-17 DIAGNOSIS — I10 Essential (primary) hypertension: Secondary | ICD-10-CM | POA: Diagnosis not present

## 2022-10-17 DIAGNOSIS — Z853 Personal history of malignant neoplasm of breast: Secondary | ICD-10-CM | POA: Diagnosis not present

## 2022-10-17 DIAGNOSIS — F331 Major depressive disorder, recurrent, moderate: Secondary | ICD-10-CM | POA: Diagnosis not present

## 2022-10-25 ENCOUNTER — Ambulatory Visit (HOSPITAL_COMMUNITY)
Admission: RE | Admit: 2022-10-25 | Discharge: 2022-10-25 | Disposition: A | Discharge status: Home / Self Care | Payer: Medicare HMO | Source: Ambulatory Visit | Attending: Internal Medicine | Admitting: Internal Medicine

## 2022-10-25 DIAGNOSIS — M85851 Other specified disorders of bone density and structure, right thigh: Secondary | ICD-10-CM | POA: Insufficient documentation

## 2022-10-25 DIAGNOSIS — Z780 Asymptomatic menopausal state: Secondary | ICD-10-CM | POA: Diagnosis not present

## 2022-10-25 DIAGNOSIS — F17200 Nicotine dependence, unspecified, uncomplicated: Secondary | ICD-10-CM | POA: Diagnosis not present

## 2022-10-25 DIAGNOSIS — Z13820 Encounter for screening for osteoporosis: Secondary | ICD-10-CM | POA: Insufficient documentation

## 2022-10-25 DIAGNOSIS — Z853 Personal history of malignant neoplasm of breast: Secondary | ICD-10-CM | POA: Insufficient documentation

## 2022-11-02 ENCOUNTER — Ambulatory Visit: Payer: Medicare HMO | Admitting: Orthopedic Surgery

## 2022-11-26 ENCOUNTER — Encounter: Payer: Self-pay | Admitting: Surgery

## 2022-11-26 ENCOUNTER — Ambulatory Visit: Payer: Medicare HMO | Admitting: Surgery

## 2022-11-26 ENCOUNTER — Ambulatory Visit (HOSPITAL_COMMUNITY)
Admission: RE | Admit: 2022-11-26 | Discharge: 2022-11-26 | Disposition: A | Discharge status: Home / Self Care | Payer: Medicare HMO | Source: Ambulatory Visit | Attending: Surgery | Admitting: Surgery

## 2022-11-26 VITALS — BP 162/83 | HR 67 | Temp 98.0°F | Resp 20 | Ht 62.0 in | Wt 170.0 lb

## 2022-11-26 DIAGNOSIS — I6529 Occlusion and stenosis of unspecified carotid artery: Secondary | ICD-10-CM

## 2022-11-26 DIAGNOSIS — I6523 Occlusion and stenosis of bilateral carotid arteries: Secondary | ICD-10-CM | POA: Diagnosis not present

## 2022-11-26 NOTE — Progress Notes (Signed)
[facility] [place]  Patient name: Ashley Doyle MRN: [id number] DOB: June 17, 1945 Sex: female   REASON FOR VISIT:    Follow up  HISOTRY OF PRESENT ILLNESS:    Ashley Doyle is a 77 y.o. female who has undergone the following procedures:   12/24/2017: Resection of left carotid body tumor and left carotid endarterectomy 05/09/2018: Right carotid endarterectomy  She remains asymptomatic.  She denies any numbness or weakness in either extremity.  She denies slurred speech.  She denies amaurosis fugax  The patient has been diagnosed with diabetes in the past, however no longer carries that diagnosis.  She suffers from COPD secondary to tobacco abuse.  She takes a statin for hypercholesterolemia and is medically managed for hypertension.   PAST MEDICAL HISTORY:   Past Medical History:  Diagnosis Date   Anxiety    Arthritis    Asthma    not on inhalers now- no flares recently   Breast cancer (HCC)    Chronic kidney disease    "as a child" "severe infection and it was toxic"   Constipation    COPD (chronic obstructive pulmonary disease) (HCC)    Depression    Dyspnea    with exertion   Emphysema of lung (HCC)    Family history of adverse reaction to anesthesia    sister has PONV   GERD (gastroesophageal reflux disease)    Headache    history of migraines- none since hysterectomy- has bad headaches at times   History of chemotherapy    HOH (hard of hearing)    Hypertension    Invasive ductal carcinoma of breast, female, right (HCC) 03/07/2016   Peripheral vascular disease (HCC)    Pinched nerve in neck    and back   Sciatica    Stroke (HCC)    2 - right sided weakness- right leg - "speech expressive aphasia when nervous"   Tuberculosis    "several years ago" 04/11/16   Wears glasses    Wears hearing aid    left ear     FAMILY HISTORY:   Family History  Problem Relation Age of Onset   Stroke Mother     Heart disease Mother    Lung cancer Father     SOCIAL HISTORY:   Social History   Tobacco Use   Smoking status: Every Day    Current packs/day: 1.50    Average packs/day: 1.5 packs/day for 55.0 years (82.5 ttl pk-yrs)    Types: Cigarettes   Smokeless tobacco: Never   Tobacco comments:    "30-32 cigarettes per day"  Substance Use Topics   Alcohol use: No     ALLERGIES:   No Known Allergies   CURRENT MEDICATIONS:   Current Outpatient Medications  Medication Sig Dispense Refill   albuterol (VENTOLIN HFA) 108 (90 Base) MCG/ACT inhaler Inhale into the lungs every 6 (six) hours as needed for wheezing or shortness of breath.     Artificial Tear Ointment (DRY EYES OP) Place 1 drop into both eyes daily as needed (for dry eyes).     aspirin EC 81 MG tablet Take 1 tablet (81 mg total) by mouth daily. 90 tablet 3   atorvastatin (LIPITOR) 40 MG tablet Take 1 tablet (40 mg total) by mouth daily. Pt needs to make appt with provider for additional refills - 2nd attempt 15 tablet 0   buPROPion (WELLBUTRIN SR) 150 MG 12 hr tablet Take 150 mg by mouth 2 (two) times daily.  citalopram (CELEXA) 40 MG tablet Take 40 mg by mouth daily.  4   docusate sodium (COLACE) 100 MG capsule Take 100 mg by mouth daily as needed for mild constipation.     gabapentin (NEURONTIN) 300 MG capsule Take 1 capsule (300 mg total) by mouth 3 (three) times daily. 90 capsule 2   lisinopril (ZESTRIL) 20 MG tablet Take 20 mg by mouth daily.     LORazepam (ATIVAN) 0.5 MG tablet Take 1 tablet (0.5 mg total) by mouth every 8 (eight) hours. (Patient taking differently: Take 0.5 mg by mouth every 8 (eight) hours as needed for anxiety.) 30 tablet 0   methocarbamol (ROBAXIN) 750 MG tablet Take 1 tablet (750 mg total) by mouth every 8 (eight) hours as needed (use for muscle cramps/pain). 20 tablet 0   metoprolol tartrate (LOPRESSOR) 25 MG tablet TAKE 1 TABLET BY MOUTH TWICE A DAY 7 tablet 0   oxyCODONE-acetaminophen  (PERCOCET) 10-325 MG tablet Take 1 tablet by mouth every 8 (eight) hours as needed for pain.     pantoprazole (PROTONIX) 40 MG tablet Take 40 mg by mouth daily.     TRELEGY ELLIPTA 100-62.5-25 MCG/ACT AEPB Inhale 1 puff into the lungs daily.     triamterene-hydrochlorothiazide (MAXZIDE-25) 37.5-25 MG tablet Take 1 tablet by mouth daily.  1   No current facility-administered medications for this visit.    REVIEW OF SYSTEMS:   [X]  denotes positive finding, [ ]  denotes negative finding Cardiac  Comments:  Chest pain or chest pressure:    Shortness of breath upon exertion:    Short of breath when lying flat:    Irregular heart rhythm:        Vascular    Pain in calf, thigh, or hip brought on by ambulation:    Pain in feet at night that wakes you up from your sleep:     Blood clot in your veins:    Leg swelling:         Pulmonary    Oxygen at home:    Productive cough:     Wheezing:         Neurologic    Sudden weakness in arms or legs:     Sudden numbness in arms or legs:     Sudden onset of difficulty speaking or slurred speech:    Temporary loss of vision in one eye:     Problems with dizziness:         Gastrointestinal    Blood in stool:     Vomited blood:         Genitourinary    Burning when urinating:     Blood in urine:        Psychiatric    Major depression:         Hematologic    Bleeding problems:    Problems with blood clotting too easily:        Skin    Rashes or ulcers:        Constitutional    Fever or chills:      PHYSICAL EXAM:   Vitals:   11/26/22 1457  BP: (!) 162/83  Pulse: 67  Resp: 20  Temp: 98 F (36.7 C)  SpO2: 93%  Weight: 170 lb (77.1 kg)  Height: 5\' 2"  (1.575 m)    GENERAL: The patient is a well-nourished female, in no acute distress. The vital signs are documented above. CARDIAC: There is a regular rate and rhythm.  VASCULAR: No carotid bruits  PULMONARY: Non-labored respirations MUSCULOSKELETAL: There are no major  deformities or cyanosis. NEUROLOGIC: No focal weakness or paresthesias are detected. SKIN: There are no ulcers or rashes noted. PSYCHIATRIC: The patient has a normal affect.  STUDIES:   I have reviewed the following carotid duplex: Right Carotid: Velocities in the right ICA are consistent with a 1-39%  stenosis.                The ECA appears >50% stenosed.   Left Carotid: There is no evidence of stenosis in the left ICA.   Vertebrals:  Bilateral vertebral arteries demonstrate antegrade flow.  Subclavians: Normal flow hemodynamics were seen in bilateral subclavian               arteries.    MEDICAL ISSUES:   Carotid: No significant stenosis identified.  I will have her back in 2 years for surveillance imaging    Durene Cal, IV, MD, FACS [facility] [phone] Pager [phone]

## 2022-12-13 ENCOUNTER — Ambulatory Visit: Payer: Medicare HMO | Admitting: Orthopedic Surgery

## 2023-01-24 ENCOUNTER — Other Ambulatory Visit (HOSPITAL_COMMUNITY): Payer: Self-pay | Admitting: Family Medicine

## 2023-01-24 DIAGNOSIS — E1169 Type 2 diabetes mellitus with other specified complication: Secondary | ICD-10-CM | POA: Diagnosis not present

## 2023-01-24 DIAGNOSIS — J00 Acute nasopharyngitis [common cold]: Secondary | ICD-10-CM | POA: Diagnosis not present

## 2023-01-24 DIAGNOSIS — F17200 Nicotine dependence, unspecified, uncomplicated: Secondary | ICD-10-CM | POA: Diagnosis not present

## 2023-01-24 DIAGNOSIS — R059 Cough, unspecified: Secondary | ICD-10-CM

## 2023-01-24 DIAGNOSIS — J441 Chronic obstructive pulmonary disease with (acute) exacerbation: Secondary | ICD-10-CM | POA: Diagnosis not present

## 2023-01-24 DIAGNOSIS — E782 Mixed hyperlipidemia: Secondary | ICD-10-CM | POA: Diagnosis not present

## 2023-01-25 ENCOUNTER — Ambulatory Visit (HOSPITAL_COMMUNITY)
Admission: RE | Admit: 2023-01-25 | Discharge: 2023-01-25 | Disposition: A | Discharge status: Home / Self Care | Payer: Medicare HMO | Source: Ambulatory Visit | Attending: Family Medicine | Admitting: Family Medicine

## 2023-01-25 DIAGNOSIS — R059 Cough, unspecified: Secondary | ICD-10-CM

## 2023-01-25 DIAGNOSIS — R058 Other specified cough: Secondary | ICD-10-CM | POA: Diagnosis not present

## 2023-02-01 DIAGNOSIS — Z23 Encounter for immunization: Secondary | ICD-10-CM | POA: Diagnosis not present

## 2023-02-01 DIAGNOSIS — F331 Major depressive disorder, recurrent, moderate: Secondary | ICD-10-CM | POA: Diagnosis not present

## 2023-02-01 DIAGNOSIS — I10 Essential (primary) hypertension: Secondary | ICD-10-CM | POA: Diagnosis not present

## 2023-02-01 DIAGNOSIS — E782 Mixed hyperlipidemia: Secondary | ICD-10-CM | POA: Diagnosis not present

## 2023-02-01 DIAGNOSIS — Z0001 Encounter for general adult medical examination with abnormal findings: Secondary | ICD-10-CM | POA: Diagnosis not present

## 2023-02-01 DIAGNOSIS — E1169 Type 2 diabetes mellitus with other specified complication: Secondary | ICD-10-CM | POA: Diagnosis not present

## 2023-02-01 DIAGNOSIS — E119 Type 2 diabetes mellitus without complications: Secondary | ICD-10-CM | POA: Diagnosis not present

## 2023-02-01 DIAGNOSIS — Z853 Personal history of malignant neoplasm of breast: Secondary | ICD-10-CM | POA: Diagnosis not present

## 2023-02-01 DIAGNOSIS — N3281 Overactive bladder: Secondary | ICD-10-CM | POA: Diagnosis not present

## 2023-04-01 DIAGNOSIS — H25811 Combined forms of age-related cataract, right eye: Secondary | ICD-10-CM | POA: Diagnosis not present

## 2023-04-01 DIAGNOSIS — H34211 Partial retinal artery occlusion, right eye: Secondary | ICD-10-CM | POA: Diagnosis not present

## 2023-04-01 DIAGNOSIS — D3131 Benign neoplasm of right choroid: Secondary | ICD-10-CM | POA: Diagnosis not present

## 2023-05-03 DIAGNOSIS — H25811 Combined forms of age-related cataract, right eye: Secondary | ICD-10-CM | POA: Diagnosis not present

## 2023-05-21 DIAGNOSIS — E559 Vitamin D deficiency, unspecified: Secondary | ICD-10-CM | POA: Diagnosis not present

## 2023-05-21 DIAGNOSIS — E1169 Type 2 diabetes mellitus with other specified complication: Secondary | ICD-10-CM | POA: Diagnosis not present

## 2023-05-21 DIAGNOSIS — M8580 Other specified disorders of bone density and structure, unspecified site: Secondary | ICD-10-CM | POA: Diagnosis not present

## 2023-05-21 DIAGNOSIS — E782 Mixed hyperlipidemia: Secondary | ICD-10-CM | POA: Diagnosis not present

## 2023-05-24 DIAGNOSIS — Z853 Personal history of malignant neoplasm of breast: Secondary | ICD-10-CM | POA: Diagnosis not present

## 2023-05-24 DIAGNOSIS — E782 Mixed hyperlipidemia: Secondary | ICD-10-CM | POA: Diagnosis not present

## 2023-05-24 DIAGNOSIS — F331 Major depressive disorder, recurrent, moderate: Secondary | ICD-10-CM | POA: Diagnosis not present

## 2023-05-24 DIAGNOSIS — I10 Essential (primary) hypertension: Secondary | ICD-10-CM | POA: Diagnosis not present

## 2023-05-24 DIAGNOSIS — N3281 Overactive bladder: Secondary | ICD-10-CM | POA: Diagnosis not present

## 2023-05-24 DIAGNOSIS — F411 Generalized anxiety disorder: Secondary | ICD-10-CM | POA: Diagnosis not present

## 2023-05-24 DIAGNOSIS — E1169 Type 2 diabetes mellitus with other specified complication: Secondary | ICD-10-CM | POA: Diagnosis not present

## 2023-05-24 DIAGNOSIS — F17210 Nicotine dependence, cigarettes, uncomplicated: Secondary | ICD-10-CM | POA: Diagnosis not present

## 2023-05-24 DIAGNOSIS — J441 Chronic obstructive pulmonary disease with (acute) exacerbation: Secondary | ICD-10-CM | POA: Diagnosis not present

## 2023-08-29 DIAGNOSIS — E1169 Type 2 diabetes mellitus with other specified complication: Secondary | ICD-10-CM | POA: Diagnosis not present

## 2023-08-29 DIAGNOSIS — I10 Essential (primary) hypertension: Secondary | ICD-10-CM | POA: Diagnosis not present

## 2023-09-04 DIAGNOSIS — H34211 Partial retinal artery occlusion, right eye: Secondary | ICD-10-CM | POA: Diagnosis not present

## 2023-09-04 DIAGNOSIS — Z961 Presence of intraocular lens: Secondary | ICD-10-CM | POA: Diagnosis not present

## 2023-09-04 DIAGNOSIS — D3131 Benign neoplasm of right choroid: Secondary | ICD-10-CM | POA: Diagnosis not present

## 2023-09-04 DIAGNOSIS — H25812 Combined forms of age-related cataract, left eye: Secondary | ICD-10-CM | POA: Diagnosis not present

## 2023-09-05 ENCOUNTER — Other Ambulatory Visit (HOSPITAL_COMMUNITY): Payer: Self-pay | Admitting: Nurse Practitioner

## 2023-09-05 DIAGNOSIS — N3281 Overactive bladder: Secondary | ICD-10-CM | POA: Diagnosis not present

## 2023-09-05 DIAGNOSIS — E1169 Type 2 diabetes mellitus with other specified complication: Secondary | ICD-10-CM | POA: Diagnosis not present

## 2023-09-05 DIAGNOSIS — F411 Generalized anxiety disorder: Secondary | ICD-10-CM | POA: Diagnosis not present

## 2023-09-05 DIAGNOSIS — I10 Essential (primary) hypertension: Secondary | ICD-10-CM | POA: Diagnosis not present

## 2023-09-05 DIAGNOSIS — E782 Mixed hyperlipidemia: Secondary | ICD-10-CM | POA: Diagnosis not present

## 2023-09-05 DIAGNOSIS — F17200 Nicotine dependence, unspecified, uncomplicated: Secondary | ICD-10-CM

## 2023-09-05 DIAGNOSIS — F331 Major depressive disorder, recurrent, moderate: Secondary | ICD-10-CM | POA: Diagnosis not present

## 2023-09-05 DIAGNOSIS — Z853 Personal history of malignant neoplasm of breast: Secondary | ICD-10-CM | POA: Diagnosis not present

## 2023-09-05 DIAGNOSIS — J441 Chronic obstructive pulmonary disease with (acute) exacerbation: Secondary | ICD-10-CM | POA: Diagnosis not present

## 2023-09-05 DIAGNOSIS — F17210 Nicotine dependence, cigarettes, uncomplicated: Secondary | ICD-10-CM | POA: Diagnosis not present

## 2023-09-17 ENCOUNTER — Ambulatory Visit (HOSPITAL_COMMUNITY)
Admission: RE | Admit: 2023-09-17 | Discharge: 2023-09-17 | Disposition: A | Discharge status: Home / Self Care | Source: Ambulatory Visit | Attending: Nurse Practitioner | Admitting: Nurse Practitioner

## 2023-09-17 DIAGNOSIS — I2510 Atherosclerotic heart disease of native coronary artery without angina pectoris: Secondary | ICD-10-CM | POA: Diagnosis not present

## 2023-09-17 DIAGNOSIS — N200 Calculus of kidney: Secondary | ICD-10-CM | POA: Diagnosis not present

## 2023-09-17 DIAGNOSIS — F17210 Nicotine dependence, cigarettes, uncomplicated: Secondary | ICD-10-CM | POA: Insufficient documentation

## 2023-09-17 DIAGNOSIS — M899 Disorder of bone, unspecified: Secondary | ICD-10-CM | POA: Insufficient documentation

## 2023-09-17 DIAGNOSIS — K449 Diaphragmatic hernia without obstruction or gangrene: Secondary | ICD-10-CM | POA: Insufficient documentation

## 2023-09-17 DIAGNOSIS — I700 Atherosclerosis of aorta: Secondary | ICD-10-CM | POA: Diagnosis not present

## 2023-09-17 DIAGNOSIS — Z122 Encounter for screening for malignant neoplasm of respiratory organs: Secondary | ICD-10-CM | POA: Diagnosis not present

## 2023-09-17 DIAGNOSIS — R918 Other nonspecific abnormal finding of lung field: Secondary | ICD-10-CM | POA: Insufficient documentation

## 2023-09-17 DIAGNOSIS — Z853 Personal history of malignant neoplasm of breast: Secondary | ICD-10-CM | POA: Insufficient documentation

## 2023-09-17 DIAGNOSIS — F17200 Nicotine dependence, unspecified, uncomplicated: Secondary | ICD-10-CM | POA: Insufficient documentation

## 2023-10-17 ENCOUNTER — Inpatient Hospital Stay: Admitting: Oncology

## 2023-10-17 ENCOUNTER — Inpatient Hospital Stay

## 2023-11-04 ENCOUNTER — Inpatient Hospital Stay

## 2023-11-04 ENCOUNTER — Inpatient Hospital Stay: Attending: Oncology | Admitting: Oncology

## 2023-11-04 VITALS — BP 125/69 | HR 67 | Temp 97.8°F | Resp 16 | Ht 62.0 in | Wt 155.4 lb

## 2023-11-04 DIAGNOSIS — R911 Solitary pulmonary nodule: Secondary | ICD-10-CM | POA: Diagnosis not present

## 2023-11-04 DIAGNOSIS — J4489 Other specified chronic obstructive pulmonary disease: Secondary | ICD-10-CM | POA: Insufficient documentation

## 2023-11-04 DIAGNOSIS — M899 Disorder of bone, unspecified: Secondary | ICD-10-CM | POA: Diagnosis not present

## 2023-11-04 DIAGNOSIS — Z87891 Personal history of nicotine dependence: Secondary | ICD-10-CM | POA: Diagnosis not present

## 2023-11-04 DIAGNOSIS — Z853 Personal history of malignant neoplasm of breast: Secondary | ICD-10-CM | POA: Diagnosis not present

## 2023-11-04 DIAGNOSIS — C50919 Malignant neoplasm of unspecified site of unspecified female breast: Secondary | ICD-10-CM

## 2023-11-04 DIAGNOSIS — R590 Localized enlarged lymph nodes: Secondary | ICD-10-CM | POA: Insufficient documentation

## 2023-11-04 DIAGNOSIS — Z9221 Personal history of antineoplastic chemotherapy: Secondary | ICD-10-CM | POA: Diagnosis not present

## 2023-11-04 DIAGNOSIS — R918 Other nonspecific abnormal finding of lung field: Secondary | ICD-10-CM | POA: Insufficient documentation

## 2023-11-04 DIAGNOSIS — C50911 Malignant neoplasm of unspecified site of right female breast: Secondary | ICD-10-CM

## 2023-11-04 LAB — COMPREHENSIVE METABOLIC PANEL WITH GFR
ALT: 10 U/L (ref 0–44)
AST: 17 U/L (ref 15–41)
Albumin: 4.1 g/dL (ref 3.5–5.0)
Alkaline Phosphatase: 89 U/L (ref 38–126)
Anion gap: 13 (ref 5–15)
BUN: 12 mg/dL (ref 8–23)
CO2: 28 mmol/L (ref 22–32)
Calcium: 9.5 mg/dL (ref 8.9–10.3)
Chloride: 93 mmol/L — ABNORMAL LOW (ref 98–111)
Creatinine, Ser: 0.94 mg/dL (ref 0.44–1.00)
GFR, Estimated: >60 mL/min (ref >60)
Glucose, Bld: 86 mg/dL (ref 70–99)
Potassium: 4.6 mmol/L (ref 3.5–5.1)
Sodium: 134 mmol/L — ABNORMAL LOW (ref 135–145)
Total Bilirubin: 0.6 mg/dL (ref 0.0–1.2)
Total Protein: 7.1 g/dL (ref 6.5–8.1)

## 2023-11-04 LAB — CBC WITH DIFFERENTIAL/PLATELET
Abs Immature Granulocytes: 0.03 10*3/uL (ref 0.00–0.07)
Basophils Absolute: 0.1 10*3/uL (ref 0.0–0.1)
Basophils Relative: 1 %
Eosinophils Absolute: 0.1 10*3/uL (ref 0.0–0.5)
Eosinophils Relative: 2 %
HCT: 44 % (ref 36.0–46.0)
Hemoglobin: 15.3 g/dL — ABNORMAL HIGH (ref 12.0–15.0)
Immature Granulocytes: 1 %
Lymphocytes Relative: 27 %
Lymphs Abs: 1.7 10*3/uL (ref 0.7–4.0)
MCH: 32.4 pg (ref 26.0–34.0)
MCHC: 34.8 g/dL (ref 30.0–36.0)
MCV: 93.2 fL (ref 80.0–100.0)
Monocytes Absolute: 0.7 10*3/uL (ref 0.1–1.0)
Monocytes Relative: 11 %
Neutro Abs: 3.7 10*3/uL (ref 1.7–7.7)
Neutrophils Relative %: 58 %
Platelets: 217 10*3/uL (ref 150–400)
RBC: 4.72 MIL/uL (ref 3.87–5.11)
RDW: 13.3 % (ref 11.5–15.5)
WBC: 6.3 10*3/uL (ref 4.0–10.5)
nRBC: 0 % (ref 0.0–0.2)

## 2023-11-04 NOTE — Progress Notes (Signed)
 Hematology-Oncology Clinic Note  Ashley Norleen PEDLAR, MD   Reason for Referral: Likely metastatic breast carcinoma  Oncology History: I have reviewed her chart and materials related to her cancer extensively and collaborated history with the patient. Summary of oncologic history is as follows:  Oncology History  Invasive ductal carcinoma of breast, female, right (HCC)  02/21/2016 Mammogram   Irregular vascular mass in the right breast at 10 o'clock, 10 cm the nipple, measuring 2.9 x 2.5 x 2.6 cm. In the right axilla there is 3 discrete enlarged abnormal appearing lymph nodes. Largest, which lies lateral to the palpable mass, measures 1.9 x 1.3 x 1.9 cm.   02/28/2016 Procedure   Right needle core biopsy in 10:00 position and right axillary biopsy   02/28/2016 Procedure   Postprocedure mammogram for clip placement. Ribbon shaped clip well-positioned within the biopsied mass in the right breast at the 10 o'clock axis. Wing shaped clip is well-positioned within the biopsied lymph node as seen on postprocedure ultrasound evaluation.   Final Assessment: Post Procedure Mammograms for Marker Placement   03/01/2016 Pathology Results   Invasive ductal carcinoma with extracellular mucin of right breast mass and ductal carcinoma of right axillary lymph node.  Grade 2.  ER 100%, PR 100%, Ki-67 12%, HER2 NEGATIVE.   03/12/2016 Imaging   CT CAP- Right breast cancer with associated right axillary metastatic lymph nodes. 2. No evidence of distant metastatic disease. 3. Hepatic steatosis.   03/26/2016 Breast MRI   2.7 cm spiculated mass in the upper-outer quadrant of right breast corresponding with the invasive ductal carcinoma. Masses in the right axilla consistent with axillary adenopathy.   04/16/2016 -  Chemotherapy   The patient had DOXOrubicin  (ADRIAMYCIN ) chemo injection 116 mg, 60 mg/m2 = 116 mg, Intravenous,  Once, 4 of 4 cycles Dose modification: 50 mg/m2 (original dose 60 mg/m2,  Cycle 4, Reason: Dose not tolerated)  palonosetron  (ALOXI ) injection 0.25 mg, 0.25 mg, Intravenous,  Once, 4 of 4 cycles  pegfilgrastim  (NEULASTA  ONPRO KIT) injection 6 mg, 6 mg, Subcutaneous, Once, 4 of 4 cycles  cyclophosphamide  (CYTOXAN ) 1,160 mg in sodium chloride  0.9 % 250 mL chemo infusion, 600 mg/m2 = 1,160 mg, Intravenous,  Once, 4 of 4 cycles Dose modification: 500 mg/m2 (original dose 600 mg/m2, Cycle 4, Reason: Dose not tolerated)  PACLitaxel  (TAXOL ) 156 mg in dextrose  5 % 250 mL chemo infusion ( fosaprepitant  (EMEND ) 150 mg, dexamethasone  (DECADRON ) 12 mg in sodium chloride  0.9 % 145 mL IVPB, , Intravenous,  Once, 4 of 4 cycles  for chemotherapy treatment.     05/28/2016 Adverse Reaction   Thrombocytopenia   05/28/2016 Treatment Plan Change   AC #4 delayed x 1 week   06/13/2016 Treatment Plan Change   Adriamycin  d/c'd for cycle #4 due to concerns for cardiomyopathy/fluid volume overload.    06/14/2016 Echocardiogram   EF 60-65%   10/12/2016 Breast MRI   1. Positive response to interval therapy with the dominant biopsy-proven carcinoma in the upper-outer quadrant of the right breast demonstrating a significant decrease in the overall volume of enhancement, although the extent of enhancement is not significantly changed compared to the previous examination at 3.6 x 2.5 x 2.8 cm. 2. Additional good response to interval therapy, as manifested by a significant decrease in size of the second biopsy-proven carcinoma (thought to be lymph node completely replaced by tumor) in the upper-outer quadrant of the right breast, located posterior to the dominant right breast mass, now measuring 1.5 cm greatest dimension versus  previous measurement of 2.7 cm. 3. Additional good response to interval therapy, as manifested by decreased size of the multiple enlarged lymph nodes seen in the right axilla on the previous exam. There are now no enlarged or morphologically abnormal lymph nodes  seen within either axilla or internal mammary chain region. 4. No evidence of malignancy within the left breast.   09/17/2023 Imaging   CT chest without contrast-low-dose for lung cancer screening:  IMPRESSION: 1. Lung-RADS 4B, suspicious. Additional imaging evaluation or consultation with Pulmonology or Thoracic Surgery recommended. Numerous (greater than 10) irregular solid pulmonary nodules scattered throughout both lungs, largest 24.4 mm in the left lower lobe and 14.6 mm in the basilar right upper lobe, all new from 03/09/2016 chest CT. Findings are most suspicious for multifocal pulmonary metastatic disease due to the patient's history of breast cancer. 2. Suggestion of mild left hilar adenopathy, poorly delineated on these noncontrast images, worrisome for metastatic adenopathy. 3. Several new sclerotic lesions throughout the thoracic vertebral bodies, the most discrete of which measures 0.7 cm in the posterosuperior T5 vertebral body, suspicious for osseous metastatic disease from breast primary.       History of Presenting Illness: Ashley Doyle 78 y.o. female is here for probable metastatic breast carcinoma. Patient is accompanied by her husband today.  She has a history of breast carcinoma diagnosed in 2018, s/p mastectomy and adjuvant chemotherapy.  Patient refused endocrine therapy due to potential side effects of hair loss and night sweats.  She recently had a low-dose CT chest scan done for her lung cancer screening and was found to have multiple bilateral lung nodules consistent with a probable metastatic disease. Patient has a history of COPD and significant lower back pain which she attributes to spinal arthritis.  She also has a T4 lesion in the CT scan.The back pain has persisted for several years, impacting her daily activities, with varying levels of functionality. She spends a significant portion of her day in a recliner or bed due to pain and uses muscle relaxers at night to  manage her symptoms, avoiding pain pills at night.  She experiences shortness of breath, particularly during activities such as bathing, and uses inhalers to manage her symptoms.  She has no other complaints today.  Denies fatigue, abdominal pain, nausea, weight loss, loss of appetite.   She smokes 2 packs of cigarettes per day and has not smoked for 60 years. She does not drink alcohol.  She had 2 brothers who died from cancers-1 with colon cancer metastatic to liver and another with bladder cancer.  She has 1 son and no daughters.  She lives with her husband.   Medical History: Past Medical History:  Diagnosis Date   Anxiety    Arthritis    Asthma    not on inhalers now- no flares recently   Breast cancer (HCC)    Chronic kidney disease    as a child severe infection and it was toxic   Constipation    COPD (chronic obstructive pulmonary disease) (HCC)    Depression    Dyspnea    with exertion   Emphysema of lung (HCC)    Family history of adverse reaction to anesthesia    sister has PONV   GERD (gastroesophageal reflux disease)    Headache    history of migraines- none since hysterectomy- has bad headaches at times   History of chemotherapy    HOH (hard of hearing)    Hypertension    Invasive ductal  carcinoma of breast, female, right (HCC) 03/07/2016   Peripheral vascular disease (HCC)    Pinched nerve in neck    and back   Sciatica    Stroke (HCC)    2 - right sided weakness- right leg - speech expressive aphasia when nervous   Tuberculosis    several years ago 04/11/16   Wears glasses    Wears hearing aid    left ear    Surgical history: Past Surgical History:  Procedure Laterality Date   ABDOMINAL HYSTERECTOMY     APPENDECTOMY     Arm surgery Right    injury- cut with glass   BREAST SURGERY     CAROTID BODY TUMOR EXCISION Left 01/03/2018   Procedure: TUMOR EXCISION CAROTID BODY LEFT;  Surgeon: Serene Gaile ORN, MD;  Location: [facility] OR;  Service:  Vascular;  Laterality: Left;   CAROTID ENDARTERECTOMY Right 05/09/2018   ENDARTERECTOMY Left 01/03/2018   Procedure: ENDARTERECTOMY CAROTID LEFT;  Surgeon: Serene Gaile ORN, MD;  Location: [facility] OR;  Service: Vascular;  Laterality: Left;   ENDARTERECTOMY Right 05/09/2018   Procedure: ENDARTERECTOMY CAROTID RIGHT;  Surgeon: Serene Gaile ORN, MD;  Location: [facility] OR;  Service: Vascular;  Laterality: Right;   INNER EAR SURGERY Right    IR ANGIO EXTERNAL CAROTID SEL EXT CAROTID UNI L MOD SED  01/02/2018   IR ANGIO INTRA EXTRACRAN SEL COM CAROTID INNOMINATE BILAT MOD SED  01/02/2018   IR ANGIO VERTEBRAL SEL SUBCLAVIAN INNOMINATE UNI R MOD SED  01/02/2018   IR ANGIOGRAM FOLLOW UP STUDY  01/02/2018   IR NEURO EACH ADD'L AFTER BASIC UNI LEFT (MS)  01/02/2018   IR TRANSCATH/EMBOLIZ  01/02/2018   MASTECTOMY WITH RADIOACTIVE SEED GUIDED EXCISION AND AXILLARY SENTINEL LYMPH NODE BIOPSY Right 11/22/2016   Procedure: REMOVAL OF RIGHT BREAST RADIOACTIVE SEED, RIGHT TOTAL MASTECTOMY WITH RADIOACTIVE SEED TARGETED RIGHT AXILLARY LYMPH NODE EXCISION AND AXILLARY SENTINEL LYMPH NODE BIOPSY, POSSIBLE RIGHT AXILLARY DISSECTION;  Surgeon: Ebbie Cough, MD;  Location: [facility] OR;  Service: General;  Laterality: Right;   PATCH ANGIOPLASTY Left 01/03/2018   Procedure: LEFT CAROTID ARTERY PATCH ANGIOPLASTY USING XENOSURE BIOLOGIC PATCH;  Surgeon: Serene Gaile ORN, MD;  Location: [facility] OR;  Service: Vascular;  Laterality: Left;   PORT-A-CATH REMOVAL Left 11/22/2016   Procedure: REMOVAL PORT-A-CATH;  Surgeon: Ebbie Cough, MD;  Location: [facility] OR;  Service: General;  Laterality: Left;   PORTACATH PLACEMENT N/A 04/12/2016   Procedure: INSERTION PORT-A-CATH WITH US ;  Surgeon: Cough Ebbie, MD;  Location: [facility] OR;  Service: General;  Laterality: N/A;   RADIOLOGY WITH ANESTHESIA N/A 01/02/2018   Procedure: EMBOLIZATION OF CAROTID BODY TUMOR;  Surgeon: Dolphus Carrion, MD;  Location: [facility] OR;  Service: Radiology;  Laterality: N/A;      Allergies:  has no known allergies.  Medications:  Current Outpatient Medications  Medication Sig Dispense Refill   albuterol  (VENTOLIN  HFA) 108 (90 Base) MCG/ACT inhaler Inhale into the lungs every 6 (six) hours as needed for wheezing or shortness of breath.     amLODipine (NORVASC) 5 MG tablet Take 5 mg by mouth daily.     Artificial Tear Ointment (DRY EYES OP) Place 1 drop into both eyes daily as needed (for dry eyes).     aspirin  EC 81 MG tablet Take 1 tablet (81 mg total) by mouth daily. 90 tablet 3   atorvastatin  (LIPITOR) 40 MG tablet Take 1 tablet (40 mg total) by mouth daily. Pt needs to make appt with provider for additional refills -  2nd attempt 15 tablet 0   BREZTRI AEROSPHERE 160-9-4.8 MCG/ACT AERO inhaler Inhale 2 puffs into the lungs 2 (two) times daily.     buPROPion  (WELLBUTRIN  SR) 150 MG 12 hr tablet Take 150 mg by mouth 2 (two) times daily.     citalopram  (CELEXA ) 40 MG tablet Take 40 mg by mouth daily.  4   docusate sodium  (COLACE) 100 MG capsule Take 100 mg by mouth daily as needed for mild constipation.     gabapentin  (NEURONTIN ) 300 MG capsule Take 1 capsule (300 mg total) by mouth 3 (three) times daily. 90 capsule 2   levocetirizine (XYZAL) 5 MG tablet Take 5 mg by mouth daily.     lisinopril (ZESTRIL) 20 MG tablet Take 20 mg by mouth daily.     LORazepam  (ATIVAN ) 0.5 MG tablet Take 1 tablet (0.5 mg total) by mouth every 8 (eight) hours. (Patient taking differently: Take 0.5 mg by mouth every 8 (eight) hours as needed for anxiety.) 30 tablet 0   methocarbamol  (ROBAXIN ) 750 MG tablet Take 1 tablet (750 mg total) by mouth every 8 (eight) hours as needed (use for muscle cramps/pain). 20 tablet 0   metoprolol  tartrate (LOPRESSOR ) 25 MG tablet TAKE 1 TABLET BY MOUTH TWICE A DAY 7 tablet 0   oxyCODONE -acetaminophen  (PERCOCET) 10-325 MG tablet Take 1 tablet by mouth every 8 (eight) hours as needed for pain.     pantoprazole  (PROTONIX ) 40 MG tablet Take 40 mg by mouth  daily.     TRELEGY ELLIPTA 100-62.5-25 MCG/ACT AEPB Inhale 1 puff into the lungs daily.     triamterene -hydrochlorothiazide  (MAXZIDE -25) 37.5-25 MG tablet Take 1 tablet by mouth daily.  1   Vitamin D, Ergocalciferol, (DRISDOL) 1.25 MG (50000 UNIT) CAPS capsule Take 50,000 Units by mouth once a week.     No current facility-administered medications for this visit.    Review of Systems: Constitutional: Denies fevers, chills or abnormal night sweats Eyes: Denies blurriness of vision, double vision or watery eyes Ears, nose, mouth, throat, and face: Denies mucositis or sore throat Respiratory: Denies cough, dyspnea or wheezes Cardiovascular: Denies palpitation, chest discomfort or lower extremity swelling Gastrointestinal:  Denies nausea, heartburn or change in bowel habits Skin: Denies abnormal skin rashes Lymphatics: Denies new lymphadenopathy or easy bruising Neurological:Denies numbness, tingling or new weaknesses Behavioral/Psych: Mood is stable, no new changes  All other systems were reviewed with the patient and are negative.  Physical Examination: ECOG PERFORMANCE STATUS: 2 - Symptomatic, <50% confined to bed  Vitals:   11/04/23 1114  BP: 125/69  Pulse: 67  Resp: 16  Temp: 97.8 F (36.6 C)  SpO2: 94%   Filed Weights   11/04/23 1114  Weight: 155 lb 6.8 oz (70.5 kg)    GENERAL:alert, no distress and comfortable SKIN: skin color, texture, turgor are normal, no rashes or significant lesions EYES: Strabismus bilateral eyes right greater than left BREAST: Right-sided mastectomy, no lumps palpated.  Left side tenderness on deep palpation around the nipple area, no lumps palpated, no discharge. LYMPH:  no palpable lymphadenopathy in the cervical, axillary or inguinal LUNGS: clear to auscultation and percussion with normal breathing effort HEART: regular rate & rhythm and no murmurs and no lower extremity edema ABDOMEN:abdomen soft, non-tender and normal bowel  sounds Musculoskeletal:no cyanosis of digits and no clubbing  PSYCH: alert & oriented x 3 with fluent speech  Laboratory Data: I have reviewed the data as listed Lab Results  Component Value Date   WBC 6.3 11/04/2023  HGB 15.3 (H) 11/04/2023   HCT 44.0 11/04/2023   MCV 93.2 11/04/2023   PLT 217 11/04/2023   Recent Labs    11/04/23 1146  NA 134*  K 4.6  CL 93*  CO2 28  GLUCOSE 86  BUN 12  CREATININE 0.94  CALCIUM  9.5  GFRNONAA >60  PROT 7.1  ALBUMIN 4.1  AST 17  ALT 10  ALKPHOS 89  BILITOT 0.6    Radiographic Studies: I have personally reviewed the radiological images as listed and agreed with the findings in the report.  CT CHEST LUNG CA SCREEN LOW DOSE W/O CM CLINICAL DATA:  78 year old female current smoker with 62 pack-year smoking history. History of right breast cancer. * Tracking Code: BO *  EXAM: CT CHEST WITHOUT CONTRAST LOW-DOSE FOR LUNG CANCER SCREENING  TECHNIQUE: Multidetector CT imaging of the chest was performed following the standard protocol without IV contrast.  RADIATION DOSE REDUCTION: This exam was performed according to the departmental dose-optimization program which includes automated exposure control, adjustment of the mA and/or kV according to patient size and/or use of iterative reconstruction technique.  COMPARISON:  01/25/2023 chest radiograph.  03/09/2016 chest CT.  FINDINGS: Cardiovascular: Normal heart size. No significant pericardial effusion/thickening. Three-vessel coronary atherosclerosis. Atherosclerotic nonaneurysmal thoracic aorta. Normal caliber pulmonary arteries.  Mediastinum/Nodes: No significant thyroid  nodules. Unremarkable esophagus. Surgical clips in the right axilla. No pathologically enlarged axillary nodes. No pathologically enlarged mediastinal nodes. Suggestion of mild left hilar adenopathy, poorly delineated on these noncontrast images. No discrete right hilar adenopathy.  Lungs/Pleura: No  pneumothorax. No pleural effusion. Moderate centrilobular emphysema with diffuse bronchial wall thickening. No acute consolidative airspace disease or lung masses. Numerous (greater than 10) irregular solid pulmonary nodules scattered throughout both lungs, largest 24.4 mm in the left lower lobe on series 4/image 199 and 14.6 mm in the basilar right upper lobe on series 4/image 140, all new from 03/09/2016 chest CT.  Upper abdomen: Small hiatal hernia. Nonobstructing 7 mm upper right renal stone.  Musculoskeletal: Several new sclerotic lesions throughout the thoracic vertebral bodies, the most discrete of which measures 0.7 cm in the posterosuperior T5 vertebral body on series 7/image 168. Moderate thoracic spondylosis. Right mastectomy.  IMPRESSION: 1. Lung-RADS 4B, suspicious. Additional imaging evaluation or consultation with Pulmonology or Thoracic Surgery recommended. Numerous (greater than 10) irregular solid pulmonary nodules scattered throughout both lungs, largest 24.4 mm in the left lower lobe and 14.6 mm in the basilar right upper lobe, all new from 03/09/2016 chest CT. Findings are most suspicious for multifocal pulmonary metastatic disease due to the patient's history of breast cancer. 2. Suggestion of mild left hilar adenopathy, poorly delineated on these noncontrast images, worrisome for metastatic adenopathy. 3. Several new sclerotic lesions throughout the thoracic vertebral bodies, the most discrete of which measures 0.7 cm in the posterosuperior T5 vertebral body, suspicious for osseous metastatic disease from breast primary. 4. Three-vessel coronary atherosclerosis. 5. Small hiatal hernia. 6. Nonobstructing right nephrolithiasis. 7. Aortic Atherosclerosis (ICD10-I70.0) and Emphysema (ICD10-J43.9).  These results will be called to the ordering clinician or representative by the Radiologist Assistant, and communication documented in the PACS or [employer].  Electronically Signed   By: Selinda DELENA Blue M.D.   On: 10/11/2023 12:20    ASSESSMENT & PLAN:  Patient is a 78 y.o. female presenting for likely metastatic breast cancer  Breast cancer, right (HCC) Patient had a history of ER/PR positive, HER2 negative breast carcinoma treated with mastectomy s/p adjuvant chemotherapy with dose dense AC x  12 cycles completed on 09/16/2016 Patient refused adjuvant endocrine therapy Recent CT scan with multiple lung metastasis and T4 bone metastasis  - Will obtain a PET scan to assess for the extent of disease and an accessible site for biopsy - Patient has a history of severe COPD, and lung biopsy would be risky for her. - Patient stated that she would not take any more chemotherapy and might consider if there is a pill option. - Discussed extensively with the patient that a biopsy is essential to the diagnosis and treatment options would be based on the results.  Patient stated that she might consider biopsy based on the PET scan results - Will obtain breast cancer markers today  Return to clinic after PET scan to discuss results and further steps   Orders Placed This Encounter  Procedures   NM PET Image Initial (PI) Skull Base To Thigh    Standing Status:   Future    Expected Date:   11/04/2023    Expiration Date:   11/03/2024    If indicated for the ordered procedure, I authorize the administration of a radiopharmaceutical per Radiology protocol:   Yes    Preferred imaging location?:   [facility]   CBC with Differential/Platelet    Standing Status:   Future    Number of Occurrences:   1    Expected Date:   11/04/2023    Expiration Date:   02/02/2024   Comprehensive metabolic panel with GFR    Standing Status:   Future    Number of Occurrences:   1    Expected Date:   11/04/2023    Expiration Date:   02/02/2024   Cancer antigen 15-3    Standing Status:   Future    Number of Occurrences:   1    Expected Date:   11/04/2023    Expiration  Date:   02/02/2024   Cancer antigen 27.29    Standing Status:   Future    Number of Occurrences:   1    Expected Date:   11/04/2023    Expiration Date:   02/02/2024    The total time spent in the appointment was 60 minutes encounter with patients including review of chart and various tests results, discussions about plan of care and coordination of care plan   All questions were answered. The patient knows to call the clinic with any problems, questions or concerns. No barriers to learning was detected.  Mickiel Dry, MD 6/23/20251:02 PM

## 2023-11-04 NOTE — Assessment & Plan Note (Addendum)
 Patient had a history of ER/PR positive, HER2 negative breast carcinoma treated with mastectomy s/p adjuvant chemotherapy with dose dense AC x 12 cycles completed on 09/16/2016 Patient refused adjuvant endocrine therapy Recent CT scan with multiple lung metastasis and T4 bone metastasis  - Will obtain a PET scan to assess for the extent of disease and an accessible site for biopsy - Patient has a history of severe COPD, and lung biopsy would be risky for her. - Patient stated that she would not take any more chemotherapy and might consider if there is a pill option. - Discussed extensively with the patient that a biopsy is essential to the diagnosis and treatment options would be based on the results.  Patient stated that she might consider biopsy based on the PET scan results - Will obtain breast cancer markers today  Return to clinic after PET scan to discuss results and further steps

## 2023-11-05 LAB — CANCER ANTIGEN 15-3: CA 15-3: 60.9 U/mL — ABNORMAL HIGH (ref 0.0–25.0)

## 2023-11-05 LAB — CANCER ANTIGEN 27.29: CA 27.29: 87.1 U/mL — ABNORMAL HIGH (ref 0.0–38.6)

## 2023-11-07 ENCOUNTER — Other Ambulatory Visit (HOSPITAL_COMMUNITY)

## 2023-11-14 ENCOUNTER — Encounter (HOSPITAL_COMMUNITY)
Admission: RE | Admit: 2023-11-14 | Discharge: 2023-11-14 | Disposition: A | Discharge status: Home / Self Care | Source: Ambulatory Visit | Attending: Oncology | Admitting: Oncology

## 2023-11-14 DIAGNOSIS — R918 Other nonspecific abnormal finding of lung field: Secondary | ICD-10-CM | POA: Diagnosis not present

## 2023-11-14 DIAGNOSIS — C50919 Malignant neoplasm of unspecified site of unspecified female breast: Secondary | ICD-10-CM | POA: Insufficient documentation

## 2023-11-14 MED ORDER — FLUDEOXYGLUCOSE F - 18 (FDG) INJECTION
8.1400 | Freq: Once | INTRAVENOUS | Status: AC | PRN
  Administered 2023-11-14: 8.14 via INTRAVENOUS

## 2023-11-19 ENCOUNTER — Ambulatory Visit: Admitting: Oncology

## 2023-11-20 ENCOUNTER — Inpatient Hospital Stay: Attending: Oncology | Admitting: Oncology

## 2023-11-20 VITALS — BP 112/54 | HR 65 | Temp 98.3°F | Resp 17 | Ht 62.0 in | Wt 154.0 lb

## 2023-11-20 DIAGNOSIS — Z9011 Acquired absence of right breast and nipple: Secondary | ICD-10-CM | POA: Diagnosis not present

## 2023-11-20 DIAGNOSIS — C50911 Malignant neoplasm of unspecified site of right female breast: Secondary | ICD-10-CM | POA: Diagnosis not present

## 2023-11-20 DIAGNOSIS — Z170 Estrogen receptor positive status [ER+]: Secondary | ICD-10-CM | POA: Diagnosis not present

## 2023-11-20 DIAGNOSIS — C7951 Secondary malignant neoplasm of bone: Secondary | ICD-10-CM | POA: Insufficient documentation

## 2023-11-20 DIAGNOSIS — C7800 Secondary malignant neoplasm of unspecified lung: Secondary | ICD-10-CM | POA: Diagnosis not present

## 2023-11-20 DIAGNOSIS — J449 Chronic obstructive pulmonary disease, unspecified: Secondary | ICD-10-CM | POA: Insufficient documentation

## 2023-11-20 DIAGNOSIS — Z853 Personal history of malignant neoplasm of breast: Secondary | ICD-10-CM | POA: Diagnosis not present

## 2023-11-20 NOTE — Assessment & Plan Note (Signed)
 Patient had a history of ER/PR positive, HER2 negative breast carcinoma treated with mastectomy s/p adjuvant chemotherapy with dose dense AC x 12 cycles completed on 09/16/2016 Patient refused adjuvant endocrine therapy Recent CT scan with multiple lung metastasis and T4 bone metastasis PET scan consistent with diffuse metastatic disease Breast cancer markers: Elevated  - Patient has a history of severe COPD, and lung biopsy would be risky for her. - Discussed extensively with the patient that a biopsy is essential for diagnosis and treatment options would be based on the results.  - Patient refused further workup at this time and further treatment including chemotherapy or pill options.  Discussed risk versus benefits in detail, including death if not treated.  Patient acknowledged risks of disease progression and mortality, opting for divine will over medical intervention. - Will refer to palliative care for hospice in future.  No further oncology needs at this time.  Recommended patient to reach out to us  if she changes her mind or prefers treatment in future.

## 2023-11-20 NOTE — Progress Notes (Signed)
 Patient Care Team: Shona Norleen PEDLAR, MD as PCP - General (Internal Medicine) Serene Gaile ORN, MD as Consulting Physician (Vascular Surgery)  Clinic Day:  11/20/2023  Referring physician: Shona Norleen PEDLAR, MD   CHIEF COMPLAINT:  CC: Metastatic breast carcinoma   ASSESSMENT & PLAN:   Assessment & Plan: Ashley Doyle  is a 78 y.o. female with metastatic breast carcinoma  Breast cancer, right [facility]) Patient had a history of ER/PR positive, HER2 negative breast carcinoma treated with mastectomy s/p adjuvant chemotherapy with dose dense AC x 12 cycles completed on 09/16/2016 Patient refused adjuvant endocrine therapy Recent CT scan with multiple lung metastasis and T4 bone metastasis PET scan consistent with diffuse metastatic disease Breast cancer markers: Elevated  - Patient has a history of severe COPD, and lung biopsy would be risky for her. - Discussed extensively with the patient that a biopsy is essential for diagnosis and treatment options would be based on the results.  - Patient refused further workup at this time and further treatment including chemotherapy or pill options.  Discussed risk versus benefits in detail, including death if not treated.  Patient acknowledged risks of disease progression and mortality, opting for divine will over medical intervention. - Will refer to palliative care for hospice in future.  No further oncology needs at this time.  Recommended patient to reach out to us  if she changes her mind or prefers treatment in future.    The patient understands the plans discussed today and is in agreement with them.  She knows to contact our office if she develops concerns prior to her next appointment.  I provided 30 minutes of face-to-face time during this encounter and > 50% was spent counseling as documented under my assessment and plan.    Mickiel Dry, MD  [facility] - A DEPT OF [facility] [address] [place] [address] Dept: [phone] Dept Fax: [phone]   No orders of the defined types were placed in this encounter.    ONCOLOGY HISTORY:   Oncology History  Invasive ductal carcinoma of breast, female, right (HCC)  02/21/2016 Mammogram   Irregular vascular mass in the right breast at 10 o'clock, 10 cm the nipple, measuring 2.9 x 2.5 x 2.6 cm. In the right axilla there is 3 discrete enlarged abnormal appearing lymph nodes. Largest, which lies lateral to the palpable mass, measures 1.9 x 1.3 x 1.9 cm.   02/28/2016 Procedure   Right needle core biopsy in 10:00 position and right axillary biopsy   02/28/2016 Procedure   Postprocedure mammogram for clip placement. Ribbon shaped clip well-positioned within the biopsied mass in the right breast at the 10 o'clock axis. Wing shaped clip is well-positioned within the biopsied lymph node as seen on postprocedure ultrasound evaluation.   Final Assessment: Post Procedure Mammograms for Marker Placement   03/01/2016 Pathology Results   Invasive ductal carcinoma with extracellular mucin of right breast mass and ductal carcinoma of right axillary lymph node.  Grade 2.  ER 100%, PR 100%, Ki-67 12%, HER2 NEGATIVE.   03/12/2016 Imaging   CT CAP- Right breast cancer with associated right axillary metastatic lymph nodes. 2. No evidence of distant metastatic disease. 3. Hepatic steatosis.   03/26/2016 Breast MRI   2.7 cm spiculated mass in the upper-outer quadrant of right breast corresponding with the invasive ductal carcinoma. Masses in the right axilla consistent with axillary adenopathy.   04/16/2016 -  Chemotherapy  The patient had DOXOrubicin  (ADRIAMYCIN ) chemo injection 116 mg, 60 mg/m2 = 116 mg, Intravenous,  Once, 4 of 4 cycles Dose modification: 50 mg/m2 (original dose 60 mg/m2, Cycle 4, Reason: Dose not tolerated)  palonosetron  (ALOXI ) injection 0.25 mg, 0.25 mg, Intravenous,  Once, 4 of 4  cycles  pegfilgrastim  (NEULASTA  ONPRO KIT) injection 6 mg, 6 mg, Subcutaneous, Once, 4 of 4 cycles  cyclophosphamide  (CYTOXAN ) 1,160 mg in sodium chloride  0.9 % 250 mL chemo infusion, 600 mg/m2 = 1,160 mg, Intravenous,  Once, 4 of 4 cycles Dose modification: 500 mg/m2 (original dose 600 mg/m2, Cycle 4, Reason: Dose not tolerated)  PACLitaxel  (TAXOL ) 156 mg in dextrose  5 % 250 mL chemo infusion ( fosaprepitant  (EMEND ) 150 mg, dexamethasone  (DECADRON ) 12 mg in sodium chloride  0.9 % 145 mL IVPB, , Intravenous,  Once, 4 of 4 cycles  for chemotherapy treatment.     05/28/2016 Adverse Reaction   Thrombocytopenia   05/28/2016 Treatment Plan Change   AC #4 delayed x 1 week   06/13/2016 Treatment Plan Change   Adriamycin  d/c'd for cycle #4 due to concerns for cardiomyopathy/fluid volume overload.    06/14/2016 Echocardiogram   EF 60-65%   10/12/2016 Breast MRI   1. Positive response to interval therapy with the dominant biopsy-proven carcinoma in the upper-outer quadrant of the right breast demonstrating a significant decrease in the overall volume of enhancement, although the extent of enhancement is not significantly changed compared to the previous examination at 3.6 x 2.5 x 2.8 cm. 2. Additional good response to interval therapy, as manifested by a significant decrease in size of the second biopsy-proven carcinoma (thought to be lymph node completely replaced by tumor) in the upper-outer quadrant of the right breast, located posterior to the dominant right breast mass, now measuring 1.5 cm greatest dimension versus previous measurement of 2.7 cm. 3. Additional good response to interval therapy, as manifested by decreased size of the multiple enlarged lymph nodes seen in the right axilla on the previous exam. There are now no enlarged or morphologically abnormal lymph nodes seen within either axilla or internal mammary chain region. 4. No evidence of malignancy within the left breast.    09/17/2023 Imaging   CT chest without contrast-low-dose for lung cancer screening:  IMPRESSION: 1. Lung-RADS 4B, suspicious. Additional imaging evaluation or consultation with Pulmonology or Thoracic Surgery recommended. Numerous (greater than 10) irregular solid pulmonary nodules scattered throughout both lungs, largest 24.4 mm in the left lower lobe and 14.6 mm in the basilar right upper lobe, all new from 03/09/2016 chest CT. Findings are most suspicious for multifocal pulmonary metastatic disease due to the patient's history of breast cancer. 2. Suggestion of mild left hilar adenopathy, poorly delineated on these noncontrast images, worrisome for metastatic adenopathy. 3. Several new sclerotic lesions throughout the thoracic vertebral bodies, the most discrete of which measures 0.7 cm in the posterosuperior T5 vertebral body, suspicious for osseous metastatic disease from breast primary.   11/04/2023 Tumor Marker   CA 15-3: 60.9 CA 27.29: 87.1   11/14/2023 PET scan   IMPRESSION: Multifocal sclerotic mildly hypermetabolic lesions worrisome for bone metastases.   Mild uptake along prominent nodes in the mediastinum and hilum. With this low level uptake these have a differential including infectious, inflammatory and metastatic.   The bilateral nodular opacities of the lung are again seen and have similar distribution to prior examination.    Soft tissue thickening and distortion along the right axillary region and upper chest wall with some low-level uptake.  Previous right mastectomy.   Breast cancer, right (HCC)  11/22/2016 Initial Diagnosis   Breast cancer, right (HCC)   11/04/2023 Tumor Marker   CA 15-3: 60.9 CA 27.29: 87.1   11/14/2023 PET scan   IMPRESSION: Multifocal sclerotic mildly hypermetabolic lesions worrisome for bone metastases.   Mild uptake along prominent nodes in the mediastinum and hilum. With this low level uptake these have a differential including infectious,  inflammatory and metastatic.   The bilateral nodular opacities of the lung are again seen and have similar distribution to prior examination.    Soft tissue thickening and distortion along the right axillary region and upper chest wall with some low-level uptake. Previous right mastectomy.   11/20/2023 Cancer Staging   Staging form: Breast, AJCC 8th Edition - Clinical stage from 11/20/2023: Stage IV (rcTX, rcNX, rcM1, GX, ER: Unknown, PR: Unknown, HER2: Unknown) - Signed by Davonna Siad, MD on 11/20/2023 Stage prefix: Recurrence Histologic grading system: 3 grade system       Current Treatment: Opted out of treatment  INTERVAL HISTORY:  Ashley Doyle is here today for follow up. Patient is accompanied by her husband and son today.  She experiences persistent fatigue and diffuse bone pain, particularly in her ribs and hip bones, which is significant and intolerable, affecting her daily activities.  We discussed the PET scan results in detail today confirming metastatic disease.  We went over the risks and benefits of doing a biopsy for confirmation of diagnosis and possible treatment options. She has declined endocrine therapy due to concerns about side effects such as hot flashes, bone pain, and symptoms similar to menopause. She also experiences 'bad head sweats' and thinning hair.  Her history of COPD has influenced the decision to avoid certain diagnostic procedures like a lung biopsy due to potential risks.  She has expressed a strong preference against chemotherapy or any further invasive treatments, including biopsies, due to the associated pain and her desire to avoid additional medications.  Patient wishes to not proceed with further biopsy or treatment and wishes to proceed with palliative care she was in quality of life at this time.   I have reviewed the past medical history, past surgical history, social history and family history with the patient and they are unchanged from  previous note.  ALLERGIES:  has no known allergies.  MEDICATIONS:  Current Outpatient Medications  Medication Sig Dispense Refill   albuterol  (VENTOLIN  HFA) 108 (90 Base) MCG/ACT inhaler Inhale into the lungs every 6 (six) hours as needed for wheezing or shortness of breath.     amLODipine (NORVASC) 5 MG tablet Take 5 mg by mouth daily.     Artificial Tear Ointment (DRY EYES OP) Place 1 drop into both eyes daily as needed (for dry eyes).     aspirin  EC 81 MG tablet Take 1 tablet (81 mg total) by mouth daily. 90 tablet 3   atorvastatin  (LIPITOR) 40 MG tablet Take 1 tablet (40 mg total) by mouth daily. Pt needs to make appt with provider for additional refills - 2nd attempt 15 tablet 0   BREZTRI AEROSPHERE 160-9-4.8 MCG/ACT AERO inhaler Inhale 2 puffs into the lungs 2 (two) times daily.     buPROPion  (WELLBUTRIN  SR) 150 MG 12 hr tablet Take 150 mg by mouth 2 (two) times daily.     citalopram  (CELEXA ) 40 MG tablet Take 40 mg by mouth daily.  4   docusate sodium  (COLACE) 100 MG capsule Take 100 mg by mouth daily as needed  for mild constipation.     gabapentin  (NEURONTIN ) 300 MG capsule Take 1 capsule (300 mg total) by mouth 3 (three) times daily. 90 capsule 2   levocetirizine (XYZAL) 5 MG tablet Take 5 mg by mouth daily.     lisinopril (ZESTRIL) 20 MG tablet Take 20 mg by mouth daily.     methocarbamol  (ROBAXIN ) 750 MG tablet Take 1 tablet (750 mg total) by mouth every 8 (eight) hours as needed (use for muscle cramps/pain). 20 tablet 0   metoprolol  tartrate (LOPRESSOR ) 25 MG tablet TAKE 1 TABLET BY MOUTH TWICE A DAY 7 tablet 0   oxyCODONE -acetaminophen  (PERCOCET) 10-325 MG tablet Take 1 tablet by mouth every 8 (eight) hours as needed for pain.     pantoprazole  (PROTONIX ) 40 MG tablet Take 40 mg by mouth daily.     triamterene -hydrochlorothiazide  (MAXZIDE -25) 37.5-25 MG tablet Take 1 tablet by mouth daily.  1   Vitamin D, Ergocalciferol, (DRISDOL) 1.25 MG (50000 UNIT) CAPS capsule Take 50,000  Units by mouth once a week.     No current facility-administered medications for this visit.       REVIEW OF SYSTEMS:   Constitutional: Denies fevers, chills or abnormal weight loss Eyes: Denies blurriness of vision Ears, nose, mouth, throat, and face: Denies mucositis or sore throat Respiratory: Denies cough, dyspnea or wheezes Cardiovascular: Denies palpitation, chest discomfort or lower extremity swelling Gastrointestinal:  Denies nausea, heartburn or change in bowel habits Skin: Denies abnormal skin rashes Lymphatics: Denies new lymphadenopathy or easy bruising Neurological:Denies numbness, tingling or new weaknesses Behavioral/Psych: Mood is stable, no new changes  All other systems were reviewed with the patient and are negative.   VITALS:  Blood pressure (!) 112/54, pulse 65, temperature 98.3 F (36.8 C), temperature source Tympanic, resp. rate 17, height 5' 2 (1.575 m), weight 154 lb (69.9 kg), SpO2 95%.  Wt Readings from Last 3 Encounters:  11/20/23 154 lb (69.9 kg)  11/04/23 155 lb 6.8 oz (70.5 kg)  11/26/22 170 lb (77.1 kg)    Body mass index is 28.17 kg/m.  Performance status (ECOG): 2 - Symptomatic, <50% confined to bed  PHYSICAL EXAM:   GENERAL:alert, no distress and comfortable SKIN: skin color, texture, turgor are normal, no rashes or significant lesions LUNGS: clear to auscultation and percussion with normal breathing effort HEART: regular rate & rhythm and no murmurs and no lower extremity edema ABDOMEN:abdomen soft, non-tender and normal bowel sounds Musculoskeletal:no cyanosis of digits and no clubbing  NEURO: alert & oriented x 3 with fluent speech, no focal motor/sensory deficits  LABORATORY DATA:  I have reviewed the data as listed    Component Value Date/Time   NA 134 (L) 11/04/2023 1146   K 4.6 11/04/2023 1146   CL 93 (L) 11/04/2023 1146   CO2 28 11/04/2023 1146   GLUCOSE 86 11/04/2023 1146   BUN 12 11/04/2023 1146   CREATININE 0.94  11/04/2023 1146   CALCIUM  9.5 11/04/2023 1146   PROT 7.1 11/04/2023 1146   ALBUMIN 4.1 11/04/2023 1146   AST 17 11/04/2023 1146   ALT 10 11/04/2023 1146   ALKPHOS 89 11/04/2023 1146   BILITOT 0.6 11/04/2023 1146   GFRNONAA >60 11/04/2023 1146   GFRAA 43 (L) 05/10/2018 0246    Lab Results  Component Value Date   WBC 6.3 11/04/2023   NEUTROABS 3.7 11/04/2023   HGB 15.3 (H) 11/04/2023   HCT 44.0 11/04/2023   MCV 93.2 11/04/2023   PLT 217 11/04/2023  Chemistry      Component Value Date/Time   NA 134 (L) 11/04/2023 1146   K 4.6 11/04/2023 1146   CL 93 (L) 11/04/2023 1146   CO2 28 11/04/2023 1146   BUN 12 11/04/2023 1146   CREATININE 0.94 11/04/2023 1146      Component Value Date/Time   CALCIUM  9.5 11/04/2023 1146   ALKPHOS 89 11/04/2023 1146   AST 17 11/04/2023 1146   ALT 10 11/04/2023 1146   BILITOT 0.6 11/04/2023 1146      Latest Reference Range & Units 11/04/23 11:46  CA 15-3 0.0 - 25.0 U/mL 60.9 (H)  CA 27.29 0.0 - 38.6 U/mL 87.1 (H)  (H): Data is abnormally high  RADIOGRAPHIC STUDIES: I have personally reviewed the radiological images as listed and agreed with the findings in the report.  NM PET Image Initial (PI) Skull Base To Thigh Result Date: 11/15/2023 CLINICAL DATA:  Initial treatment strategy for breast cancer. EXAM: NUCLEAR MEDICINE PET SKULL BASE TO THIGH TECHNIQUE: 8.14 mCi F-18 FDG was injected intravenously. Full-ring PET imaging was performed from the skull base to thigh after the radiotracer. CT data was obtained and used for attenuation correction and anatomic localization. Fasting blood glucose: 101 mg/dl COMPARISON:  Lung cancer screening CT chest 09/17/2023. FINDINGS: Mediastinal blood pool activity: SUV max 2.3 Liver activity: SUV max 3.1 NECK: No specific abnormal uptake identified in the neck including along lymph node change of the submandibular, posterior triangle or internal jugular region. Near symmetric uptake of the intracranial  compartment. Incidental CT findings: The parotid glands, submandibular glands and thyroid  glands unremarkable. Scattered vascular calcifications. Paranasal sinuses and mastoid air cells are clear. CHEST: On the prior CT scan there is some nodular infiltrative areas of nodular opacity seen in both lungs. These areas are again identified. Example left lower lobe focus which on the prior measured up to 24 mm. Today this is seen on image 88 of series 202. This focus has slight uptake with maximum SUV of 3.3. Example inferior anterior right upper lobe is similar today on image 76 the CT scan and has uptake of maximum SUV 2.1. Few other areas are stable as well and do not show significant uptake. There is also bilateral prominent hilar uptake. Example on the right has maximum SUV of 5.2 and left 4.0. Additional mild uptake along small mediastinal nodes including paratracheal and AP window. There is also some mild uptake along the soft tissue thickening in the right axillary region which could be postsurgical. Maximum SUV of 4.0. There is distortion along the right axillary region with evidence of prior right mastectomy as well. There is also some nodular ill-defined tissue along the right subclavian region on image 59 measuring 3.9 x 2.3 cm. This has some low-level uptake of 3.4 SUV maximum. Incidental CT findings: Please correlate with recent CT scan. Heart is nonenlarged. Scattered vascular calcifications including along the coronary arteries. Thoracic aorta is normal course and caliber with scattered calcified plaque. Small hiatal hernia. Breathing motion. No pleural effusion or pneumothorax. ABDOMEN/PELVIS: Physiologic distribution radiotracer along the parenchymal organs, bowel and renal collecting systems. No areas of abnormal nodal uptake. Incidental CT findings: On this noncontrast, attenuation correction CT, the liver, spleen, adrenal glands unremarkable. Atrophy of the pancreas. Gallbladder is present. Extensive  vascular calcifications along the aorta and branch vessels with areas of possible significant stenosis. Please correlate for specific symptoms. Calcifications along both kidneys very well could be vascular as well. No definite ureteral stones. Preserved contour to the  urinary bladder. Large bowel has a normal course and caliber with scattered colonic stool. Stomach and small bowel are nondilated. SKELETON: Multifocal areas of nodular osseous abnormal uptake identified with associated areas of bony sclerosis consistent with osseous metastatic disease. Distribution includes scattered along the pelvis, proximal right femur, sacrum, cervical, thoracic and lumbar spine, bilateral ribs, posterior aspect of the right tenth rib 4.1. Manubrium of the sternum. Examples include specifically left iliac bone with area of uptake of maximum SUV of 4.9 on image 137. Right pedicle of L4 vertebral body maximum SUV of 5.3. Incidental CT findings: Diffuse degenerative changes. IMPRESSION: Multifocal sclerotic mildly hypermetabolic lesions worrisome for bone metastases. Mild uptake along prominent nodes in the mediastinum and hilum. With this low level uptake these have a differential including infectious, inflammatory and metastatic. The bilateral nodular opacities of the lung are again seen and have similar distribution to prior examination. These have minimal uptake overall. Again there is a differential. Please correlate with any additional prior examinations older than May 2025. sampling could be considered if there is no known history. Soft tissue thickening and distortion along the right axillary region and upper chest wall with some low-level uptake. Previous right mastectomy. Extensive atherosclerotic calcifications. Electronically Signed   By: Ranell Bring M.D.   On: 11/15/2023 14:52

## 2023-12-13 DIAGNOSIS — I10 Essential (primary) hypertension: Secondary | ICD-10-CM | POA: Diagnosis not present

## 2023-12-13 DIAGNOSIS — R7303 Prediabetes: Secondary | ICD-10-CM | POA: Diagnosis not present

## 2023-12-16 DIAGNOSIS — E1169 Type 2 diabetes mellitus with other specified complication: Secondary | ICD-10-CM | POA: Diagnosis not present

## 2023-12-16 DIAGNOSIS — F17210 Nicotine dependence, cigarettes, uncomplicated: Secondary | ICD-10-CM | POA: Diagnosis not present

## 2023-12-16 DIAGNOSIS — J441 Chronic obstructive pulmonary disease with (acute) exacerbation: Secondary | ICD-10-CM | POA: Diagnosis not present

## 2023-12-16 DIAGNOSIS — E782 Mixed hyperlipidemia: Secondary | ICD-10-CM | POA: Diagnosis not present

## 2023-12-16 DIAGNOSIS — F331 Major depressive disorder, recurrent, moderate: Secondary | ICD-10-CM | POA: Diagnosis not present

## 2023-12-16 DIAGNOSIS — G894 Chronic pain syndrome: Secondary | ICD-10-CM | POA: Diagnosis not present

## 2023-12-16 DIAGNOSIS — I10 Essential (primary) hypertension: Secondary | ICD-10-CM | POA: Diagnosis not present

## 2023-12-16 DIAGNOSIS — F411 Generalized anxiety disorder: Secondary | ICD-10-CM | POA: Diagnosis not present

## 2023-12-16 DIAGNOSIS — N3281 Overactive bladder: Secondary | ICD-10-CM | POA: Diagnosis not present

## 2023-12-23 DIAGNOSIS — Z0100 Encounter for examination of eyes and vision without abnormal findings: Secondary | ICD-10-CM | POA: Diagnosis not present

## 2024-02-25 ENCOUNTER — Emergency Department (HOSPITAL_COMMUNITY)
Admission: EM | Admit: 2024-02-25 | Discharge: 2024-02-25 | Disposition: A | Discharge status: Home / Self Care | Attending: Emergency Medicine | Admitting: Emergency Medicine

## 2024-02-25 ENCOUNTER — Emergency Department (HOSPITAL_COMMUNITY)

## 2024-02-25 ENCOUNTER — Other Ambulatory Visit: Payer: Self-pay

## 2024-02-25 ENCOUNTER — Encounter (HOSPITAL_COMMUNITY): Payer: Self-pay

## 2024-02-25 DIAGNOSIS — S52611A Displaced fracture of right ulna styloid process, initial encounter for closed fracture: Secondary | ICD-10-CM | POA: Insufficient documentation

## 2024-02-25 DIAGNOSIS — W19XXXA Unspecified fall, initial encounter: Secondary | ICD-10-CM | POA: Diagnosis not present

## 2024-02-25 DIAGNOSIS — Z7982 Long term (current) use of aspirin: Secondary | ICD-10-CM | POA: Diagnosis not present

## 2024-02-25 DIAGNOSIS — M79631 Pain in right forearm: Secondary | ICD-10-CM | POA: Diagnosis present

## 2024-02-25 MED ORDER — HYDROMORPHONE HCL 1 MG/ML IJ SOLN
1.0000 mg | Freq: Once | INTRAMUSCULAR | Status: AC
  Administered 2024-02-25: 1 mg via INTRAMUSCULAR
  Filled 2024-02-25: qty 1

## 2024-02-25 NOTE — ED Provider Notes (Signed)
 [place] EMERGENCY DEPARTMENT AT [facility] Provider Note   CSN: [id number] Arrival date & time: 02/25/24  1239     Patient presents with: Ashley Doyle   Ashley Doyle is a 78 y.o. female.   Patient is a 78 year old female who is currently on hospice secondary to stage IV breast cancer who presents to the emergency department with a chief complaint of pain to the right forearm and right hand following a fall which occurred 2 days ago.  Patient notes that she is unsure of exactly how she fell but denies any associated syncope.  She denies striking her head or injuring her neck or back during the fall.  She denies any other long bone or joint pain.  She denies any chest pain or abdominal pain.   Fall       Prior to Admission medications   Medication Sig Start Date End Date Taking? Authorizing Provider  albuterol  (VENTOLIN  HFA) 108 (90 Base) MCG/ACT inhaler Inhale into the lungs every 6 (six) hours as needed for wheezing or shortness of breath.    [provider]  amLODipine (NORVASC) 5 MG tablet Take 5 mg by mouth daily. 10/23/23   [provider]  Artificial Tear Ointment (DRY EYES OP) Place 1 drop into both eyes daily as needed (for dry eyes).    [provider]  aspirin  EC 81 MG tablet Take 1 tablet (81 mg total) by mouth daily. 11/18/17   Charls Pearla LABOR, MD  atorvastatin  (LIPITOR) 40 MG tablet Take 1 tablet (40 mg total) by mouth daily. Pt needs to make appt with provider for additional refills - 2nd attempt 03/09/21   Johnson, Laymon HERO, PA-C  BREZTRI AEROSPHERE 160-9-4.8 MCG/ACT AERO inhaler Inhale 2 puffs into the lungs 2 (two) times daily. 09/05/23   [provider]  buPROPion  (WELLBUTRIN  SR) 150 MG 12 hr tablet Take 150 mg by mouth 2 (two) times daily.    [provider]  citalopram  (CELEXA ) 40 MG tablet Take 40 mg by mouth daily. 01/28/16   [provider]  docusate sodium  (COLACE) 100 MG capsule Take 100 mg by  mouth daily as needed for mild constipation.    [provider]  gabapentin  (NEURONTIN ) 300 MG capsule Take 1 capsule (300 mg total) by mouth 3 (three) times daily. 12/21/16   Letha Truman ORN, NP  levocetirizine (XYZAL) 5 MG tablet Take 5 mg by mouth daily. 12/04/22   [provider]  lisinopril (ZESTRIL) 20 MG tablet Take 20 mg by mouth daily.    [provider]  methocarbamol  (ROBAXIN ) 750 MG tablet Take 1 tablet (750 mg total) by mouth every 8 (eight) hours as needed (use for muscle cramps/pain). 11/23/16   Ebbie Cough, MD  metoprolol  tartrate (LOPRESSOR ) 25 MG tablet TAKE 1 TABLET BY MOUTH TWICE A DAY 08/03/19   Charls Pearla LABOR, MD  oxyCODONE -acetaminophen  (PERCOCET) 10-325 MG tablet Take 1 tablet by mouth every 8 (eight) hours as needed for pain.    [provider]  pantoprazole  (PROTONIX ) 40 MG tablet Take 40 mg by mouth daily. 11/05/22   [provider]  triamterene -hydrochlorothiazide  (MAXZIDE -25) 37.5-25 MG tablet Take 1 tablet by mouth daily. 02/02/16   [provider]  Vitamin D, Ergocalciferol, (DRISDOL) 1.25 MG (50000 UNIT) CAPS capsule Take 50,000 Units by mouth once a week. 07/16/23   [provider]    Allergies: Patient has no known allergies.    Review of Systems  Musculoskeletal:  Pain and swelling to right forearm and hand  All other systems reviewed and are negative.   Updated Vital Signs BP (!) 130/55   Pulse 70   Temp 97.8 F (36.6 C) (Oral)   Resp 18   Ht 5' 2 (1.575 m)   Wt 66.7 kg   SpO2 90%   BMI 26.89 kg/m   Physical Exam Vitals and nursing note reviewed.  Constitutional:      General: She is not in acute distress.    Appearance: Normal appearance. She is not ill-appearing.  HENT:     Head: Normocephalic and atraumatic.     Nose: Nose normal.     Mouth/Throat:     Mouth: Mucous membranes are moist.  Eyes:     Extraocular Movements: Extraocular movements intact.      Conjunctiva/sclera: Conjunctivae normal.     Pupils: Pupils are equal, round, and reactive to light.  Cardiovascular:     Rate and Rhythm: Normal rate and regular rhythm.     Pulses: Normal pulses.     Heart sounds: Normal heart sounds. No murmur heard.    No gallop.  Pulmonary:     Effort: Pulmonary effort is normal. No respiratory distress.     Breath sounds: No stridor. Wheezing present. No rhonchi or rales.  Abdominal:     General: Abdomen is flat. Bowel sounds are normal. There is no distension.     Palpations: Abdomen is soft.     Tenderness: There is no abdominal tenderness. There is no guarding.  Musculoskeletal:        General: Normal range of motion.     Cervical back: Normal range of motion and neck supple. No rigidity or tenderness.     Comments: Tenderness palpation noted over right forearm and right hand diffusely, edema and bruising noted over these areas, nontender palpation directly over elbow, shoulder, nontender palpation over left upper extremity, nontender palpation over bilateral lower extremities, pelvis stable to AP compression, peripheral pulses 2+ throughout, sensation intact distally throughout, full range of motion noted throughout, no obvious deformity, no skin breakdown or ulceration, no lacerations or abrasions, nontender palpation of the thoracic or lumbar spine  Skin:    General: Skin is warm and dry.     Findings: Bruising present. No rash.  Neurological:     General: No focal deficit present.     Mental Status: She is alert and oriented to person, place, and time. Mental status is at baseline.  Psychiatric:        Mood and Affect: Mood normal.        Behavior: Behavior normal.        Thought Content: Thought content normal.        Judgment: Judgment normal.     (all labs ordered are listed, but only abnormal results are displayed) Labs Reviewed - No data to display  EKG: None  Radiology: No results found.   Procedures   Medications  Ordered in the ED - No data to display                                  Medical Decision Making Patient is doing well at this time and is stable for discharge home.  Patient does have findings consistent with a right ulnar styloid fracture.  She will be placed in a volar splint and recommend close follow-up with orthopedics on outpatient basis.  She had no  other secondary sites of injury or pain noted on exam.  Do not suspect that any further advanced imaging is warranted.  She did not strike her head or injure her neck or back during the fall.  She had no long bone or joint tenderness on exam.  Strict turn precautions were provided for any new or worsening symptoms.  Patient voiced understanding and had no additional questions.  Amount and/or Complexity of Data Reviewed Radiology: ordered.  Risk Prescription drug management.        Final diagnoses:  None    ED Discharge Orders     None          Daralene Lonni JONETTA DEVONNA 02/25/24 1449    Dean Clarity, MD 02/25/24 1459

## 2024-02-25 NOTE — ED Notes (Signed)
 Patient has pulses in right wrist and patients fingers are purple and blue in color, patient has blue and purple bruising at forearm and has pain in right lower arm.

## 2024-02-25 NOTE — ED Triage Notes (Signed)
 Pt to er, states that pt is here for a fall Sunday, states that she is here for R arm pain and swelling,

## 2024-02-25 NOTE — Discharge Instructions (Signed)
 Please leave the splint in place until evaluated by orthopedics.  Please call to make an appointment with orthopedics.  Return to emergency department immediately for any new or worsening symptoms.
